# Patient Record
Sex: Female | Born: 1955 | Race: White | Hispanic: No | Marital: Married | Smoking: Never smoker
Health system: Southern US, Community
[De-identification: ages and names within clinical notes are randomized; demographics above are authoritative.]

## PROBLEM LIST (undated history)

## (undated) DIAGNOSIS — E119 Type 2 diabetes mellitus without complications: Secondary | ICD-10-CM

## (undated) DIAGNOSIS — M722 Plantar fascial fibromatosis: Secondary | ICD-10-CM

## (undated) DIAGNOSIS — I10 Essential (primary) hypertension: Secondary | ICD-10-CM

## (undated) DIAGNOSIS — R253 Fasciculation: Secondary | ICD-10-CM

## (undated) DIAGNOSIS — M1990 Unspecified osteoarthritis, unspecified site: Secondary | ICD-10-CM

## (undated) DIAGNOSIS — E785 Hyperlipidemia, unspecified: Secondary | ICD-10-CM

## (undated) DIAGNOSIS — E079 Disorder of thyroid, unspecified: Secondary | ICD-10-CM

## (undated) DIAGNOSIS — N200 Calculus of kidney: Secondary | ICD-10-CM

## (undated) DIAGNOSIS — K219 Gastro-esophageal reflux disease without esophagitis: Secondary | ICD-10-CM

## (undated) DIAGNOSIS — T7840XA Allergy, unspecified, initial encounter: Secondary | ICD-10-CM

## (undated) DIAGNOSIS — F32A Depression, unspecified: Secondary | ICD-10-CM

## (undated) HISTORY — DX: Unspecified osteoarthritis, unspecified site: M19.90

## (undated) HISTORY — DX: Calculus of kidney: N20.0

## (undated) HISTORY — DX: Hyperlipidemia, unspecified: E78.5

## (undated) HISTORY — DX: Fasciculation: R25.3

## (undated) HISTORY — PX: TUBAL LIGATION: SHX77

## (undated) HISTORY — DX: Type 2 diabetes mellitus without complications: E11.9

## (undated) HISTORY — DX: Allergy, unspecified, initial encounter: T78.40XA

## (undated) HISTORY — DX: Gastro-esophageal reflux disease without esophagitis: K21.9

## (undated) HISTORY — DX: Essential (primary) hypertension: I10

## (undated) HISTORY — PX: JOINT REPLACEMENT: SHX530

## (undated) HISTORY — DX: Plantar fascial fibromatosis: M72.2

## (undated) HISTORY — PX: PANNICULECTOMY: SUR1001

## (undated) HISTORY — DX: Depression, unspecified: F32.A

## (undated) HISTORY — DX: Disorder of thyroid, unspecified: E07.9

## (undated) HISTORY — PX: EYE SURGERY: SHX253

---
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
## 1993-01-14 HISTORY — PX: KNEE ARTHROSCOPY W/ MENISCAL REPAIR: SHX1877

## 1994-01-14 HISTORY — PX: BREAST REDUCTION SURGERY: SHX8

## 1994-01-14 HISTORY — PX: REDUCTION MAMMAPLASTY: SUR839

## 2001-01-14 HISTORY — PX: KNEE ARTHROSCOPY W/ MENISCAL REPAIR: SHX1877

## 2010-06-22 DIAGNOSIS — M79673 Pain in unspecified foot: Secondary | ICD-10-CM | POA: Insufficient documentation

## 2012-02-18 DIAGNOSIS — E119 Type 2 diabetes mellitus without complications: Secondary | ICD-10-CM | POA: Insufficient documentation

## 2013-01-11 DIAGNOSIS — M25561 Pain in right knee: Secondary | ICD-10-CM | POA: Insufficient documentation

## 2014-05-10 LAB — HM COLONOSCOPY

## 2017-01-14 HISTORY — PX: REPLACEMENT TOTAL KNEE: SUR1224

## 2017-06-16 DIAGNOSIS — M48061 Spinal stenosis, lumbar region without neurogenic claudication: Secondary | ICD-10-CM | POA: Insufficient documentation

## 2020-02-15 ENCOUNTER — Other Ambulatory Visit: Payer: Self-pay

## 2020-02-15 ENCOUNTER — Encounter: Payer: Self-pay | Admitting: Family Medicine

## 2020-02-15 ENCOUNTER — Ambulatory Visit (INDEPENDENT_AMBULATORY_CARE_PROVIDER_SITE_OTHER): Payer: Medicare HMO | Admitting: Family Medicine

## 2020-02-15 VITALS — BP 102/62 | HR 67 | Temp 96.4°F | Ht 66.0 in | Wt 213.2 lb

## 2020-02-15 DIAGNOSIS — Z1231 Encounter for screening mammogram for malignant neoplasm of breast: Secondary | ICD-10-CM

## 2020-02-15 DIAGNOSIS — E782 Mixed hyperlipidemia: Secondary | ICD-10-CM

## 2020-02-15 DIAGNOSIS — M5136 Other intervertebral disc degeneration, lumbar region: Secondary | ICD-10-CM | POA: Diagnosis not present

## 2020-02-15 DIAGNOSIS — R253 Fasciculation: Secondary | ICD-10-CM | POA: Diagnosis not present

## 2020-02-15 DIAGNOSIS — K219 Gastro-esophageal reflux disease without esophagitis: Secondary | ICD-10-CM | POA: Diagnosis not present

## 2020-02-15 DIAGNOSIS — Z13820 Encounter for screening for osteoporosis: Secondary | ICD-10-CM

## 2020-02-15 DIAGNOSIS — E1159 Type 2 diabetes mellitus with other circulatory complications: Secondary | ICD-10-CM

## 2020-02-15 DIAGNOSIS — H179 Unspecified corneal scar and opacity: Secondary | ICD-10-CM | POA: Diagnosis not present

## 2020-02-15 DIAGNOSIS — Z780 Asymptomatic menopausal state: Secondary | ICD-10-CM

## 2020-02-15 DIAGNOSIS — Z23 Encounter for immunization: Secondary | ICD-10-CM

## 2020-02-15 DIAGNOSIS — R69 Illness, unspecified: Secondary | ICD-10-CM | POA: Diagnosis not present

## 2020-02-15 DIAGNOSIS — E118 Type 2 diabetes mellitus with unspecified complications: Secondary | ICD-10-CM

## 2020-02-15 DIAGNOSIS — F411 Generalized anxiety disorder: Secondary | ICD-10-CM

## 2020-02-15 DIAGNOSIS — I152 Hypertension secondary to endocrine disorders: Secondary | ICD-10-CM

## 2020-02-15 MED ORDER — POTASSIUM CHLORIDE ER 10 MEQ PO TBCR
10.0000 meq | EXTENDED_RELEASE_TABLET | Freq: Once | ORAL | 0 refills | Status: DC
Start: 2020-02-15 — End: 2021-01-12

## 2020-02-15 MED ORDER — GLIMEPIRIDE 1 MG PO TABS: 1.0000 mg | ORAL_TABLET | Freq: Every day | ORAL | 1 refills | Status: DC

## 2020-02-15 MED ORDER — BUSPIRONE HCL 5 MG PO TABS: 5.0000 mg | ORAL_TABLET | Freq: Two times a day (BID) | ORAL | 1 refills | Status: DC

## 2020-02-15 MED ORDER — CLONAZEPAM 0.5 MG PO TABS: 0.5000 mg | ORAL_TABLET | Freq: Every day | ORAL | 0 refills | Status: DC | PRN

## 2020-02-15 MED ORDER — ESCITALOPRAM OXALATE 10 MG PO TABS: 10.0000 mg | ORAL_TABLET | Freq: Every day | ORAL | 1 refills | Status: DC

## 2020-02-15 MED ORDER — CELECOXIB 200 MG PO CAPS: 200.0000 mg | ORAL_CAPSULE | Freq: Two times a day (BID) | ORAL | 1 refills | Status: DC

## 2020-02-15 MED ORDER — GABAPENTIN 100 MG PO CAPS: 200.0000 mg | ORAL_CAPSULE | Freq: Two times a day (BID) | ORAL | 3 refills | Status: DC

## 2020-02-15 NOTE — Patient Instructions (Signed)
[facility].  Ordering Mammogram and Bone Density.

## 2020-02-15 NOTE — Progress Notes (Signed)
New Patient Office Visit  Subjective:  Patient ID: Anna Greene, female    DOB: Jul 24, 1955  Age: 65 y.o. MRN: [id number]  CC:  Chief Complaint  Patient presents with  . Hypertension  . Diabetes  . Depression    HPI Anna Greene presents to establish care.  [place] moved in state. Diabetes: 105-116. A1C 5.7-6.5. Intolerant to metformin. On cinnamon. Glimeperide 1 mg 1/2 daily. Checks feet daily. Needs referral [facility], wants to see an ophthalmologist. Hypertension: On atenolol-chlorthalidone, losartan 50 mg once daily.  DDD - celebrex 200 mg twice daily.  The patient is seeing Dr. Turner Daniels. TKR on right. 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  GAD: Lexapro 10 mg once daily, Buspar 5 mg twice daily, and clonazepam 0.5 as needed. Hyperlipidemia: Rosuvastatin 5 mg once daily The patient's last physical was in 2018.  Her last mammogram was 2019.  She last had a colonoscopy in 2013.  Her last Pap smear was 2018.  She is up-to-date on her Prevnar 40, Fluad 2021, and tetanus 2015.  Past Medical History:  Diagnosis Date  . Benign fasciculation-cramp syndrome   . Depression   . Diabetes mellitus without complication (HCC)   . GERD (gastroesophageal reflux disease)   . Hyperlipidemia   . Hypertension   . Nephrolithiasis   . Osteoarthritis    Bilateral knees, degenerative disc disease.  Patient has had epidural steroid injections x2  . Plantar fasciitis     Past Surgical History:  Procedure Laterality Date  . BREAST REDUCTION SURGERY Bilateral 1996  . CESAREAN SECTION  1976  . CESAREAN SECTION  1979  . KNEE ARTHROSCOPY W/ MENISCAL REPAIR Left 1995   Per patient muscle was repaired  . KNEE ARTHROSCOPY W/ MENISCAL REPAIR Right 2003  . PANNICULECTOMY    . REPLACEMENT TOTAL KNEE Right 2019  . TONSILLECTOMY  1960    Family History  Problem Relation Age of Onset  . Stroke Mother   . Heart disease Mother   . Cancer Father   .  Diabetes Sister   . Stroke Brother   . Anxiety disorder Sister   . Depression Sister   . Diabetes Sister   . Diabetes Sister   . Arthritis Sister     Social History   Socioeconomic History  . Marital status: Married    Spouse name: Not on file  . Number of children: Not on file  . Years of education: Not on file  . Highest education level: Not on file  Occupational History  . Not on file  Tobacco Use  . Smoking status: Never Smoker  . Smokeless tobacco: Never Used  Vaping Use  . Vaping Use: Never used  Substance and Sexual Activity  . Alcohol use: Never  . Drug use: Never  . Sexual activity: Not on file  Other Topics Concern  . Not on file  Social History Narrative   Patient is married.  Has 2 adult children.   Social Determinants of Health   Financial Resource Strain: Not on file  Food Insecurity: Not on file  Transportation Needs: Not on file  Physical Activity: Not on file  Stress: Not on file  Social Connections: Not on file  Intimate Partner Violence: Not on file    ROS Review of Systems  Constitutional: Negative for fatigue.  HENT: Negative for congestion, ear pain and sore throat.   Eyes: Positive for visual disturbance (Blurry vision from scars on her corneas.).  Respiratory: Negative for cough and shortness of breath.   Cardiovascular: Negative for chest pain.  Gastrointestinal: Negative for abdominal pain, constipation, diarrhea, nausea and vomiting.  Genitourinary: Negative for dysuria, frequency and urgency.  Musculoskeletal: Positive for arthralgias, back pain and myalgias.  Neurological: Negative for dizziness and headaches.  Psychiatric/Behavioral: Negative for agitation and sleep disturbance. The patient is nervous/anxious.     Objective:   Today's Vitals: BP 102/62 (BP Location: Right Arm, Patient Position: Sitting, Cuff Size: Normal)   Pulse 67   Temp (!) 96.4 F (35.8 C) (Temporal)   Ht 5\' 6"  (1.676 m)   Wt 213 lb 3.2 oz (96.7 kg)    SpO2 96%   BMI 34.41 kg/m   Physical Exam Vitals reviewed.  Constitutional:      Appearance: Normal appearance.  Cardiovascular:     Rate and Rhythm: Normal rate and regular rhythm.     Heart sounds: Normal heart sounds.  Pulmonary:     Effort: Pulmonary effort is normal.     Breath sounds: Normal breath sounds.  Abdominal:     General: Bowel sounds are normal.     Tenderness: There is no abdominal tenderness.  Musculoskeletal:        General: Normal range of motion.  Skin:    General: Skin is warm and dry.  Neurological:     Mental Status: She is alert and oriented to person, place, and time.  Psychiatric:        Mood and Affect: Mood normal.        Behavior: Behavior normal.     Assessment & Plan:  1. Controlled type 2 diabetes mellitus with complication, without long-term current use of insulin (HCC) Control: good Recommend check sugars fasting daily. Recommend check feet daily. Recommend annual eye exams. Medicines: no changes at this time.  Continue to work on eating a healthy diet and exercise.  Labs to be drawn next week. - Ambulatory referral to Ophthalmology - Hemoglobin A1c; Future  2. GAD (generalized anxiety disorder) Continue current medications.  Well-controlled - clonazePAM (KLONOPIN) 0.5 MG tablet; Take 1 tablet (0.5 mg total) by mouth daily as needed.  Dispense: 90 tablet; Refill: 0 - busPIRone (BUSPAR) 5 MG tablet; Take 1 tablet (5 mg total) by mouth 2 (two) times daily.  Dispense: 180 tablet; Refill: 1 - escitalopram (LEXAPRO) 10 MG tablet; Take 1 tablet (10 mg total) by mouth daily.  Dispense: 90 tablet; Refill: 1  3. Benign fasciculations Continue current medications. - gabapentin (NEURONTIN) 100 MG capsule; Take 2 capsules (200 mg total) by mouth 2 (two) times daily.  Dispense: 360 capsule; Refill: 3  4. Hypertension associated with type 2 diabetes mellitus (HCC) Blood pressure appears to be well controlled on current medications.  No changes.   Continue eating healthy and exercise as able. - atenolol-chlorthalidone (TENORETIC) 100-25 MG tablet; Take 1 tablet by mouth daily. - losartan (COZAAR) 50 MG tablet; Take by mouth. - potassium chloride (KLOR-CON) 10 MEQ tablet; Take 1 tablet (10 mEq total) by mouth once for 1 dose.  Dispense: 1 tablet; Refill: 0 - glimepiride (AMARYL) 1 MG tablet; Take 1 tablet (1 mg total) by mouth daily with breakfast.  Dispense: 90 tablet; Refill: 1 - CBC with Differential/Platelet; Future - Comprehensive metabolic panel; Future - TSH; Future  5. Mixed hyperlipidemia For now continue current medications.  Will be checking blood work this coming week and will consider changes if necessary. - rosuvastatin (CRESTOR) 5 MG tablet; Take by mouth. - Lipid panel;  Future  6. Gastroesophageal reflux disease without esophagitis Well-controlled on omeprazole.  No changes. - omeprazole (PRILOSEC) 20 MG capsule; Take 20 mg by mouth daily.  7. Corneal scarring Refer to [facility]. - Ambulatory referral to Ophthalmology  8. Degenerative disc disease, lumbar Continue Celebrex. - celecoxib (CELEBREX) 200 MG capsule; Take 1 capsule (200 mg total) by mouth 2 (two) times daily.  Dispense: 180 capsule; Refill: 1  9. Encounter for osteoporosis screening in asymptomatic postmenopausal patient Order bone density.  10. Need for vaccination against Streptococcus pneumoniae - Pneumococcal polysaccharide vaccine 23-valent greater than or equal to 2yo subcutaneous/IM  11. Visit for screening mammogram Order mammogram    Outpatient Encounter Medications as of 02/15/2020  Medication Sig  . atenolol-chlorthalidone (TENORETIC) 100-25 MG tablet Take 1 tablet by mouth daily.  Marland Kitchen loratadine (CLARITIN) 10 MG tablet Take by mouth.  . losartan (COZAAR) 50 MG tablet Take by mouth.  Marland Kitchen omeprazole (PRILOSEC) 20 MG capsule Take 20 mg by mouth daily.  . rosuvastatin (CRESTOR) 5 MG tablet Take by mouth.  . [DISCONTINUED] busPIRone  (BUSPAR) 5 MG tablet Take by mouth.  . [DISCONTINUED] celecoxib (CELEBREX) 200 MG capsule Take by mouth.  . [DISCONTINUED] clonazePAM (KLONOPIN) 0.5 MG tablet Take 1 tablet by mouth as needed.  . [DISCONTINUED] escitalopram (LEXAPRO) 10 MG tablet Take by mouth.  . [DISCONTINUED] gabapentin (NEURONTIN) 100 MG capsule Take by mouth.  . [DISCONTINUED] glimepiride (AMARYL) 1 MG tablet Take by mouth.  . [DISCONTINUED] potassium chloride (KLOR-CON) 10 MEQ tablet Take by mouth.  . busPIRone (BUSPAR) 5 MG tablet Take 1 tablet (5 mg total) by mouth 2 (two) times daily.  . celecoxib (CELEBREX) 200 MG capsule Take 1 capsule (200 mg total) by mouth 2 (two) times daily.  . clonazePAM (KLONOPIN) 0.5 MG tablet Take 1 tablet (0.5 mg total) by mouth daily as needed.  Marland Kitchen escitalopram (LEXAPRO) 10 MG tablet Take 1 tablet (10 mg total) by mouth daily.  Marland Kitchen gabapentin (NEURONTIN) 100 MG capsule Take 2 capsules (200 mg total) by mouth 2 (two) times daily.  Marland Kitchen glimepiride (AMARYL) 1 MG tablet Take 1 tablet (1 mg total) by mouth daily with breakfast.  . potassium chloride (KLOR-CON) 10 MEQ tablet Take 1 tablet (10 mEq total) by mouth once for 1 dose.   No facility-administered encounter medications on file as of 02/15/2020.    Follow-up: No follow-ups on file.   Blane Ohara, MD

## 2020-02-16 ENCOUNTER — Other Ambulatory Visit: Payer: Self-pay | Admitting: Family Medicine

## 2020-02-16 DIAGNOSIS — Z780 Asymptomatic menopausal state: Secondary | ICD-10-CM

## 2020-02-16 DIAGNOSIS — Z13820 Encounter for screening for osteoporosis: Secondary | ICD-10-CM

## 2020-02-16 DIAGNOSIS — Z1231 Encounter for screening mammogram for malignant neoplasm of breast: Secondary | ICD-10-CM

## 2020-02-18 DIAGNOSIS — M810 Age-related osteoporosis without current pathological fracture: Secondary | ICD-10-CM | POA: Insufficient documentation

## 2020-02-18 DIAGNOSIS — E785 Hyperlipidemia, unspecified: Secondary | ICD-10-CM | POA: Insufficient documentation

## 2020-02-18 DIAGNOSIS — I10 Essential (primary) hypertension: Secondary | ICD-10-CM | POA: Insufficient documentation

## 2020-02-18 DIAGNOSIS — F418 Other specified anxiety disorders: Secondary | ICD-10-CM | POA: Insufficient documentation

## 2020-02-18 DIAGNOSIS — I152 Hypertension secondary to endocrine disorders: Secondary | ICD-10-CM | POA: Insufficient documentation

## 2020-02-18 DIAGNOSIS — E1159 Type 2 diabetes mellitus with other circulatory complications: Secondary | ICD-10-CM | POA: Insufficient documentation

## 2020-02-18 DIAGNOSIS — E119 Type 2 diabetes mellitus without complications: Secondary | ICD-10-CM | POA: Insufficient documentation

## 2020-02-18 DIAGNOSIS — E1169 Type 2 diabetes mellitus with other specified complication: Secondary | ICD-10-CM | POA: Insufficient documentation

## 2020-02-20 ENCOUNTER — Encounter: Payer: Self-pay | Admitting: Family Medicine

## 2020-02-21 ENCOUNTER — Other Ambulatory Visit: Payer: Self-pay

## 2020-02-21 ENCOUNTER — Other Ambulatory Visit: Payer: Medicare HMO

## 2020-02-21 DIAGNOSIS — E1159 Type 2 diabetes mellitus with other circulatory complications: Secondary | ICD-10-CM

## 2020-02-21 DIAGNOSIS — I152 Hypertension secondary to endocrine disorders: Secondary | ICD-10-CM | POA: Diagnosis not present

## 2020-02-21 DIAGNOSIS — E782 Mixed hyperlipidemia: Secondary | ICD-10-CM

## 2020-02-21 DIAGNOSIS — E118 Type 2 diabetes mellitus with unspecified complications: Secondary | ICD-10-CM | POA: Diagnosis not present

## 2020-02-22 LAB — CBC WITH DIFF/PLATELET
Basophils Absolute: 0.1 10*3/uL (ref 0.0–0.2)
Basos: 1 %
EOS (ABSOLUTE): 0.3 10*3/uL (ref 0.0–0.4)
Eos: 4 %
Hematocrit: 41.1 % (ref 34.0–46.6)
Hemoglobin: 13.6 g/dL (ref 11.1–15.9)
Immature Grans (Abs): 0 10*3/uL (ref 0.0–0.1)
Immature Granulocytes: 0 %
Lymphocytes Absolute: 2 10*3/uL (ref 0.7–3.1)
Lymphs: 32 %
MCH: 28.4 pg (ref 26.6–33.0)
MCHC: 33.1 g/dL (ref 31.5–35.7)
MCV: 86 fL (ref 79–97)
Monocytes Absolute: 0.6 10*3/uL (ref 0.1–0.9)
Monocytes: 9 %
Neutrophils Absolute: 3.3 10*3/uL (ref 1.4–7.0)
Neutrophils: 54 %
Platelets: 272 10*3/uL (ref 150–450)
RBC: 4.79 x10E6/uL (ref 3.77–5.28)
RDW: 13.2 % (ref 11.7–15.4)
WBC: 6.2 10*3/uL (ref 3.4–10.8)

## 2020-02-22 LAB — LIPID PANEL
Chol/HDL Ratio: 2.7 ratio (ref 0.0–4.4)
Cholesterol, Total: 154 mg/dL (ref 100–199)
HDL: 58 mg/dL (ref >39)
LDL Chol Calc (NIH): 73 mg/dL (ref 0–99)
Triglycerides: 131 mg/dL (ref 0–149)
VLDL Cholesterol Cal: 23 mg/dL (ref 5–40)

## 2020-02-22 LAB — TSH: TSH: 0.948 u[IU]/mL (ref 0.450–4.500)

## 2020-02-22 LAB — HEMOGLOBIN A1C
Est. average glucose Bld gHb Est-mCnc: 126 mg/dL
Hgb A1c MFr Bld: 6 % — ABNORMAL HIGH (ref 4.8–5.6)

## 2020-02-22 LAB — COMPREHENSIVE METABOLIC PANEL
ALT: 17 IU/L (ref 0–32)
AST: 23 IU/L (ref 0–40)
Albumin/Globulin Ratio: 2 (ref 1.2–2.2)
Albumin: 4.5 g/dL (ref 3.8–4.8)
Alkaline Phosphatase: 89 IU/L (ref 44–121)
BUN/Creatinine Ratio: 23 (ref 12–28)
BUN: 19 mg/dL (ref 8–27)
Bilirubin Total: 0.4 mg/dL (ref 0.0–1.2)
CO2: 27 mmol/L (ref 20–29)
Calcium: 9.5 mg/dL (ref 8.7–10.3)
Chloride: 96 mmol/L (ref 96–106)
Creatinine, Ser: 0.83 mg/dL (ref 0.57–1.00)
GFR calc Af Amer: 85 mL/min/{1.73_m2} (ref >59)
GFR calc non Af Amer: 74 mL/min/{1.73_m2} (ref >59)
Globulin, Total: 2.3 g/dL (ref 1.5–4.5)
Glucose: 110 mg/dL — ABNORMAL HIGH (ref 65–99)
Potassium: 3.8 mmol/L (ref 3.5–5.2)
Sodium: 139 mmol/L (ref 134–144)
Total Protein: 6.8 g/dL (ref 6.0–8.5)

## 2020-02-22 LAB — CARDIOVASCULAR RISK ASSESSMENT

## 2020-04-10 ENCOUNTER — Ambulatory Visit
Admission: RE | Admit: 2020-04-10 | Discharge: 2020-04-10 | Disposition: A | Discharge status: Home / Self Care | Payer: Medicare HMO | Source: Ambulatory Visit | Attending: Family Medicine | Admitting: Family Medicine

## 2020-04-10 ENCOUNTER — Other Ambulatory Visit: Payer: Self-pay

## 2020-04-10 DIAGNOSIS — Z1231 Encounter for screening mammogram for malignant neoplasm of breast: Secondary | ICD-10-CM | POA: Diagnosis not present

## 2020-04-17 ENCOUNTER — Ambulatory Visit: Payer: Medicare HMO | Admitting: Family Medicine

## 2020-04-27 NOTE — Progress Notes (Signed)
Subjective:  Patient ID: Anna Greene, female    DOB: 12/20/1955  Age: 65 y.o. MRN: [id number]  Chief Complaint  Patient presents with  . Annual Exam    HPI Encounter for general adult medical examination without abnormal findings  Physical ("At Risk" items are starred): Patient's last physical exam was 1 year ago .  Smoking: Life-long non-smoker ;  Physical Activity: Exercises at least 3 times per week ; Walking and riding stationary bike Alcohol/Drug Use: Is a non-drinker ; No illicit drug use ;  Patient is not afflicted from Stress Incontinence and Urge Incontinence  Safety: reviewed ; Patient wears a seat belt, has smoke detectors, has carbon monoxide detectors, practices appropriate gun safety, and wears sunscreen with extended sun exposure. Dental Care: biannual cleanings, brushes and flosses daily. Needs to get established with a dentist.  Ophthalmology/Optometry: Annual visit. Has appt today with Dr. Cecile Hearing. Hearing loss: none Vision impairments: none  DEXA- scheduled for 06/2020   Mammogram- 03/2020-Normal  Fall Risk  05/01/2020 05/01/2020  Falls in the past year? - 0  Number falls in past yr: - 0  Injury with Fall? - 0  Risk for fall due to : No Fall Risks No Fall Risks  Follow up Falls evaluation completed;Falls prevention discussed Falls evaluation completed     Depression screen [facility] 2/9 05/01/2020 02/20/2020 02/15/2020  Decreased Interest 0 - 0  Down, Depressed, Hopeless 0 - 0  PHQ - 2 Score 0 - 0  Difficult doing work/chores - Not difficult at all -       Functional Status Survey: Is the patient deaf or have difficulty hearing?: No Does the patient have difficulty seeing, even when wearing glasses/contacts?: No Does the patient have difficulty concentrating, remembering, or making decisions?: No Does the patient have difficulty walking or climbing stairs?: No Does the patient have difficulty dressing or bathing?: No Does the patient have difficulty doing errands  alone such as visiting a doctor's office or shopping?: No   Social Hx   Social History   Socioeconomic History  . Marital status: Married    Spouse name: Not on file  . Number of children: Not on file  . Years of education: Not on file  . Highest education level: Not on file  Occupational History  . Not on file  Tobacco Use  . Smoking status: Never Smoker  . Smokeless tobacco: Never Used  Vaping Use  . Vaping Use: Never used  Substance and Sexual Activity  . Alcohol use: Never  . Drug use: Never  . Sexual activity: Not on file  Other Topics Concern  . Not on file  Social History Narrative   Patient is married.  Has 2 adult children.   Social Determinants of Health   Financial Resource Strain: Not on file  Food Insecurity: Not on file  Transportation Needs: Not on file  Physical Activity: Not on file  Stress: Not on file  Social Connections: Not on file   Past Medical History:  Diagnosis Date  . Benign fasciculation-cramp syndrome   . Depression   . Diabetes mellitus without complication (HCC)   . GERD (gastroesophageal reflux disease)   . Hyperlipidemia   . Hypertension   . Nephrolithiasis   . Osteoarthritis    Bilateral knees, degenerative disc disease.  Patient has had epidural steroid injections x2  . Plantar fasciitis    Family History  Problem Relation Age of Onset  . Stroke Mother   . Heart disease Mother   .  Cancer Father   . Diabetes Sister   . Stroke Brother   . Anxiety disorder Sister   . Depression Sister   . Diabetes Sister   . Diabetes Sister   . Arthritis Sister     Review of Systems  Constitutional: Negative for chills, fatigue and fever.  HENT: Negative for congestion, ear pain, rhinorrhea and sore throat.   Respiratory: Negative for cough and shortness of breath.   Cardiovascular: Negative for chest pain.  Gastrointestinal: Negative for abdominal pain, constipation, diarrhea, nausea and vomiting.  Genitourinary: Negative for  dysuria and urgency.  Musculoskeletal: Negative for back pain and myalgias.  Neurological: Negative for dizziness, weakness, light-headedness and headaches.  Psychiatric/Behavioral: Negative for dysphoric mood. The patient is not nervous/anxious.      Objective:  BP 122/72   Pulse 68   Temp (!) 97.2 F (36.2 C)   Ht 5\' 6"  (1.676 m)   Wt 218 lb (98.9 kg)   SpO2 97%   BMI 35.19 kg/m   BP/Weight 05/01/2020 02/15/2020  Systolic BP 122 102  Diastolic BP 72 62  Wt. (Lbs) 218 213.2  BMI 35.19 34.41    Physical Exam Vitals reviewed. Exam conducted with a chaperone present.  Constitutional:      Appearance: Normal appearance. She is normal weight.  Neck:     Vascular: No carotid bruit.  Cardiovascular:     Rate and Rhythm: Normal rate and regular rhythm.     Pulses: Normal pulses.     Heart sounds: Normal heart sounds.  Pulmonary:     Effort: Pulmonary effort is normal. No respiratory distress.     Breath sounds: Normal breath sounds.  Abdominal:     General: Abdomen is flat. Bowel sounds are normal.     Palpations: Abdomen is soft.     Tenderness: There is no abdominal tenderness.  Genitourinary:    General: Normal vulva.     Exam position: Lithotomy position.     Vagina: Lesions (difuse mild atrophy. ) present.     Cervix: Normal.  Skin:    Findings: No lesion or rash.  Neurological:     Mental Status: She is alert and oriented to person, place, and time.  Psychiatric:        Mood and Affect: Mood normal.        Behavior: Behavior normal.     Lab Results  Component Value Date   WBC 6.2 02/21/2020   HGB 13.6 02/21/2020   HCT 41.1 02/21/2020   PLT 272 02/21/2020   GLUCOSE 110 (H) 02/21/2020   CHOL 154 02/21/2020   TRIG 131 02/21/2020   HDL 58 02/21/2020   LDLCALC 73 02/21/2020   ALT 17 02/21/2020   AST 23 02/21/2020   NA 139 02/21/2020   K 3.8 02/21/2020   CL 96 02/21/2020   CREATININE 0.83 02/21/2020   BUN 19 02/21/2020   CO2 27 02/21/2020   TSH 0.948  02/21/2020   HGBA1C 6.0 (H) 02/21/2020      Assessment & Plan:  1. General medical exam Healthy female.  The current medical regimen is effective;  continue present plan and medications. Recommend continue to work on eating healthy diet and exercise.  2. Cervical cancer screening - IGP, Aptima HPV, rfx 16/18,45  3. Mixed hyperlipidemia - Lipid panel; Future  4. Hypertension associated with type 2 diabetes mellitus (HCC) - CBC with Differential/Platelet; Future - Comprehensive metabolic panel; Future - Hemoglobin A1c; Future    Meds ordered this encounter  Medications  . conjugated estrogens (PREMARIN) vaginal cream    Sig: Place 1 Applicatorful vaginally daily for 14 days, THEN 1 Applicatorful every 3 (three) days for 14 days.    Dispense:  42.5 g    Refill:  12    These are the goals we discussed: Goals   None      This is a list of the screening recommended for you and due dates:  Health Maintenance  Topic Date Due  .  Hepatitis C: One time screening is recommended by Center for Disease Control  (CDC) for  adults born from [birth year] through 1967.   Never done  . Eye exam for diabetics  Never done  . Colon Cancer Screening  Never done  . DEXA scan (bone density measurement)  Never done  . Flu Shot  08/14/2020  . Hemoglobin A1C  08/20/2020  . Complete foot exam   02/14/2021  . Mammogram  04/11/2022  . Tetanus Vaccine  02/16/2023  . COVID-19 Vaccine  Completed  . Pneumonia vaccines  Completed  . HPV Vaccine  Aged Out     AN INDIVIDUALIZED CARE PLAN: was established or reinforced today.   SELF MANAGEMENT: The patient and I together assessed ways to personally work towards obtaining the recommended goals  Support needs The patient and/or family needs were assessed and services were offered and not necessary at this time.    Follow-up: Return in about 6 months (around 10/31/2020).  Blane Ohara, MD [facility] [phone]

## 2020-05-01 ENCOUNTER — Other Ambulatory Visit: Payer: Self-pay

## 2020-05-01 ENCOUNTER — Encounter: Payer: Self-pay | Admitting: Family Medicine

## 2020-05-01 ENCOUNTER — Ambulatory Visit (INDEPENDENT_AMBULATORY_CARE_PROVIDER_SITE_OTHER): Payer: Medicare HMO | Admitting: Family Medicine

## 2020-05-01 VITALS — BP 122/72 | HR 68 | Temp 97.2°F | Ht 66.0 in | Wt 218.0 lb

## 2020-05-01 DIAGNOSIS — M545 Low back pain, unspecified: Secondary | ICD-10-CM | POA: Insufficient documentation

## 2020-05-01 DIAGNOSIS — E782 Mixed hyperlipidemia: Secondary | ICD-10-CM

## 2020-05-01 DIAGNOSIS — Z01818 Encounter for other preprocedural examination: Secondary | ICD-10-CM | POA: Diagnosis not present

## 2020-05-01 DIAGNOSIS — M5136 Other intervertebral disc degeneration, lumbar region: Secondary | ICD-10-CM | POA: Insufficient documentation

## 2020-05-01 DIAGNOSIS — H11001 Unspecified pterygium of right eye: Secondary | ICD-10-CM | POA: Diagnosis not present

## 2020-05-01 DIAGNOSIS — H18453 Nodular corneal degeneration, bilateral: Secondary | ICD-10-CM | POA: Diagnosis not present

## 2020-05-01 DIAGNOSIS — M5416 Radiculopathy, lumbar region: Secondary | ICD-10-CM | POA: Insufficient documentation

## 2020-05-01 DIAGNOSIS — M179 Osteoarthritis of knee, unspecified: Secondary | ICD-10-CM | POA: Insufficient documentation

## 2020-05-01 DIAGNOSIS — M1991 Primary osteoarthritis, unspecified site: Secondary | ICD-10-CM | POA: Insufficient documentation

## 2020-05-01 DIAGNOSIS — Z124 Encounter for screening for malignant neoplasm of cervix: Secondary | ICD-10-CM | POA: Diagnosis not present

## 2020-05-01 DIAGNOSIS — E119 Type 2 diabetes mellitus without complications: Secondary | ICD-10-CM | POA: Diagnosis not present

## 2020-05-01 DIAGNOSIS — E1159 Type 2 diabetes mellitus with other circulatory complications: Secondary | ICD-10-CM

## 2020-05-01 DIAGNOSIS — I152 Hypertension secondary to endocrine disorders: Secondary | ICD-10-CM | POA: Diagnosis not present

## 2020-05-01 DIAGNOSIS — M1710 Unilateral primary osteoarthritis, unspecified knee: Secondary | ICD-10-CM | POA: Insufficient documentation

## 2020-05-01 DIAGNOSIS — Z0000 Encounter for general adult medical examination without abnormal findings: Secondary | ICD-10-CM | POA: Diagnosis not present

## 2020-05-01 DIAGNOSIS — H2513 Age-related nuclear cataract, bilateral: Secondary | ICD-10-CM | POA: Diagnosis not present

## 2020-05-01 MED ORDER — PREMARIN 0.625 MG/GM VA CREA: TOPICAL_CREAM | VAGINAL | 12 refills | Status: AC

## 2020-05-01 NOTE — Patient Instructions (Addendum)
[facility] recommended. Recommend continue to work on eating healthy diet and exercise. Obtain Living well or [place] of attorney.  Premarin cream apply once daily x 2 weeks then decrease to 1-2 times per week.   Preventive Care 33 Years and Older, Female Preventive care refers to lifestyle choices and visits with your health care provider that can promote health and wellness. This includes:  A yearly physical exam. This is also called an annual wellness visit.  Regular dental and eye exams.  Immunizations.  Screening for certain conditions.  Healthy lifestyle choices, such as: ? Eating a healthy diet. ? Getting regular exercise. ? Not using drugs or products that contain nicotine and tobacco. ? Limiting alcohol use. What can I expect for my preventive care visit? Physical exam Your health care provider will check your:  Height and weight. These may be used to calculate your BMI (body mass index). BMI is a measurement that tells if you are at a healthy weight.  Heart rate and blood pressure.  Body temperature.  Skin for abnormal spots. Counseling Your health care provider may ask you questions about your:  Past medical problems.  Family's medical history.  Alcohol, tobacco, and drug use.  Emotional well-being.  Home life and relationship well-being.  Sexual activity.  Diet, exercise, and sleep habits.  History of falls.  Memory and ability to understand (cognition).  Work and work Statistician.  Pregnancy and menstrual history.  Access to firearms. What immunizations do I need? Vaccines are usually given at various ages, according to a schedule. Your health care provider will recommend vaccines for you based on your age, medical history, and lifestyle or other factors, such as travel or where you work.   What tests do I need? Blood tests  Lipid and cholesterol levels. These may be checked every 5 years, or more often depending on your  overall health.  Hepatitis C test.  Hepatitis B test. Screening  Lung cancer screening. You may have this screening every year starting at age 44 if you have a 30-pack-year history of smoking and currently smoke or have quit within the past 15 years.  Colorectal cancer screening. ? All adults should have this screening starting at age 57 and continuing until age 70. ? Your health care provider may recommend screening at age 62 if you are at increased risk. ? You will have tests every 1-10 years, depending on your results and the type of screening test.  Diabetes screening. ? This is done by checking your blood sugar (glucose) after you have not eaten for a while (fasting). ? You may have this done every 1-3 years.  Mammogram. ? This may be done every 1-2 years. ? Talk with your health care provider about how often you should have regular mammograms.  Abdominal aortic aneurysm (AAA) screening. You may need this if you are a current or former smoker.  BRCA-related cancer screening. This may be done if you have a family history of breast, ovarian, tubal, or peritoneal cancers. Other tests  STD (sexually transmitted disease) testing, if you are at risk.  Bone density scan. This is done to screen for osteoporosis. You may have this done starting at age 64. Talk with your health care provider about your test results, treatment options, and if necessary, the need for more tests. Follow these instructions at home: Eating and drinking  Eat a diet that includes fresh fruits and vegetables, whole grains, lean protein, and low-fat dairy products. Limit your intake  of foods with high amounts of sugar, saturated fats, and salt.  Take vitamin and mineral supplements as recommended by your health care provider.  Do not drink alcohol if your health care provider tells you not to drink.  If you drink alcohol: ? Limit how much you have to 0-1 drink a day. ? Be aware of how much alcohol is in  your drink. In the [place] one drink equals one 12 oz bottle of beer (355 mL), one 5 oz glass of wine (148 mL), or one 1 oz glass of hard liquor (44 mL).   Lifestyle  Take daily care of your teeth and gums. Brush your teeth every morning and night with fluoride toothpaste. Floss one time each day.  Stay active. Exercise for at least 30 minutes 5 or more days each week.  Do not use any products that contain nicotine or tobacco, such as cigarettes, e-cigarettes, and chewing tobacco. If you need help quitting, ask your health care provider.  Do not use drugs.  If you are sexually active, practice safe sex. Use a condom or other form of protection in order to prevent STIs (sexually transmitted infections).  Talk with your health care provider about taking a low-dose aspirin or statin.  Find healthy ways to cope with stress, such as: ? Meditation, yoga, or listening to music. ? Journaling. ? Talking to a trusted person. ? Spending time with friends and family. Safety  Always wear your seat belt while driving or riding in a vehicle.  Do not drive: ? If you have been drinking alcohol. Do not ride with someone who has been drinking. ? When you are tired or distracted. ? While texting.  Wear a helmet and other protective equipment during sports activities.  If you have firearms in your house, make sure you follow all gun safety procedures. What's next?  Visit your health care provider once a year for an annual wellness visit.  Ask your health care provider how often you should have your eyes and teeth checked.  Stay up to date on all vaccines. This information is not intended to replace advice given to you by your health care provider. Make sure you discuss any questions you have with your health care provider. Document Revised: 12/22/2019 Document Reviewed: 12/25/2017 Elsevier Patient Education  2021 [employer].

## 2020-05-03 LAB — IGP, APTIMA HPV, RFX 16/18,45
HPV Aptima: NEGATIVE
PAP Smear Comment: 0

## 2020-05-16 ENCOUNTER — Other Ambulatory Visit: Payer: Self-pay | Admitting: Family Medicine

## 2020-05-16 DIAGNOSIS — F411 Generalized anxiety disorder: Secondary | ICD-10-CM

## 2020-06-01 DIAGNOSIS — H11001 Unspecified pterygium of right eye: Secondary | ICD-10-CM | POA: Diagnosis not present

## 2020-06-01 DIAGNOSIS — H18451 Nodular corneal degeneration, right eye: Secondary | ICD-10-CM | POA: Diagnosis not present

## 2020-06-01 DIAGNOSIS — E119 Type 2 diabetes mellitus without complications: Secondary | ICD-10-CM | POA: Diagnosis not present

## 2020-07-10 ENCOUNTER — Other Ambulatory Visit: Payer: Self-pay

## 2020-07-10 ENCOUNTER — Ambulatory Visit
Admission: RE | Admit: 2020-07-10 | Discharge: 2020-07-10 | Disposition: A | Discharge status: Home / Self Care | Payer: Medicare HMO | Source: Ambulatory Visit | Attending: Family Medicine | Admitting: Family Medicine

## 2020-07-10 DIAGNOSIS — Z780 Asymptomatic menopausal state: Secondary | ICD-10-CM | POA: Diagnosis not present

## 2020-07-24 DIAGNOSIS — H524 Presbyopia: Secondary | ICD-10-CM | POA: Diagnosis not present

## 2020-07-24 DIAGNOSIS — Z0100 Encounter for examination of eyes and vision without abnormal findings: Secondary | ICD-10-CM | POA: Diagnosis not present

## 2020-07-24 LAB — HM DIABETES EYE EXAM

## 2020-08-01 ENCOUNTER — Ambulatory Visit (INDEPENDENT_AMBULATORY_CARE_PROVIDER_SITE_OTHER): Payer: Medicare HMO | Admitting: Family Medicine

## 2020-08-01 ENCOUNTER — Encounter: Payer: Self-pay | Admitting: Family Medicine

## 2020-08-01 ENCOUNTER — Other Ambulatory Visit: Payer: Self-pay

## 2020-08-01 VITALS — BP 110/56 | HR 72 | Temp 97.3°F | Ht 66.0 in | Wt 222.0 lb

## 2020-08-01 DIAGNOSIS — M5431 Sciatica, right side: Secondary | ICD-10-CM | POA: Diagnosis not present

## 2020-08-01 DIAGNOSIS — M25571 Pain in right ankle and joints of right foot: Secondary | ICD-10-CM | POA: Diagnosis not present

## 2020-08-01 MED ORDER — PREDNISONE 50 MG PO TABS: 50.0000 mg | ORAL_TABLET | Freq: Every day | ORAL | 0 refills | Status: DC

## 2020-08-01 MED ORDER — DICLOFENAC SODIUM 50 MG PO TBEC: 50.0000 mg | DELAYED_RELEASE_TABLET | Freq: Two times a day (BID) | ORAL | 0 refills | Status: DC

## 2020-08-01 NOTE — Progress Notes (Signed)
Acute Office Visit  Subjective:    Patient ID: Anna Greene, female    DOB: [birth year], 65 y.o.   MRN: [id number]  Chief Complaint  Patient presents with   Back Pain    HPI Patient is in today for back pain states pain is radiating down her right leg due to L4 and L5 pinching her sciatic nerve x few weeks. Preferred to be seen here vs driving to [place].  Past Medical History:  Diagnosis Date   Benign fasciculation-cramp syndrome    Depression    Diabetes mellitus without complication (HCC)    GERD (gastroesophageal reflux disease)    Hyperlipidemia    Hypertension    Nephrolithiasis    Osteoarthritis    Bilateral knees, degenerative disc disease.  Patient has had epidural steroid injections x2   Plantar fasciitis     Past Surgical History:  Procedure Laterality Date   BREAST REDUCTION SURGERY Bilateral 1996   CESAREAN SECTION  1976   CESAREAN SECTION  1979   KNEE ARTHROSCOPY W/ MENISCAL REPAIR Left 1995   Per patient muscle was repaired   KNEE ARTHROSCOPY W/ MENISCAL REPAIR Right 2003   PANNICULECTOMY     REDUCTION MAMMAPLASTY  1996   REPLACEMENT TOTAL KNEE Right 2019   TONSILLECTOMY  1960    Family History  Problem Relation Age of Onset   Stroke Mother    Heart disease Mother    Cancer Father    Diabetes Sister    Stroke Brother    Anxiety disorder Sister    Depression Sister    Diabetes Sister    Diabetes Sister    Arthritis Sister     Social History   Socioeconomic History   Marital status: Married    Spouse name: Not on file   Number of children: Not on file   Years of education: Not on file   Highest education level: Not on file  Occupational History   Not on file  Tobacco Use   Smoking status: Never   Smokeless tobacco: Never  Vaping Use   Vaping Use: Never used  Substance and Sexual Activity   Alcohol use: Never   Drug use: Never   Sexual activity: Not on file  Other Topics Concern   Not on file  Social History Narrative    Patient is married.  Has 2 adult children.   Social Determinants of Health   Financial Resource Strain: Not on file  Food Insecurity: Not on file  Transportation Needs: Not on file  Physical Activity: Not on file  Stress: Not on file  Social Connections: Not on file  Intimate Partner Violence: Not on file    Outpatient Medications Prior to Visit  Medication Sig Dispense Refill   atenolol-chlorthalidone (TENORETIC) 100-25 MG tablet Take 1 tablet by mouth daily.     celecoxib (CELEBREX) 200 MG capsule Take 1 capsule (200 mg total) by mouth 2 (two) times daily. 180 capsule 1   clonazePAM (KLONOPIN) 0.5 MG tablet TAKE 1 TABLET BY MOUTH ONCE DAILY AS NEEDED 30 tablet 5   gabapentin (NEURONTIN) 100 MG capsule Take 2 capsules (200 mg total) by mouth 2 (two) times daily. 360 capsule 3   glimepiride (AMARYL) 1 MG tablet Take 1 tablet (1 mg total) by mouth daily with breakfast. 90 tablet 1   loratadine (CLARITIN) 10 MG tablet Take by mouth.     losartan (COZAAR) 50 MG tablet Take by mouth.     omeprazole (PRILOSEC) 20 MG capsule Take  20 mg by mouth daily.     potassium chloride (KLOR-CON) 10 MEQ tablet Take 1 tablet (10 mEq total) by mouth once for 1 dose. 1 tablet 0   rosuvastatin (CRESTOR) 5 MG tablet Take by mouth.     busPIRone (BUSPAR) 5 MG tablet Take 1 tablet (5 mg total) by mouth 2 (two) times daily. 180 tablet 1   escitalopram (LEXAPRO) 10 MG tablet Take 1 tablet (10 mg total) by mouth daily. 90 tablet 1   No facility-administered medications prior to visit.    Allergies  Allergen Reactions   Metformin And Related    Ramipril    Tuberculin     Review of Systems  Constitutional:  Negative for chills, fatigue and fever.  HENT:  Negative for congestion, ear pain, rhinorrhea and sore throat.   Respiratory:  Negative for cough and shortness of breath.   Cardiovascular:  Negative for chest pain.  Gastrointestinal:  Negative for abdominal pain, constipation, diarrhea, nausea and  vomiting.  Genitourinary:  Negative for dysuria and urgency.  Musculoskeletal:  Positive for arthralgias, back pain and myalgias.  Neurological:  Negative for dizziness, weakness, light-headedness and headaches.  Psychiatric/Behavioral:  Negative for dysphoric mood. The patient is not nervous/anxious.       Objective:    Physical Exam Vitals reviewed.  Constitutional:      Appearance: Normal appearance.  Cardiovascular:     Rate and Rhythm: Normal rate and regular rhythm.     Heart sounds: Normal heart sounds.  Pulmonary:     Effort: Pulmonary effort is normal.     Breath sounds: Normal breath sounds.  Musculoskeletal:        General: Tenderness (rt buttock. positive rt SLR. rt ankle edema on lateral side. rt heel tender.) present.  Neurological:     Mental Status: She is alert and oriented to person, place, and time.  Psychiatric:        Mood and Affect: Mood normal.        Behavior: Behavior normal.    BP (!) 110/56   Pulse 72   Temp (!) 97.3 F (36.3 C)   Ht 5\' 6"  (1.676 m)   Wt 222 lb (100.7 kg)   SpO2 98%   BMI 35.83 kg/m  Wt Readings from Last 3 Encounters:  08/01/20 222 lb (100.7 kg)  05/01/20 218 lb (98.9 kg)  02/15/20 213 lb 3.2 oz (96.7 kg)    Health Maintenance Due  Topic Date Due   OPHTHALMOLOGY EXAM  Never done   Hepatitis C Screening  Never done   COLONOSCOPY (Pts 45-91yrs Insurance coverage will need to be confirmed)  Never done   Zoster Vaccines- Shingrix (1 of 2) Never done   COVID-19 Vaccine (4 - Booster for Moderna series) 03/14/2020    There are no preventive care reminders to display for this patient.   Lab Results  Component Value Date   TSH 0.948 02/21/2020   Lab Results  Component Value Date   WBC 6.2 02/21/2020   HGB 13.6 02/21/2020   HCT 41.1 02/21/2020   MCV 86 02/21/2020   PLT 272 02/21/2020   Lab Results  Component Value Date   NA 139 02/21/2020   K 3.8 02/21/2020   CO2 27 02/21/2020   GLUCOSE 110 (H) 02/21/2020    BUN 19 02/21/2020   CREATININE 0.83 02/21/2020   BILITOT 0.4 02/21/2020   ALKPHOS 89 02/21/2020   AST 23 02/21/2020   ALT 17 02/21/2020   PROT 6.8 02/21/2020  ALBUMIN 4.5 02/21/2020   CALCIUM 9.5 02/21/2020   Lab Results  Component Value Date   CHOL 154 02/21/2020   Lab Results  Component Value Date   HDL 58 02/21/2020   Lab Results  Component Value Date   LDLCALC 73 02/21/2020   Lab Results  Component Value Date   TRIG 131 02/21/2020   Lab Results  Component Value Date   CHOLHDL 2.7 02/21/2020   Lab Results  Component Value Date   HGBA1C 6.0 (H) 02/21/2020       Assessment & Plan:  1. Sciatica of right side Pt to take prednisone x 5 days, then start on diclofenac. Hold celebrex while taking diclofenac.  - predniSONE (DELTASONE) 50 MG tablet; Take 1 tablet (50 mg total) by mouth daily with breakfast.  Dispense: 5 tablet; Refill: 0 - diclofenac (VOLTAREN) 50 MG EC tablet; Take 1 tablet (50 mg total) by mouth 2 (two) times daily.  Dispense: 60 tablet; Refill: 0  2. Acute right ankle pain  Meds as above.  May try ankle brace.   3. Plantar fasciitis Has heel lifts.  Meds as above.   Meds ordered this encounter  Medications   predniSONE (DELTASONE) 50 MG tablet    Sig: Take 1 tablet (50 mg total) by mouth daily with breakfast.    Dispense:  5 tablet    Refill:  0   diclofenac (VOLTAREN) 50 MG EC tablet    Sig: Take 1 tablet (50 mg total) by mouth 2 (two) times daily.    Dispense:  60 tablet    Refill:  0    Follow-up: Return if symptoms worsen or fail to improve (also may call orthopedics).  An After Visit Summary was printed and given to the patient.  Blane Ohara, MD [facility] [phone]

## 2020-08-14 ENCOUNTER — Other Ambulatory Visit: Payer: Self-pay | Admitting: Family Medicine

## 2020-08-14 DIAGNOSIS — E1159 Type 2 diabetes mellitus with other circulatory complications: Secondary | ICD-10-CM

## 2020-08-14 DIAGNOSIS — I152 Hypertension secondary to endocrine disorders: Secondary | ICD-10-CM

## 2020-08-21 DIAGNOSIS — I10 Essential (primary) hypertension: Secondary | ICD-10-CM | POA: Diagnosis not present

## 2020-08-21 DIAGNOSIS — E119 Type 2 diabetes mellitus without complications: Secondary | ICD-10-CM | POA: Diagnosis not present

## 2020-08-21 DIAGNOSIS — E782 Mixed hyperlipidemia: Secondary | ICD-10-CM | POA: Diagnosis not present

## 2020-09-04 DIAGNOSIS — I10 Essential (primary) hypertension: Secondary | ICD-10-CM | POA: Diagnosis not present

## 2020-09-04 DIAGNOSIS — E782 Mixed hyperlipidemia: Secondary | ICD-10-CM | POA: Diagnosis not present

## 2020-09-08 ENCOUNTER — Other Ambulatory Visit: Payer: Self-pay | Admitting: Family Medicine

## 2020-09-08 DIAGNOSIS — M5136 Other intervertebral disc degeneration, lumbar region: Secondary | ICD-10-CM

## 2020-09-11 DIAGNOSIS — M25562 Pain in left knee: Secondary | ICD-10-CM | POA: Diagnosis not present

## 2020-09-11 DIAGNOSIS — M79671 Pain in right foot: Secondary | ICD-10-CM | POA: Diagnosis not present

## 2020-09-11 DIAGNOSIS — M545 Low back pain, unspecified: Secondary | ICD-10-CM | POA: Diagnosis not present

## 2020-09-11 NOTE — Telephone Encounter (Signed)
Refill sent to pharmacy.   

## 2020-09-13 ENCOUNTER — Other Ambulatory Visit: Payer: Self-pay

## 2020-09-13 DIAGNOSIS — E782 Mixed hyperlipidemia: Secondary | ICD-10-CM

## 2020-09-14 MED ORDER — ROSUVASTATIN CALCIUM 5 MG PO TABS: 5.0000 mg | ORAL_TABLET | Freq: Every day | ORAL | 0 refills | Status: DC

## 2020-09-25 DIAGNOSIS — M25562 Pain in left knee: Secondary | ICD-10-CM | POA: Diagnosis not present

## 2020-09-28 DIAGNOSIS — S83242A Other tear of medial meniscus, current injury, left knee, initial encounter: Secondary | ICD-10-CM | POA: Diagnosis not present

## 2020-10-05 ENCOUNTER — Telehealth: Payer: Self-pay

## 2020-10-05 NOTE — Telephone Encounter (Signed)
Left message for patient to call our office to schedule surgical clearance appt. Surgery is scheduled for 10/20/2020.

## 2020-10-09 ENCOUNTER — Other Ambulatory Visit: Payer: Self-pay

## 2020-10-09 ENCOUNTER — Ambulatory Visit (INDEPENDENT_AMBULATORY_CARE_PROVIDER_SITE_OTHER): Payer: Medicare HMO | Admitting: Nurse Practitioner

## 2020-10-09 ENCOUNTER — Encounter: Payer: Self-pay | Admitting: Nurse Practitioner

## 2020-10-09 VITALS — BP 122/72 | HR 74 | Temp 97.8°F | Ht 66.0 in | Wt 217.0 lb

## 2020-10-09 DIAGNOSIS — E1165 Type 2 diabetes mellitus with hyperglycemia: Secondary | ICD-10-CM | POA: Diagnosis not present

## 2020-10-09 DIAGNOSIS — K219 Gastro-esophageal reflux disease without esophagitis: Secondary | ICD-10-CM | POA: Diagnosis not present

## 2020-10-09 DIAGNOSIS — M19071 Primary osteoarthritis, right ankle and foot: Secondary | ICD-10-CM | POA: Diagnosis not present

## 2020-10-09 DIAGNOSIS — Z01818 Encounter for other preprocedural examination: Secondary | ICD-10-CM

## 2020-10-09 DIAGNOSIS — M1712 Unilateral primary osteoarthritis, left knee: Secondary | ICD-10-CM | POA: Diagnosis not present

## 2020-10-09 DIAGNOSIS — M7731 Calcaneal spur, right foot: Secondary | ICD-10-CM | POA: Diagnosis not present

## 2020-10-09 DIAGNOSIS — M79671 Pain in right foot: Secondary | ICD-10-CM

## 2020-10-09 DIAGNOSIS — E782 Mixed hyperlipidemia: Secondary | ICD-10-CM | POA: Diagnosis not present

## 2020-10-09 DIAGNOSIS — I10 Essential (primary) hypertension: Secondary | ICD-10-CM

## 2020-10-09 LAB — CBC WITH DIFFERENTIAL/PLATELET
Basophils Absolute: 0.1 10*3/uL (ref 0.0–0.2)
Basos: 1 %
EOS (ABSOLUTE): 0.4 10*3/uL (ref 0.0–0.4)
Eos: 5 %
Hematocrit: 41.8 % (ref 34.0–46.6)
Hemoglobin: 13.4 g/dL (ref 11.1–15.9)
Immature Grans (Abs): 0 10*3/uL (ref 0.0–0.1)
Immature Granulocytes: 0 %
Lymphocytes Absolute: 2.3 10*3/uL (ref 0.7–3.1)
Lymphs: 29 %
MCH: 28.5 pg (ref 26.6–33.0)
MCHC: 32.1 g/dL (ref 31.5–35.7)
MCV: 89 fL (ref 79–97)
Monocytes Absolute: 0.6 10*3/uL (ref 0.1–0.9)
Monocytes: 8 %
Neutrophils Absolute: 4.5 10*3/uL (ref 1.4–7.0)
Neutrophils: 57 %
Platelets: 314 10*3/uL (ref 150–450)
RBC: 4.7 x10E6/uL (ref 3.77–5.28)
RDW: 13.4 % (ref 11.7–15.4)
WBC: 7.9 10*3/uL (ref 3.4–10.8)

## 2020-10-09 LAB — COMPREHENSIVE METABOLIC PANEL
ALT: 34 IU/L — ABNORMAL HIGH (ref 0–32)
AST: 24 IU/L (ref 0–40)
Albumin/Globulin Ratio: 2.3 — ABNORMAL HIGH (ref 1.2–2.2)
Albumin: 4.9 g/dL — ABNORMAL HIGH (ref 3.8–4.8)
Alkaline Phosphatase: 94 IU/L (ref 44–121)
BUN/Creatinine Ratio: 21 (ref 12–28)
BUN: 19 mg/dL (ref 8–27)
Bilirubin Total: 0.3 mg/dL (ref 0.0–1.2)
CO2: 24 mmol/L (ref 20–29)
Calcium: 9.6 mg/dL (ref 8.7–10.3)
Chloride: 98 mmol/L (ref 96–106)
Creatinine, Ser: 0.91 mg/dL (ref 0.57–1.00)
Globulin, Total: 2.1 g/dL (ref 1.5–4.5)
Glucose: 108 mg/dL — ABNORMAL HIGH (ref 65–99)
Potassium: 3.6 mmol/L (ref 3.5–5.2)
Sodium: 140 mmol/L (ref 134–144)
Total Protein: 7 g/dL (ref 6.0–8.5)
eGFR: 69 mL/min/{1.73_m2} (ref >59)

## 2020-10-09 NOTE — Progress Notes (Addendum)
Subjective:  Patient ID: Anna Greene, female    DOB: 07-01-1955  Age: 65 y.o. MRN: [id number]  Chief Complaint  Patient presents with   Surgical Clearence    Left Knee Arthroscopy    HPI  Anna Greene is a 65 year old Caucasian female that presents for preoperative surgical clearance for left knee arthroscopy with Dr Gean Birchwood scheduled tentatively for 10/20/20. She denies previous complications with anesthesia, post-op nausea/vomiting, or sleep apnea. She does admit to a history of GERD that is well-controlled with OTC omeprazole. She has a history of hypertension and hyperlipidemia that are well-controlled. She is followed by cardiology, Dr Braulio Conte Last visit was 08/28/20, EKG normal. She denies chest pain, dyspnea, or headaches. She has type 2 DM, controlled with Amaryl 1 mg; HgbA1C 6.4, denies any episodes of hypoglycemia. She does take Celebrex 200 mg daily for osteoarthritis. She was informed to stop Celebrex 3-days prior to surgery. She denies any recent URI, UTI, or any wounds/sores/rashes.  Anna Greene states she is experiencing chronic right foot pain and swelling. She requested evaluation of this in-office today. States she was seen by podiatrist recently. States she was diagnosed with right foot plantar fasciitis and recommended ordering shoe inserts that have not improving pain.States pain is constant, aggravated by weight-bearing, no alleviating factors.States she is icing right foot, taking NSAIDs, and performing stretching exercises per recommendation. Denies previous injury, trauma, or surgery to right foot.   Current Outpatient Medications on File Prior to Visit  Medication Sig Dispense Refill   atenolol-chlorthalidone (TENORETIC) 100-25 MG tablet Take 1 tablet by mouth daily.     celecoxib (CELEBREX) 200 MG capsule Take 1 capsule by mouth twice daily 180 capsule 0   clonazePAM (KLONOPIN) 0.5 MG tablet TAKE 1 TABLET BY MOUTH ONCE DAILY AS NEEDED 30 tablet 5   cyclobenzaprine (FLEXERIL) 10 MG  tablet SMARTSIG:0.5-1 Tablet(s) By Mouth 2-3 Times Daily PRN     diclofenac (VOLTAREN) 50 MG EC tablet Take 1 tablet (50 mg total) by mouth 2 (two) times daily. 60 tablet 0   gabapentin (NEURONTIN) 100 MG capsule Take 2 capsules (200 mg total) by mouth 2 (two) times daily. 360 capsule 3   glimepiride (AMARYL) 1 MG tablet Take 1 tablet by mouth once daily with breakfast 90 tablet 0   loratadine (CLARITIN) 10 MG tablet Take by mouth.     losartan (COZAAR) 50 MG tablet Take by mouth.     omeprazole (PRILOSEC) 20 MG capsule Take 20 mg by mouth daily.     predniSONE (DELTASONE) 50 MG tablet Take 1 tablet (50 mg total) by mouth daily with breakfast. 5 tablet 0   rosuvastatin (CRESTOR) 5 MG tablet Take 1 tablet (5 mg total) by mouth daily. 90 tablet 0   potassium chloride (KLOR-CON) 10 MEQ tablet Take 1 tablet (10 mEq total) by mouth once for 1 dose. 1 tablet 0   No current facility-administered medications on file prior to visit.   Past Medical History:  Diagnosis Date   Benign fasciculation-cramp syndrome    Depression    Diabetes mellitus without complication (HCC)    GERD (gastroesophageal reflux disease)    Hyperlipidemia    Hypertension    Nephrolithiasis    Osteoarthritis    Bilateral knees, degenerative disc disease.  Patient has had epidural steroid injections x2   Plantar fasciitis    Past Surgical History:  Procedure Laterality Date   BREAST REDUCTION SURGERY Bilateral 1996   CESAREAN SECTION  1976   CESAREAN SECTION  1979  KNEE ARTHROSCOPY W/ MENISCAL REPAIR Left 1995   Per patient muscle was repaired   KNEE ARTHROSCOPY W/ MENISCAL REPAIR Right 2003   PANNICULECTOMY     REDUCTION MAMMAPLASTY  1996   REPLACEMENT TOTAL KNEE Right 2019   TONSILLECTOMY  1960    Family History  Problem Relation Age of Onset   Stroke Mother    Heart disease Mother    Cancer Father    Diabetes Sister    Stroke Brother    Anxiety disorder Sister    Depression Sister    Diabetes Sister     Diabetes Sister    Arthritis Sister    Social History   Socioeconomic History   Marital status: Married    Spouse name: Not on file   Number of children: Not on file   Years of education: Not on file   Highest education level: Not on file  Occupational History   Not on file  Tobacco Use   Smoking status: Never   Smokeless tobacco: Never  Vaping Use   Vaping Use: Never used  Substance and Sexual Activity   Alcohol use: Never   Drug use: Never   Sexual activity: Not on file  Other Topics Concern   Not on file  Social History Narrative   Patient is married.  Has 2 adult children.   Social Determinants of Health   Financial Resource Strain: Not on file  Food Insecurity: Not on file  Transportation Needs: Not on file  Physical Activity: Not on file  Stress: Not on file  Social Connections: Not on file    Review of Systems  Constitutional:  Negative for appetite change, fatigue and fever.  HENT:  Negative for congestion, ear pain, sinus pressure and sore throat.   Eyes:  Negative for pain.  Respiratory:  Negative for cough, chest tightness, shortness of breath and wheezing.   Cardiovascular:  Negative for chest pain and palpitations.  Gastrointestinal:  Negative for abdominal pain, constipation, diarrhea, nausea and vomiting.  Endocrine: Negative.   Genitourinary:  Negative for dysuria and hematuria.  Musculoskeletal:  Positive for arthralgias (right foot and left knee), joint swelling (Left knee and right ankle) and myalgias. Negative for back pain.  Skin:  Negative for rash.  Allergic/Immunologic: Positive for environmental allergies.  Neurological:  Negative for dizziness, weakness and headaches.  Psychiatric/Behavioral:  Negative for dysphoric mood. The patient is not nervous/anxious.     Objective:  BP 122/72 (BP Location: Left Arm, Patient Position: Sitting)   Pulse 74   Temp 97.8 F (36.6 C) (Temporal)   Ht 5\' 6"  (1.676 m)   Wt 217 lb (98.4 kg)   SpO2  99%   BMI 35.02 kg/m   BP/Weight 10/09/2020 08/01/2020 05/01/2020  Systolic BP 122 110 122  Diastolic BP 72 56 72  Wt. (Lbs) 217 222 218  BMI 35.02 35.83 35.19    Physical Exam Vitals reviewed.  Constitutional:      Appearance: Normal appearance.  HENT:     Right Ear: Tympanic membrane normal.     Left Ear: Tympanic membrane normal.     Nose: Nose normal.     Mouth/Throat:     Mouth: Mucous membranes are moist.  Eyes:     Pupils: Pupils are equal, round, and reactive to light.  Cardiovascular:     Rate and Rhythm: Normal rate and regular rhythm.     Pulses: Normal pulses.          Dorsalis pedis pulses are  2+ on the right side and 2+ on the left side.       Posterior tibial pulses are 2+ on the right side and 2+ on the left side.     Heart sounds: Normal heart sounds.  Pulmonary:     Effort: Pulmonary effort is normal.     Breath sounds: Normal breath sounds.  Abdominal:     General: Bowel sounds are normal.     Palpations: Abdomen is soft.  Musculoskeletal:        General: Swelling (right ankle, left knee) and tenderness (right foot, left knee) present.     Cervical back: Normal range of motion.       Feet:  Feet:     Right foot:     Skin integrity: Skin integrity normal.     Toenail Condition: Right toenails are normal.     Left foot:     Skin integrity: Skin integrity normal.     Comments: Tenderness, mild swelling to lateral aspect of right midfoot cuboid area Skin:    General: Skin is warm and dry.     Capillary Refill: Capillary refill takes less than 2 seconds.  Neurological:     General: No focal deficit present.     Mental Status: She is alert and oriented to person, place, and time.  Psychiatric:        Mood and Affect: Mood normal.        Behavior: Behavior normal.    Lab Results  Component Value Date   WBC 6.2 02/21/2020   HGB 13.6 02/21/2020   HCT 41.1 02/21/2020   PLT 272 02/21/2020   GLUCOSE 110 (H) 02/21/2020   CHOL 154 02/21/2020   TRIG  131 02/21/2020   HDL 58 02/21/2020   LDLCALC 73 02/21/2020   ALT 17 02/21/2020   AST 23 02/21/2020   NA 139 02/21/2020   K 3.8 02/21/2020   CL 96 02/21/2020   CREATININE 0.83 02/21/2020   BUN 19 02/21/2020   CO2 27 02/21/2020   TSH 0.948 02/21/2020   HGBA1C 6.0 (H) 02/21/2020      Assessment & Plan:    1. Pre-operative clearance - CBC with Differential/Platelet - Comprehensive metabolic panel  2. Essential hypertension-well controlled - CBC with Differential/Platelet - Comprehensive metabolic panel  3. Mixed hyperlipidemia-well controlled - Lipid Panel -continue Crestor 5 mg daily  4. Gastroesophageal reflux disease without esophagitis-well controlled - CBC with Differential/Platelet -Continue Omeprazole 20 mg daily  5. Primary osteoarthritis of left knee -stop Celebrex on 10/17/20 -follow up with Dr Turner Daniels as scheduled  6. Right foot pain - DG Foot Complete Right  7. Type 2 diabetes mellitus with hyperglycemia, without long-term current use of insulin (HCC)-well controlled - Hemoglobin A1c - Lipid Panel   -Continue Amaryl 1 mg daily    Stop Celebrex 3-days prior to surgery (10/17/20) Obtain right foot x-ray today We will obtain records from cardiology  Follow-up: Oct 31st, 2022  An After Visit Summary was printed and given to the patient.   I,Lauren M Auman,acting as a Neurosurgeon for [employer], NP.,have documented all relevant documentation on the behalf of Janie Morning, NP,as directed by  Janie Morning, NP while in the presence of Janie Morning, NP.   I, Janie Morning, NP, have reviewed all documentation for this visit. The documentation on 10/09/20 for the exam, diagnosis, procedures, and orders are all accurate and complete.    Janie Morning, NP [facility] [phone]  [phone]  

## 2020-10-09 NOTE — Patient Instructions (Addendum)
Stop Celebrex 3-days prior to surgery (10/17/20) Obtain right foot x-ray today We will obtain records from cardiology  Safety Before and After Surgery Your health care providers, including your surgical team, take many precautions to keep you safe during surgery. The following information explains the steps that your health care providers take to prevent surgical error and to reduce the risk of complications. It also lists some things that you, your friends, and your family members can do to ensure a safe surgery. What is surgical error? Surgical error is when a mistake is made during a procedure. Types of surgical error include: Operating on the wrong person. Operating on the wrong body part. Performing the wrong operation. Making a medicine mistake. Making a surgical mistake. What is a complication? A complication is a change in the normal surgical plan of care. Complications are known risks that, while rare, can occur during or after surgery. Examples of complications include: Infection. Excess bleeding. Inhaling food or liquids from your stomach into your lungs (aspiration). Allergic reaction to medicines. Review all risks and complications with your health care provider as part of the informed consent procedure before your surgery. Ask questions if you do not understand something. What steps are taken to prevent surgical error and complications? Your health care providers, including your surgical team, take many steps before and during a procedure to reduce the risk of error and complication. These steps include: Placing an identification bracelet on your wrist and checking it often. Checking the surgical consent form to make sure it is correct. Reviewing the surgical consent form with you to make sure that you understand the procedure. Reviewing your medicines and history of allergies to prevent drug reactions and interactions. Marking the surgery site before the procedure. Washing  and disinfecting the surgery site before the procedure to prevent infection. Giving you a list of safety steps to take before surgery. This usually includes what foods or medicines to avoid and any lifestyle changes you should make before the procedure, such as quitting smoking. How can I help to promote a safe surgery? One way you can help to prevent surgical error is by choosing a surgeon and facility that is right for you. To do this: Ask how many times your surgeon has performed the surgery you will be having. Ask how often the surgery is done in the hospital where you will have the procedure. Choose a hospital that is licensed, accredited, and has a system in place in case of an emergency. Here are some other steps you can take: Talk to your anesthesia provider about your medicines, allergies, any drug use, and any previous drug or anesthesia reactions you have had. Be honest. Review the surgical consent form. Make sure you understand it completely. Make sure that the correct area of your body is marked as the surgery site. Your surgeon will mark the surgery site before the surgery. Do not eat any food or drink before surgery if your surgeon has instructed you not to. Follow all instructions about eating and drinking. Keep the surgical area clean. Follow instructions if you are asked to shower with an antibacterial soap. Follow all of your home care instructions. Wash your hands often with soap and water for at least 20 seconds, and especially before bandage (dressing) changes. How can my friends or family members promote a safe surgery? Friends and family can help by: Going with you to your medical appointments before your surgery to make sure you understand the procedure. Reviewing the safety  steps that you need to take before surgery and making sure that you follow them. Knowing what surgery you are scheduled to have. Going with you to the procedure. Double-checking that the correct body  part is marked before surgery. Advocating for you on your behalf if you cannot do so. Following the home care instructions for after the surgery to help you heal well. Questions to ask before surgery Why do I need surgery? Is another treatment available? What are the risks and benefits of the surgery? Do I have a choice of anesthesia? What should I expect after surgery? Can I eat and drink after surgery? How much pain will I have? When can I go back to work? What is your experience with this type of surgery? Is the surgery center accredited? Do you take my insurance? How much will the surgery cost? Where to find more information [employer], "Preparing for Your Operation and Recovery": www.facs.org Centers for Disease Control and Prevention, "What You Should Know Before Your Surgery": [employer] Summary Your surgical team takes specific precautions to prevent surgical errors and complications. You can take steps to promote a safe surgery. Ask questions before your procedure, and make sure that you understand what will happen. Make sure that you understand your instructions for home care. This information is not intended to replace advice given to you by your health care provider. Make sure you discuss any questions you have with your health care provider. Document Revised: 03/13/2020 Document Reviewed: 03/13/2020 Elsevier Patient Education  2022 [employer]. Foot Pain Many things can cause foot pain. Some common causes are: An injury. A sprain. Arthritis. Blisters. Bunions. Follow these instructions at home: Managing pain, stiffness, and swelling If directed, put ice on the painful area: Put ice in a plastic bag. Place a towel between your skin and the bag. Leave the ice on for 20 minutes, 2-3 times a day.  Activity Do not stand or walk for long periods. Return to your normal activities as told by your health care provider. Ask your health care provider what  activities are safe for you. Do stretches to relieve foot pain and stiffness as told by your health care provider. Do not lift anything that is heavier than 10 lb (4.5 kg), or the limit that you are told, until your health care provider says that it is safe. Lifting a lot of weight can put added pressure on your feet. Lifestyle Wear comfortable, supportive shoes that fit you well. Do not wear high heels. Keep your feet clean and dry. General instructions Take over-the-counter and prescription medicines only as told by your health care provider. Rub your foot gently. Pay attention to any changes in your symptoms. Keep all follow-up visits as told by your health care provider. This is important. Contact a health care provider if: Your pain does not get better after a few days of self-care. Your pain gets worse. You cannot stand on your foot. Get help right away if: Your foot is numb or tingling. Your foot or toes are swollen. Your foot or toes turn white or blue. You have warmth and redness along your foot. Summary Common causes of foot pain are injury, sprain, arthritis, blisters, or bunions. Ice, medicines, and comfortable shoes may help foot pain. Contact your health care provider if your pain does not get better after a few days of self-care. This information is not intended to replace advice given to you by your health care provider. Make sure you discuss any  questions you have with your health care provider. Document Revised: 04/05/2020 Document Reviewed: 04/05/2020 Elsevier Patient Education  2022 [employer].

## 2020-10-11 LAB — LIPID PANEL W/O CHOL/HDL RATIO
Cholesterol, Total: 131 mg/dL (ref 100–199)
HDL: 48 mg/dL (ref >39)
LDL Chol Calc (NIH): 55 mg/dL (ref 0–99)
Triglycerides: 169 mg/dL — ABNORMAL HIGH (ref 0–149)
VLDL Cholesterol Cal: 28 mg/dL (ref 5–40)

## 2020-10-11 LAB — CARDIOVASCULAR RISK ASSESSMENT

## 2020-10-11 LAB — HGB A1C W/O EAG: Hgb A1c MFr Bld: 6.4 % — ABNORMAL HIGH (ref 4.8–5.6)

## 2020-10-11 LAB — SPECIMEN STATUS REPORT

## 2020-10-13 ENCOUNTER — Other Ambulatory Visit: Payer: Self-pay | Admitting: Nurse Practitioner

## 2020-10-14 HISTORY — PX: OTHER SURGICAL HISTORY: SHX169

## 2020-10-20 DIAGNOSIS — X58XXXA Exposure to other specified factors, initial encounter: Secondary | ICD-10-CM | POA: Diagnosis not present

## 2020-10-20 DIAGNOSIS — S83242A Other tear of medial meniscus, current injury, left knee, initial encounter: Secondary | ICD-10-CM | POA: Diagnosis not present

## 2020-10-20 DIAGNOSIS — G8918 Other acute postprocedural pain: Secondary | ICD-10-CM | POA: Diagnosis not present

## 2020-10-20 DIAGNOSIS — M23304 Other meniscus derangements, unspecified medial meniscus, left knee: Secondary | ICD-10-CM | POA: Diagnosis not present

## 2020-10-20 DIAGNOSIS — Y999 Unspecified external cause status: Secondary | ICD-10-CM | POA: Diagnosis not present

## 2020-10-20 DIAGNOSIS — S83282A Other tear of lateral meniscus, current injury, left knee, initial encounter: Secondary | ICD-10-CM | POA: Diagnosis not present

## 2020-10-20 DIAGNOSIS — M23301 Other meniscus derangements, unspecified lateral meniscus, left knee: Secondary | ICD-10-CM | POA: Diagnosis not present

## 2020-10-20 DIAGNOSIS — M94262 Chondromalacia, left knee: Secondary | ICD-10-CM | POA: Diagnosis not present

## 2020-10-31 DIAGNOSIS — M23304 Other meniscus derangements, unspecified medial meniscus, left knee: Secondary | ICD-10-CM | POA: Diagnosis not present

## 2020-10-31 DIAGNOSIS — M23301 Other meniscus derangements, unspecified lateral meniscus, left knee: Secondary | ICD-10-CM | POA: Diagnosis not present

## 2020-11-06 ENCOUNTER — Other Ambulatory Visit: Payer: Medicare HMO

## 2020-11-13 ENCOUNTER — Ambulatory Visit (INDEPENDENT_AMBULATORY_CARE_PROVIDER_SITE_OTHER): Payer: Medicare HMO | Admitting: Family Medicine

## 2020-11-13 ENCOUNTER — Other Ambulatory Visit: Payer: Self-pay

## 2020-11-13 ENCOUNTER — Encounter: Payer: Self-pay | Admitting: Family Medicine

## 2020-11-13 VITALS — BP 104/62 | HR 74 | Temp 97.2°F | Ht 66.0 in | Wt 216.0 lb

## 2020-11-13 DIAGNOSIS — E1169 Type 2 diabetes mellitus with other specified complication: Secondary | ICD-10-CM | POA: Diagnosis not present

## 2020-11-13 DIAGNOSIS — Z6834 Body mass index (BMI) 34.0-34.9, adult: Secondary | ICD-10-CM

## 2020-11-13 DIAGNOSIS — K219 Gastro-esophageal reflux disease without esophagitis: Secondary | ICD-10-CM | POA: Diagnosis not present

## 2020-11-13 DIAGNOSIS — E6609 Other obesity due to excess calories: Secondary | ICD-10-CM

## 2020-11-13 DIAGNOSIS — M722 Plantar fascial fibromatosis: Secondary | ICD-10-CM

## 2020-11-13 DIAGNOSIS — I152 Hypertension secondary to endocrine disorders: Secondary | ICD-10-CM | POA: Diagnosis not present

## 2020-11-13 DIAGNOSIS — E1165 Type 2 diabetes mellitus with hyperglycemia: Secondary | ICD-10-CM

## 2020-11-13 DIAGNOSIS — E1159 Type 2 diabetes mellitus with other circulatory complications: Secondary | ICD-10-CM

## 2020-11-13 DIAGNOSIS — I10 Essential (primary) hypertension: Secondary | ICD-10-CM

## 2020-11-13 DIAGNOSIS — E785 Hyperlipidemia, unspecified: Secondary | ICD-10-CM

## 2020-11-13 DIAGNOSIS — E782 Mixed hyperlipidemia: Secondary | ICD-10-CM | POA: Diagnosis not present

## 2020-11-13 MED ORDER — OZEMPIC (0.25 OR 0.5 MG/DOSE) 2 MG/1.5ML ~~LOC~~ SOPN: PEN_INJECTOR | SUBCUTANEOUS | 2 refills | Status: DC

## 2020-11-13 NOTE — Patient Instructions (Signed)
Diabetes/weight loss: Start on ozempic 0.25 mg once weekly x 4 weeks, then increase to ozempic 0.50 mg once weekly.  Stop glimeperide.  Recommend continue to work on eating healthy diet and exercise.

## 2020-11-13 NOTE — Progress Notes (Signed)
Subjective:  Patient ID: Anna Greene, female    DOB: September 20, 1955  Age: 65 y.o. MRN: [id number]  Chief Complaint  Patient presents with   Hypertension   Hyperlipidemia    HPI Diabetes:  Complications: HTN Most recent A1C: 6.4 Current medications: Amaryl 1 mg daily Last Eye Exam: 06/2020 Foot checks: Checks daily  Hyperlipidemia: Current medications: Crestor 5 mg daily  Hypertension: Current medications: Atenolol-Chlorthalidone 100-25 mg qd, Losartan 50 mg qd.  Plantar fasciitis: doing stretching/ice/numerous orthotics.   Diet: normal Exercise: moderate   Current Outpatient Medications on File Prior to Visit  Medication Sig Dispense Refill   cetirizine (ZYRTEC) 10 MG tablet Take 10 mg by mouth daily.     atenolol-chlorthalidone (TENORETIC) 100-25 MG tablet Take 1 tablet by mouth daily.     celecoxib (CELEBREX) 200 MG capsule Take 1 capsule by mouth twice daily 180 capsule 0   diclofenac (VOLTAREN) 50 MG EC tablet Take 1 tablet (50 mg total) by mouth 2 (two) times daily. 60 tablet 0   gabapentin (NEURONTIN) 100 MG capsule Take 2 capsules (200 mg total) by mouth 2 (two) times daily. 360 capsule 3   losartan (COZAAR) 50 MG tablet Take by mouth.     omeprazole (PRILOSEC) 20 MG capsule Take 20 mg by mouth daily.     potassium chloride (KLOR-CON) 10 MEQ tablet Take 1 tablet (10 mEq total) by mouth once for 1 dose. 1 tablet 0   rosuvastatin (CRESTOR) 5 MG tablet Take 1 tablet (5 mg total) by mouth daily. 90 tablet 0   No current facility-administered medications on file prior to visit.   Past Medical History:  Diagnosis Date   Benign fasciculation-cramp syndrome    Depression    Diabetes mellitus without complication (HCC)    GERD (gastroesophageal reflux disease)    Hyperlipidemia    Hypertension    Nephrolithiasis    Osteoarthritis    Bilateral knees, degenerative disc disease.  Patient has had epidural steroid injections x2   Plantar fasciitis    Past Surgical  History:  Procedure Laterality Date   BREAST REDUCTION SURGERY Bilateral 1996   CESAREAN SECTION  1976   CESAREAN SECTION  1979   KNEE ARTHROSCOPY W/ MENISCAL REPAIR Left 1995   Per patient muscle was repaired   KNEE ARTHROSCOPY W/ MENISCAL REPAIR Right 2003   knee surgery Left 10/2020   meniscal repair via arthroscopy   PANNICULECTOMY     REDUCTION MAMMAPLASTY  1996   REPLACEMENT TOTAL KNEE Right 2019   TONSILLECTOMY  1960    Family History  Problem Relation Age of Onset   Stroke Mother    Heart disease Mother    Cancer Father    Diabetes Sister    Stroke Brother    Anxiety disorder Sister    Depression Sister    Diabetes Sister    Diabetes Sister    Arthritis Sister    Social History   Socioeconomic History   Marital status: Married    Spouse name: Not on file   Number of children: Not on file   Years of education: Not on file   Highest education level: Not on file  Occupational History   Not on file  Tobacco Use   Smoking status: Never   Smokeless tobacco: Never  Vaping Use   Vaping Use: Never used  Substance and Sexual Activity   Alcohol use: Never   Drug use: Never   Sexual activity: Not on file  Other Topics Concern  Not on file  Social History Narrative   Patient is married.  Has 2 adult children.   Social Determinants of Health   Financial Resource Strain: Not on file  Food Insecurity: Not on file  Transportation Needs: Not on file  Physical Activity: Not on file  Stress: Not on file  Social Connections: Not on file    Review of Systems  Constitutional:  Negative for chills, fatigue and fever.  HENT:  Negative for congestion, ear pain, rhinorrhea and sore throat.   Respiratory:  Negative for cough and shortness of breath.   Cardiovascular:  Negative for chest pain.  Gastrointestinal:  Negative for abdominal pain, constipation, diarrhea, nausea and vomiting.  Genitourinary:  Negative for dysuria and urgency.  Musculoskeletal:  Negative for  back pain and myalgias.  Neurological:  Negative for dizziness, weakness, light-headedness and headaches.  Psychiatric/Behavioral:  Negative for dysphoric mood. The patient is not nervous/anxious.     Objective:  BP 104/62   Pulse 74   Temp (!) 97.2 F (36.2 C)   Ht 5\' 6"  (1.676 m)   Wt 216 lb (98 kg)   SpO2 99%   BMI 34.86 kg/m   BP/Weight 11/13/2020 10/09/2020 08/01/2020  Systolic BP 104 122 110  Diastolic BP 62 72 56  Wt. (Lbs) 216 217 222  BMI 34.86 35.02 35.83    Physical Exam Vitals reviewed.  Constitutional:      Appearance: Normal appearance. She is obese.  Neck:     Vascular: No carotid bruit.  Cardiovascular:     Rate and Rhythm: Normal rate and regular rhythm.     Pulses: Normal pulses.     Heart sounds: Normal heart sounds.  Pulmonary:     Effort: Pulmonary effort is normal. No respiratory distress.     Breath sounds: Normal breath sounds.  Abdominal:     General: Abdomen is flat. Bowel sounds are normal.     Palpations: Abdomen is soft.     Tenderness: There is no abdominal tenderness.  Neurological:     Mental Status: She is alert and oriented to person, place, and time.  Psychiatric:        Mood and Affect: Mood normal.        Behavior: Behavior normal.    Diabetic Foot Exam - Simple   Simple Foot Form Diabetic Foot exam was performed with the following findings: Yes 11/14/2020  9:36 PM  Visual Inspection No deformities, no ulcerations, no other skin breakdown bilaterally: Yes Sensation Testing Intact to touch and monofilament testing bilaterally: Yes Pulse Check Posterior Tibialis and Dorsalis pulse intact bilaterally: Yes Comments Tender over plantar fascia rt foot.       Lab Results  Component Value Date   WBC 7.9 10/09/2020   HGB 13.4 10/09/2020   HCT 41.8 10/09/2020   PLT 314 10/09/2020   GLUCOSE 108 (H) 10/09/2020   CHOL 131 10/09/2020   TRIG 169 (H) 10/09/2020   HDL 48 10/09/2020   LDLCALC 55 10/09/2020   ALT 34 (H) 10/09/2020    AST 24 10/09/2020   NA 140 10/09/2020   K 3.6 10/09/2020   CL 98 10/09/2020   CREATININE 0.91 10/09/2020   BUN 19 10/09/2020   CO2 24 10/09/2020   TSH 0.948 02/21/2020   HGBA1C 6.4 (H) 10/09/2020      Assessment & Plan:   Problem List Items Addressed This Visit       Cardiovascular and Mediastinum   Hypertension associated with diabetes (HCC) - Primary  The current medical regimen is effective;  continue present plan and medications. Continue Atenolol-Chlorthalidone 100-25 mg qd, Losartan 50 mg qd.      Relevant Medications   Semaglutide,0.25 or 0.5MG /DOS, (OZEMPIC, 0.25 OR 0.5 MG/DOSE,) 2 MG/1.5ML SOPN     Digestive   Gastroesophageal reflux disease without esophagitis    Continue omeprazole 20 mg once daily.        Endocrine   Combined hyperlipidemia associated with type 2 diabetes mellitus (HCC)    Stop amaryl.  Start ozempic 0.25 mg once weekly x 4 weeks, then increase 0.5 mg weekly. Recommend diabetic diet.  Recommend exercise if able.       Relevant Medications   Semaglutide,0.25 or 0.5MG /DOS, (OZEMPIC, 0.25 OR 0.5 MG/DOSE,) 2 MG/1.5ML SOPN     Musculoskeletal and Integument   Plantar fasciitis    May return for injection if wishes. Continue conservative treatment         Other   Hyperlipidemia    The current medical regimen is effective;  continue present plan and medications. Continue crestor 5 mg once daily.  Low fat diet.       Class 1 obesity due to excess calories with serious comorbidity and body mass index (BMI) of 34.0 to 34.9 in adult    Recommend healthy diet.  Start ozempic.      Relevant Medications   Semaglutide,0.25 or 0.5MG /DOS, (OZEMPIC, 0.25 OR 0.5 MG/DOSE,) 2 MG/1.5ML SOPN  .  Meds ordered this encounter  Medications   Semaglutide,0.25 or 0.5MG /DOS, (OZEMPIC, 0.25 OR 0.5 MG/DOSE,) 2 MG/1.5ML SOPN    Sig: Inject 0.25 mg into the skin once a week for 30 days, THEN 0.5 mg once a week.    Dispense:  1.5 mL    Refill:  2    Total time spent on today's visit was greater than 30 minutes, including both face-to-face time and nonface-to-face time personally spent on review of chart (labs and imaging), discussing labs and goals, discussing further work-up, treatment options, referrals to specialist if needed, reviewing outside records of pertinent, answering patient's questions, and coordinating care.   Follow-up: Return in about 6 weeks (around 12/25/2020) for awv.  An After Visit Summary was printed and given to the patient.  Blane Ohara, MD [facility] [phone]

## 2020-11-14 ENCOUNTER — Encounter: Payer: Self-pay | Admitting: Family Medicine

## 2020-11-14 ENCOUNTER — Other Ambulatory Visit: Payer: Self-pay | Admitting: Family Medicine

## 2020-11-14 DIAGNOSIS — E66811 Obesity, class 1: Secondary | ICD-10-CM | POA: Insufficient documentation

## 2020-11-14 DIAGNOSIS — M722 Plantar fascial fibromatosis: Secondary | ICD-10-CM | POA: Insufficient documentation

## 2020-11-14 DIAGNOSIS — K219 Gastro-esophageal reflux disease without esophagitis: Secondary | ICD-10-CM | POA: Insufficient documentation

## 2020-11-14 DIAGNOSIS — E6609 Other obesity due to excess calories: Secondary | ICD-10-CM | POA: Insufficient documentation

## 2020-11-14 DIAGNOSIS — F411 Generalized anxiety disorder: Secondary | ICD-10-CM

## 2020-11-14 NOTE — Assessment & Plan Note (Signed)
Recommend healthy diet.  Start ozempic.

## 2020-11-14 NOTE — Assessment & Plan Note (Signed)
The current medical regimen is effective;  continue present plan and medications. Continue Atenolol-Chlorthalidone 100-25 mg qd, Losartan 50 mg qd.

## 2020-11-14 NOTE — Progress Notes (Signed)
Subjective:  Patient ID: Anna Greene, female    DOB: Apr 21, 1955  Age: 65 y.o. MRN: [id number]  Chief Complaint  Patient presents with   Hypertension   Hyperlipidemia    HPI Diabetes:  Complications: HTN Most recent A1C: 6.4 Current medications: Amaryl 1 mg daily Last Eye Exam: 06/2020 Foot checks: Checks daily  Hyperlipidemia: Current medications: Crestor 5 mg daily  Hypertension: Current medications: Atenolol-Chlorthalidone 100-25 mg qd, Losartan 50 mg qd.  Plantar fasciitis: doing stretching/ice/numerous orthotics. Frustrated as they continue to hurt. She has had injections in the past.   Diet: normal Exercise: moderate   Current Outpatient Medications on File Prior to Visit  Medication Sig Dispense Refill   cetirizine (ZYRTEC) 10 MG tablet Take 10 mg by mouth daily.     atenolol-chlorthalidone (TENORETIC) 100-25 MG tablet Take 1 tablet by mouth daily.     celecoxib (CELEBREX) 200 MG capsule Take 1 capsule by mouth twice daily 180 capsule 0   diclofenac (VOLTAREN) 50 MG EC tablet Take 1 tablet (50 mg total) by mouth 2 (two) times daily. 60 tablet 0   gabapentin (NEURONTIN) 100 MG capsule Take 2 capsules (200 mg total) by mouth 2 (two) times daily. 360 capsule 3   losartan (COZAAR) 50 MG tablet Take by mouth.     omeprazole (PRILOSEC) 20 MG capsule Take 20 mg by mouth daily.     potassium chloride (KLOR-CON) 10 MEQ tablet Take 1 tablet (10 mEq total) by mouth once for 1 dose. 1 tablet 0   rosuvastatin (CRESTOR) 5 MG tablet Take 1 tablet (5 mg total) by mouth daily. 90 tablet 0   No current facility-administered medications on file prior to visit.   Past Medical History:  Diagnosis Date   Benign fasciculation-cramp syndrome    Depression    Diabetes mellitus without complication (HCC)    GERD (gastroesophageal reflux disease)    Hyperlipidemia    Hypertension    Nephrolithiasis    Osteoarthritis    Bilateral knees, degenerative disc disease.  Patient has had  epidural steroid injections x2   Plantar fasciitis    Past Surgical History:  Procedure Laterality Date   BREAST REDUCTION SURGERY Bilateral 1996   CESAREAN SECTION  1976   CESAREAN SECTION  1979   KNEE ARTHROSCOPY W/ MENISCAL REPAIR Left 1995   Per patient muscle was repaired   KNEE ARTHROSCOPY W/ MENISCAL REPAIR Right 2003   knee surgery Left 10/2020   meniscal repair via arthroscopy   PANNICULECTOMY     REDUCTION MAMMAPLASTY  1996   REPLACEMENT TOTAL KNEE Right 2019   TONSILLECTOMY  1960    Family History  Problem Relation Age of Onset   Stroke Mother    Heart disease Mother    Cancer Father    Diabetes Sister    Stroke Brother    Anxiety disorder Sister    Depression Sister    Diabetes Sister    Diabetes Sister    Arthritis Sister    Social History   Socioeconomic History   Marital status: Married    Spouse name: Not on file   Number of children: Not on file   Years of education: Not on file   Highest education level: Not on file  Occupational History   Not on file  Tobacco Use   Smoking status: Never   Smokeless tobacco: Never  Vaping Use   Vaping Use: Never used  Substance and Sexual Activity   Alcohol use: Never   Drug  use: Never   Sexual activity: Not on file  Other Topics Concern   Not on file  Social History Narrative   Patient is married.  Has 2 adult children.   Social Determinants of Health   Financial Resource Strain: Not on file  Food Insecurity: Not on file  Transportation Needs: Not on file  Physical Activity: Not on file  Stress: Not on file  Social Connections: Not on file    Review of Systems  Constitutional:  Negative for chills, fatigue and fever.  HENT:  Negative for congestion, ear pain, rhinorrhea and sore throat.   Respiratory:  Negative for cough and shortness of breath.   Cardiovascular:  Negative for chest pain.  Gastrointestinal:  Negative for abdominal pain, constipation, diarrhea, nausea and vomiting.   Genitourinary:  Negative for dysuria and urgency.  Musculoskeletal:  Positive for arthralgias, back pain and myalgias.  Neurological:  Negative for dizziness, weakness, light-headedness and headaches.  Psychiatric/Behavioral:  Negative for dysphoric mood. The patient is not nervous/anxious.     Objective:  BP 104/62   Pulse 74   Temp (!) 97.2 F (36.2 C)   Ht 5\' 6"  (1.676 m)   Wt 216 lb (98 kg)   SpO2 99%   BMI 34.86 kg/m   BP/Weight 11/13/2020 10/09/2020 08/01/2020  Systolic BP 104 122 110  Diastolic BP 62 72 56  Wt. (Lbs) 216 217 222  BMI 34.86 35.02 35.83    Physical Exam  Diabetic Foot Exam - Simple   Simple Foot Form Diabetic Foot exam was performed with the following findings: Yes 11/14/2020  9:36 PM  Visual Inspection No deformities, no ulcerations, no other skin breakdown bilaterally: Yes Sensation Testing Intact to touch and monofilament testing bilaterally: Yes Pulse Check Posterior Tibialis and Dorsalis pulse intact bilaterally: Yes Comments Tender over plantar fascia rt foot.       Lab Results  Component Value Date   WBC 7.9 10/09/2020   HGB 13.4 10/09/2020   HCT 41.8 10/09/2020   PLT 314 10/09/2020   GLUCOSE 108 (H) 10/09/2020   CHOL 131 10/09/2020   TRIG 169 (H) 10/09/2020   HDL 48 10/09/2020   LDLCALC 55 10/09/2020   ALT 34 (H) 10/09/2020   AST 24 10/09/2020   NA 140 10/09/2020   K 3.6 10/09/2020   CL 98 10/09/2020   CREATININE 0.91 10/09/2020   BUN 19 10/09/2020   CO2 24 10/09/2020   TSH 0.948 02/21/2020   HGBA1C 6.4 (H) 10/09/2020      Assessment & Plan:   Problem List Items Addressed This Visit       Cardiovascular and Mediastinum   Hypertension associated with diabetes (HCC) - Primary    The current medical regimen is effective;  continue present plan and medications. Continue Atenolol-Chlorthalidone 100-25 mg qd, Losartan 50 mg qd.      Relevant Medications   Semaglutide,0.25 or 0.5MG /DOS, (OZEMPIC, 0.25 OR 0.5 MG/DOSE,)  2 MG/1.5ML SOPN     Digestive   Gastroesophageal reflux disease without esophagitis    Continue omeprazole 20 mg once daily.        Endocrine   Combined hyperlipidemia associated with type 2 diabetes mellitus (HCC)    Stop amaryl.  Start ozempic 0.25 mg once weekly x 4 weeks, then increase 0.5 mg weekly. Recommend diabetic diet.  Recommend exercise if able.       Relevant Medications   Semaglutide,0.25 or 0.5MG /DOS, (OZEMPIC, 0.25 OR 0.5 MG/DOSE,) 2 MG/1.5ML SOPN     Musculoskeletal  and Integument   Plantar fasciitis    May return for injection if wishes. Continue conservative treatment         Other   Hyperlipidemia    The current medical regimen is effective;  continue present plan and medications. Continue crestor 5 mg once daily.  Low fat diet.       Class 1 obesity due to excess calories with serious comorbidity and body mass index (BMI) of 34.0 to 34.9 in adult    Recommend healthy diet.  Start ozempic.      Relevant Medications   Semaglutide,0.25 or 0.5MG /DOS, (OZEMPIC, 0.25 OR 0.5 MG/DOSE,) 2 MG/1.5ML SOPN  .  Meds ordered this encounter  Medications   Semaglutide,0.25 or 0.5MG /DOS, (OZEMPIC, 0.25 OR 0.5 MG/DOSE,) 2 MG/1.5ML SOPN    Sig: Inject 0.25 mg into the skin once a week for 30 days, THEN 0.5 mg once a week.    Dispense:  1.5 mL    Refill:  2   Total time spent on today's visit was greater than 30 minutes, including both face-to-face time and nonface-to-face time personally spent on review of chart (labs and imaging), discussing labs and goals, discussing further work-up, treatment options, referrals to specialist if needed, reviewing outside records of pertinent, answering patient's questions, and coordinating care.   No orders of the defined types were placed in this encounter.    Follow-up: Return in about 6 weeks (around 12/25/2020) for awv.  An After Visit Summary was printed and given to the patient.  Blane Ohara, MD [facility] [phone]

## 2020-11-14 NOTE — Assessment & Plan Note (Signed)
The current medical regimen is effective;  continue present plan and medications. Continue crestor 5 mg once daily.  Low fat diet.

## 2020-11-14 NOTE — Assessment & Plan Note (Signed)
Continue omeprazole 20 mg once daily. 

## 2020-11-14 NOTE — Assessment & Plan Note (Signed)
Stop amaryl.  Start ozempic 0.25 mg once weekly x 4 weeks, then increase 0.5 mg weekly. Recommend diabetic diet.  Recommend exercise if able.

## 2020-11-14 NOTE — Assessment & Plan Note (Addendum)
May return for injection if wishes. Continue conservative treatment

## 2020-12-08 DIAGNOSIS — R300 Dysuria: Secondary | ICD-10-CM | POA: Diagnosis not present

## 2020-12-17 ENCOUNTER — Other Ambulatory Visit: Payer: Self-pay | Admitting: Family Medicine

## 2020-12-17 DIAGNOSIS — M5136 Other intervertebral disc degeneration, lumbar region: Secondary | ICD-10-CM

## 2020-12-25 ENCOUNTER — Ambulatory Visit: Payer: Medicare HMO | Admitting: Family Medicine

## 2020-12-25 ENCOUNTER — Other Ambulatory Visit: Payer: Self-pay

## 2020-12-25 DIAGNOSIS — M5136 Other intervertebral disc degeneration, lumbar region: Secondary | ICD-10-CM

## 2020-12-25 MED ORDER — CELECOXIB 200 MG PO CAPS
200.0000 mg | ORAL_CAPSULE | Freq: Two times a day (BID) | ORAL | 0 refills | Status: DC
Start: 2020-12-25 — End: 2021-01-12

## 2020-12-26 ENCOUNTER — Other Ambulatory Visit: Payer: Self-pay

## 2020-12-26 ENCOUNTER — Ambulatory Visit (INDEPENDENT_AMBULATORY_CARE_PROVIDER_SITE_OTHER): Payer: Medicare HMO | Admitting: Family Medicine

## 2020-12-26 DIAGNOSIS — Z6834 Body mass index (BMI) 34.0-34.9, adult: Secondary | ICD-10-CM | POA: Diagnosis not present

## 2020-12-26 DIAGNOSIS — E782 Mixed hyperlipidemia: Secondary | ICD-10-CM

## 2020-12-26 DIAGNOSIS — E1169 Type 2 diabetes mellitus with other specified complication: Secondary | ICD-10-CM | POA: Diagnosis not present

## 2020-12-26 DIAGNOSIS — M722 Plantar fascial fibromatosis: Secondary | ICD-10-CM | POA: Diagnosis not present

## 2020-12-26 DIAGNOSIS — E6609 Other obesity due to excess calories: Secondary | ICD-10-CM | POA: Diagnosis not present

## 2020-12-26 MED ORDER — OZEMPIC (1 MG/DOSE) 4 MG/3ML ~~LOC~~ SOPN: 1.0000 mg | PEN_INJECTOR | SUBCUTANEOUS | 1 refills | Status: DC

## 2020-12-26 NOTE — Progress Notes (Signed)
Subjective:  Patient ID: Anna Greene, female    DOB: 02/09/1955  Age: 65 y.o. MRN: [id number]  Chief Complaint  Patient presents with   Diabetes   Foot Pain    Right     HPI Diabetes: On ozempic 0.5 mg weekly. Pt stopped amaryl. Appetite is suppressed. No weight loss yet. Eating healthy. Moderate exercise.   Rt foot pain: plantar fasciitis. Requestng injection.  Current Outpatient Medications on File Prior to Visit  Medication Sig Dispense Refill   atenolol-chlorthalidone (TENORETIC) 100-25 MG tablet Take 1 tablet by mouth daily.     celecoxib (CELEBREX) 200 MG capsule Take 1 capsule (200 mg total) by mouth 2 (two) times daily. 180 capsule 0   cetirizine (ZYRTEC) 10 MG tablet Take 10 mg by mouth daily.     clonazePAM (KLONOPIN) 0.5 MG tablet TAKE 1 TABLET BY MOUTH ONCE DAILY AS NEEDED 30 tablet 2   diclofenac (VOLTAREN) 50 MG EC tablet Take 1 tablet (50 mg total) by mouth 2 (two) times daily. 60 tablet 0   gabapentin (NEURONTIN) 100 MG capsule Take 2 capsules (200 mg total) by mouth 2 (two) times daily. 360 capsule 3   losartan (COZAAR) 50 MG tablet Take by mouth.     omeprazole (PRILOSEC) 20 MG capsule Take 20 mg by mouth daily.     potassium chloride (KLOR-CON) 10 MEQ tablet Take 1 tablet (10 mEq total) by mouth once for 1 dose. 1 tablet 0   rosuvastatin (CRESTOR) 5 MG tablet Take 1 tablet (5 mg total) by mouth daily. 90 tablet 0   No current facility-administered medications on file prior to visit.   Past Medical History:  Diagnosis Date   Benign fasciculation-cramp syndrome    Depression    Diabetes mellitus without complication (HCC)    GERD (gastroesophageal reflux disease)    Hyperlipidemia    Hypertension    Nephrolithiasis    Osteoarthritis    Bilateral knees, degenerative disc disease.  Patient has had epidural steroid injections x2   Plantar fasciitis    Past Surgical History:  Procedure Laterality Date   BREAST REDUCTION SURGERY Bilateral 1996   CESAREAN  SECTION  1976   CESAREAN SECTION  1979   KNEE ARTHROSCOPY W/ MENISCAL REPAIR Left 1995   Per patient muscle was repaired   KNEE ARTHROSCOPY W/ MENISCAL REPAIR Right 2003   knee surgery Left 10/2020   meniscal repair via arthroscopy   PANNICULECTOMY     REDUCTION MAMMAPLASTY  1996   REPLACEMENT TOTAL KNEE Right 2019   TONSILLECTOMY  1960    Family History  Problem Relation Age of Onset   Stroke Mother    Heart disease Mother    Cancer Father    Diabetes Sister    Stroke Brother    Anxiety disorder Sister    Depression Sister    Diabetes Sister    Diabetes Sister    Arthritis Sister    Social History   Socioeconomic History   Marital status: Married    Spouse name: Not on file   Number of children: Not on file   Years of education: Not on file   Highest education level: Not on file  Occupational History   Not on file  Tobacco Use   Smoking status: Never   Smokeless tobacco: Never  Vaping Use   Vaping Use: Never used  Substance and Sexual Activity   Alcohol use: Never   Drug use: Never   Sexual activity: Not on file  Other Topics Concern   Not on file  Social History Narrative   Patient is married.  Has 2 adult children.   Social Determinants of Health   Financial Resource Strain: Low Risk    Difficulty of Paying Living Expenses: Not hard at all  Food Insecurity: Not on file  Transportation Needs: No Transportation Needs   Lack of Transportation (Medical): No   Lack of Transportation (Non-Medical): No  Physical Activity: Not on file  Stress: Not on file  Social Connections: Not on file    Review of Systems  Constitutional:  Negative for chills, fatigue and fever.  HENT:  Negative for congestion, rhinorrhea and sore throat.   Respiratory:  Negative for cough and shortness of breath.   Cardiovascular:  Negative for chest pain.  Gastrointestinal:  Negative for abdominal pain, constipation, diarrhea, nausea and vomiting.  Genitourinary:  Negative for  dysuria and urgency.  Musculoskeletal:  Positive for arthralgias, back pain and myalgias. Negative for joint swelling.  Neurological:  Negative for dizziness, weakness, light-headedness and headaches.  Psychiatric/Behavioral:  Negative for dysphoric mood. The patient is not nervous/anxious.     Objective:  BP 110/70    Pulse 88    Temp 98.7 F (37.1 C)    Resp 16    Ht 5\' 6"  (1.676 m)    Wt 217 lb (98.4 kg)    BMI 35.02 kg/m   BP/Weight 12/26/2020 11/13/2020 10/09/2020  Systolic BP 110 104 122  Diastolic BP 70 62 72  Wt. (Lbs) 217 216 217  BMI 35.02 34.86 35.02    Physical Exam Vitals reviewed.  Constitutional:      Appearance: Normal appearance. She is normal weight.  Neck:     Vascular: No carotid bruit.  Cardiovascular:     Rate and Rhythm: Normal rate and regular rhythm.     Heart sounds: Normal heart sounds.  Pulmonary:     Effort: Pulmonary effort is normal. No respiratory distress.     Breath sounds: Normal breath sounds.  Abdominal:     General: Abdomen is flat. Bowel sounds are normal.     Palpations: Abdomen is soft.     Tenderness: There is no abdominal tenderness.  Musculoskeletal:        General: Tenderness (rt plantar fascia.) present.  Neurological:     Mental Status: She is alert and oriented to person, place, and time.  Psychiatric:        Mood and Affect: Mood normal.        Behavior: Behavior normal.    Diabetic Foot Exam - Simple   No data filed      Lab Results  Component Value Date   WBC 7.9 10/09/2020   HGB 13.4 10/09/2020   HCT 41.8 10/09/2020   PLT 314 10/09/2020   GLUCOSE 108 (H) 10/09/2020   CHOL 131 10/09/2020   TRIG 169 (H) 10/09/2020   HDL 48 10/09/2020   LDLCALC 55 10/09/2020   ALT 34 (H) 10/09/2020   AST 24 10/09/2020   NA 140 10/09/2020   K 3.6 10/09/2020   CL 98 10/09/2020   CREATININE 0.91 10/09/2020   BUN 19 10/09/2020   CO2 24 10/09/2020   TSH 0.948 02/21/2020   HGBA1C 6.4 (H) 10/09/2020      Assessment &  Plan:   Problem List Items Addressed This Visit       Endocrine   Combined hyperlipidemia associated with type 2 diabetes mellitus (HCC)    Increase ozempic 1 mg once  daily.       Relevant Medications   Semaglutide, 1 MG/DOSE, (OZEMPIC, 1 MG/DOSE,) 4 MG/3ML SOPN     Musculoskeletal and Integument   Plantar fasciitis    Risks were discussed including bleeding, infection, increase in sugars if diabetic, atrophy at site of injection, and increased pain.  After consent was obtained, using sterile technique the knee was prepped with alcohol. The plantar fascia of the right foot was injected form medial side with Kenalog 20 mg and 3 ml plain Lidocaine was then injected and the needle withdrawn.  The procedure was well tolerated.   The patient is asked to continue to rest the joint for a few more days before resuming regular activities.  It may be more painful for the first 1-2 days.  Watch for fever, or increased swelling or persistent pain in the joint. Call or return to clinic prn if such symptoms occur or there is failure to improve as anticipated.        Other   Class 1 obesity due to excess calories with serious comorbidity and body mass index (BMI) of 34.0 to 34.9 in adult    Recommend continue to work on eating healthy diet and exercise. Increase ozempic to 1 mg once weekly.       Relevant Medications   Semaglutide, 1 MG/DOSE, (OZEMPIC, 1 MG/DOSE,) 4 MG/3ML SOPN  .  Meds ordered this encounter  Medications   Semaglutide, 1 MG/DOSE, (OZEMPIC, 1 MG/DOSE,) 4 MG/3ML SOPN    Sig: Inject 1 mg into the skin once a week.    Dispense:  9 mL    Refill:  1    Follow-up: Return in about 6 weeks (around 02/06/2021) for chronic fasting.  An After Visit Summary was printed and given to the patient.  Blane Ohara, MD [facility] [phone]

## 2020-12-27 ENCOUNTER — Telehealth: Payer: Self-pay | Admitting: Family Medicine

## 2020-12-27 ENCOUNTER — Telehealth: Payer: Self-pay

## 2020-12-27 NOTE — Progress Notes (Signed)
°  Chronic Care Management   Note  12/27/2020 Name: Anna Greene MRN: [id number] DOB: Dec 15, 1955  [name] is a 65 y.o. year old female who is a primary care patient of Cox, Kirsten, MD. I reached out to [name] by phone today in response to a referral sent by [name] PCP, Cox, Kirsten, MD.   [name] was given information about Chronic Care Management services today including:  CCM service includes personalized support from designated clinical staff supervised by her physician, including individualized plan of care and coordination with other care providers 24/7 contact phone numbers for assistance for urgent and routine care needs. Service will only be billed when office clinical staff spend 20 minutes or more in a month to coordinate care. Only one practitioner may furnish and bill the service in a calendar month. The patient may stop CCM services at any time (effective at the end of the month) by phone call to the office staff.   Patient agreed to services and verbal consent obtained.   Follow up plan:   [name], [employer] food

## 2020-12-27 NOTE — Chronic Care Management (AMB) (Signed)
°  Chronic Care Management   Outreach Note  12/27/2020 Name: Anna Greene MRN: [id number] DOB: [birth year]  Referred by: Blane Ohara, MD Reason for referral : Chronic Care Management   An unsuccessful telephone outreach was attempted today. The patient was referred to the pharmacist for assistance with care management and care coordination.   Follow Up Plan:   Tatjana Dellinger [employer] Scheduler

## 2020-12-28 ENCOUNTER — Telehealth: Payer: Self-pay

## 2020-12-28 NOTE — Chronic Care Management (AMB) (Signed)
Chronic Care Management Pharmacy Assistant   Name: Anna Greene  MRN: [id number] DOB: 1955-08-01   Reason for Encounter: Chart review for CPP visit on 01-01-2021   Conditions to be addressed/monitored: HTN, HLD, DMII, Depression, Osteoporosis, and Osteoarthritis  Recent office visits:  12-26-2020 Blane Ohara, MD. INCREASE ozempic 0.5 mg weekly TO 1 mg weekly. Follow up in 6 weeks.  11-13-2020 Blane Ohara, MD. START ozempic Inject 0.25 mg into the skin once a week for 30 days, THEN 0.5 mg once a week. STOP Flexeril, claritin, prednisone and glimepiride.  10-09-2020 Janie Morning, NP. Glucose= 108, Albumin= 4.9, Albumin/Globulin= 2.3, ALT= 34. A1C= 6.4. Trig= 169. DG of right foot complete. STOP celebrex 3 days prior to surgery.  08-01-2020 Blane Ohara, MD. START Voltaren 50 mg twice daily, prednisone 50 mg daily for 5 days. STOP buspar and lexapro.  Recent consult visits:  12-08-2020 Alfonzo Beers, PA ([facility]). Visit for dysuria. STOP buspar, glimepiride, lexapro and claritin. Diclucan 150 mg one dose was given. START cefdinir 300 mg in the morning and 1 capsule before bedtime for 5 days.  10-31-2020 Gean Birchwood, MD (Orthopedic surgery). Unable to view encounter.  10-20-2020 Gean Birchwood, MD (Orthopedic surgery). Unable to view encounter.  09-28-2020 Gean Birchwood, MD (Orthopedic surgery). Unable to view encounter.  09-25-2020 Herbie Baltimore (Radiology). MRI lower extremity .  09-08-2020 Otho Darner, MD (Sports medicine). Unable to view encounter.  09-04-2020 Charisse Klinefelter (Cardiology). Unable to view encounter.  07-24-2020 Evelena Leyden, MD (Optometry). Unable to view encounter.  Hospital visits:  None in previous 6 months  Medications: Outpatient Encounter Medications as of 12/28/2020  Medication Sig   atenolol-chlorthalidone (TENORETIC) 100-25 MG tablet Take 1 tablet by mouth daily.   celecoxib (CELEBREX) 200 MG capsule Take 1 capsule (200  mg total) by mouth 2 (two) times daily.   cetirizine (ZYRTEC) 10 MG tablet Take 10 mg by mouth daily.   clonazePAM (KLONOPIN) 0.5 MG tablet TAKE 1 TABLET BY MOUTH ONCE DAILY AS NEEDED   diclofenac (VOLTAREN) 50 MG EC tablet Take 1 tablet (50 mg total) by mouth 2 (two) times daily.   gabapentin (NEURONTIN) 100 MG capsule Take 2 capsules (200 mg total) by mouth 2 (two) times daily.   losartan (COZAAR) 50 MG tablet Take by mouth.   omeprazole (PRILOSEC) 20 MG capsule Take 20 mg by mouth daily.   potassium chloride (KLOR-CON) 10 MEQ tablet Take 1 tablet (10 mEq total) by mouth once for 1 dose.   rosuvastatin (CRESTOR) 5 MG tablet Take 1 tablet (5 mg total) by mouth daily.   Semaglutide, 1 MG/DOSE, (OZEMPIC, 1 MG/DOSE,) 4 MG/3ML SOPN Inject 1 mg into the skin once a week.   No facility-administered encounter medications on file as of 12/28/2020.    Lab Results  Component Value Date/Time   HGBA1C 6.4 (H) 10/09/2020 09:23 AM   HGBA1C 6.0 (H) 02/21/2020 11:42 AM     BP Readings from Last 3 Encounters:  12/26/20 110/70  11/13/20 104/62  10/09/20 122/72      Have you seen any other providers since your last visit with PCP? No  Any changes in your medications or health? No  Any side effects from any medications? No  Do you have an symptoms or problems not managed by your medications? No  Any concerns about your health right now? No  Has your provider asked that you check blood pressure, blood sugar, or follow special diet at home? No  Do you get any  type of exercise on a regular basis? Yes patient stated she exercises on treadmill stationary bike daily.  Can you think of a goal you would like to reach for your health? Patient stated just to stay as healthy as possible  Do you have any problems getting your medications? No  Is there anything that you would like to discuss during the appointment? Patient stated nothing she can think of.   Anna Greene was reminded to have all  medications, supplements and any blood glucose and blood pressure readings available for review with Huntley Dec B. Clayborne Dana. D, at her telephone visit on 01-01-2021 at 11:00.    Care Gaps: Last annual wellness visit? 05-01-2020 Last eye exam / retinopathy screening? 07-24-2020 Last diabetic foot exam? Patient stated none because Dr. Sedalia Muta normally examines feet.   Star Rating Drugs: Ozempic 1 mg- Last filled 12-26-2020 42 DS Rosuvastatin 5 mg- Last filled 12-09-2020 90 DS Losartan 50 mg- Last filled 12-09-2020 90 DS  [facility] CMA Clinical Pharmacist Assistant [phone]

## 2020-12-29 NOTE — Chronic Care Management (AMB) (Signed)
° ° °  Chronic Care Management Pharmacy Assistant   Name: Anna Greene  MRN: [id number] DOB: [birth year]  Reason for Encounter: Patient Assistance Coordination  12/27/2020-  Called patient to follow up on Tier Exception forms received at Dr Sedalia Muta office. Dr Sedalia Muta signed forms but there were questions that needed to be answered that provider unable to answer due to patient being new to the practice. Spoke with patient, asked if she has tried and failed any other medications other than Clonazepam, Potassium, Atenolol/Chlorthalidone, Celebrex and Gabapentin. Per patient she could not remember if she has tried any failed any other medications than Clonazepam, she has only used Potassium and Atenolol/Chlorthalidone and Gabapentin. Patient has tried and failed Mobic and Meloxicam before being placed on Celebrex. Patient aware I will document what was discussed and fax over forms to [place]. After reviewing forms a little more, questions not needed to be answered. Dr. Sedalia Muta aware.   12/28/2020- Faxed Tier Exception forms to [employer].   12/29/2020- Checked status of faxed Tier exception forms, fax failed on 12/28/2020, tried again to fax. Fax failed again, called [employer] to verify fax number. Spoke with representative, she provided me with 2 numbers to fax forms. [phone] and [phone]. Faxed forms to both numbers while on the phone with representative, forms are being send but will follow up next week to make sure faxes were successfully received.  Patient has an initial visit with Artelia Laroche, Pharm D on 01/01/2021.   01/01/2021- Verified faxes, both went through and sent to [facility].   Medications: Outpatient Encounter Medications as of 12/27/2020  Medication Sig   atenolol-chlorthalidone (TENORETIC) 100-25 MG tablet Take 1 tablet by mouth daily.   celecoxib (CELEBREX) 200 MG capsule Take 1 capsule (200 mg total) by mouth 2 (two) times daily.   cetirizine (ZYRTEC) 10 MG tablet Take 10 mg by mouth daily.    clonazePAM (KLONOPIN) 0.5 MG tablet TAKE 1 TABLET BY MOUTH ONCE DAILY AS NEEDED   diclofenac (VOLTAREN) 50 MG EC tablet Take 1 tablet (50 mg total) by mouth 2 (two) times daily.   gabapentin (NEURONTIN) 100 MG capsule Take 2 capsules (200 mg total) by mouth 2 (two) times daily.   losartan (COZAAR) 50 MG tablet Take by mouth.   omeprazole (PRILOSEC) 20 MG capsule Take 20 mg by mouth daily.   rosuvastatin (CRESTOR) 5 MG tablet Take 1 tablet (5 mg total) by mouth daily.   Semaglutide, 1 MG/DOSE, (OZEMPIC, 1 MG/DOSE,) 4 MG/3ML SOPN Inject 1 mg into the skin once a week.   No facility-administered encounter medications on file as of 12/27/2020.   Billee Cashing, CMA Clinical Pharmacist Assistant [phone]

## 2021-01-01 ENCOUNTER — Ambulatory Visit (INDEPENDENT_AMBULATORY_CARE_PROVIDER_SITE_OTHER): Payer: Medicare HMO

## 2021-01-01 NOTE — Progress Notes (Signed)
Chronic Care Management Pharmacy Note  01/01/2021 Name:  Anna Greene MRN:  [id number] DOB:  1955-12-31  Summary: -Pleasant 65 year old female presents for initial CCM visit. 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 She has been working ever since she was 65 years old. 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 In her free time she loves to camp, has an RV, and loves to travel in it. Her favourite place she's been was [place] in [place] where there was a campground by a river. 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 year old female who is a primary patient of Cox, Kirsten, MD.  The CCM team was consulted for assistance with disease management and care coordination needs.    Engaged with patient by telephone for initial visit in response to provider referral for pharmacy case management and/or care coordination services.   Consent to Services:  The patient was given the following information about Chronic Care Management services today, agreed to services, and gave verbal consent: 1. CCM service includes personalized support from designated clinical staff supervised by the primary care provider, including individualized plan of care and coordination with other care providers 2. 24/7 contact phone numbers for assistance for urgent and routine care needs. 3. Service will only be billed when office clinical staff spend 20 minutes or more in a month to  coordinate care. 4. Only one practitioner may furnish and bill the service in a calendar month. 5.The patient may stop CCM services at any time (effective at the end of the month) by phone call to the office staff. 6. The patient will be responsible for cost sharing (co-pay) of up to 20% of the service fee (after annual deductible is met). Patient agreed to services and consent obtained.  Patient Care Team: CoRochel BromeMD as PCP - General (Family Medicine) [facility] Pharmacist (Pharmacist)  Recent office visits:  12-26-2020 CoRochel BromeMD. INCREASE ozempic 0.5 mg weekly TO 1 mg weekly. Follow up in 6 weeks.   11-13-2020 CoRochel BromeMD. START ozempic Inject 0.25 mg into the skin once a week for 30 days, THEN 0.5 mg once a week. STOP Flexeril, claritin, prednisone and glimepiride.   10-09-2020 HeRip HarbourNP. Glucose= 108, Albumin= 4.9, Albumin/Globulin= 2.3, ALT= 34. A1C= 6.4. Trig= 169. DG of right foot complete. STOP celebrex 3 days prior to surgery.   08-01-2020 CoRochel BromeMD. START Voltaren 50 mg twice daily, prednisone 50 mg daily for 5 days. STOP buspar and lexapro.   Recent consult visits:  12-08-2020 Anna AntiguaPA ([facility] Visit for dysuria. STOP buspar, glimepiride, lexapro and claritin. Diclucan 150 mg one dose was given. START cefdinir 300 mg in the morning and 1 capsule before bedtime for 5 days.   10-31-2020 Anna PearMD (Orthopedic surgery). Unable to view encounter.   10-20-2020 Anna PearMD (Orthopedic surgery). Unable to view encounter.  09-28-2020 Anna Pear, MD (Orthopedic surgery). Unable to view encounter.   09-25-2020 Anna Greene (Radiology). MRI lower extremity .   09-08-2020 Anna Fitz, MD (Sports medicine). Unable to view encounter.   09-04-2020 Anna Greene (Cardiology). Unable to view encounter.   07-24-2020 Anna Blew, MD (Optometry). Unable to view encounter.   Hospital visits:   None in previous 6 months   Objective:  Lab Results  Component Value Date   CREATININE 0.91 10/09/2020   BUN 19 10/09/2020   GFRNONAA 74 02/21/2020   GFRAA 85 02/21/2020   NA 140 10/09/2020   K 3.6 10/09/2020   CALCIUM 9.6 10/09/2020   CO2 24 10/09/2020   GLUCOSE 108 (H) 10/09/2020    Lab Results  Component Value Date/Time   HGBA1C 6.4 (H) 10/09/2020 09:23 AM   HGBA1C 6.0 (H) 02/21/2020 11:42 AM    Last diabetic Eye exam: No results found for: HMDIABEYEEXA  Last diabetic Foot exam: No results found for: HMDIABFOOTEX   Lab Results  Component Value Date   CHOL 131 10/09/2020   HDL 48 10/09/2020   LDLCALC 55 10/09/2020   TRIG 169 (H) 10/09/2020   CHOLHDL 2.7 02/21/2020    Hepatic Function Latest Ref Rng & Units 10/09/2020 02/21/2020  Total Protein 6.0 - 8.5 g/dL 7.0 6.8  Albumin 3.8 - 4.8 g/dL 4.9(H) 4.5  AST 0 - 40 IU/L 24 23  ALT 0 - 32 IU/L 34(H) 17  Alk Phosphatase 44 - 121 IU/L 94 89  Total Bilirubin 0.0 - 1.2 mg/dL 0.3 0.4    Lab Results  Component Value Date/Time   TSH 0.948 02/21/2020 11:42 AM    CBC Latest Ref Rng & Units 10/09/2020 02/21/2020  WBC 3.4 - 10.8 x10E3/uL 7.9 6.2  Hemoglobin 11.1 - 15.9 g/dL 13.4 13.6  Hematocrit 34.0 - 46.6 % 41.8 41.1  Platelets 150 - 450 x10E3/uL 314 272    No results found for: VD25OH  Clinical ASCVD: Yes  The 10-year ASCVD risk score (Arnett DK, et al., 2019) is: 11.2%   Values used to calculate the score:     Age: 7 years     Sex: Female     Is Non-Hispanic African American: No     Diabetic: Yes     Tobacco smoker: No     Systolic Blood Pressure: 585 mmHg     Is BP treated: Yes     HDL Cholesterol: 48 mg/dL     Total Cholesterol: 131 mg/dL    Depression screen [facility] 2/9 11/13/2020 08/01/2020 05/01/2020  Decreased Interest 0 0 0  Down, Depressed, Hopeless 0 0 0  PHQ - 2 Score 0 0 0  Altered sleeping 0 0 -  Tired, decreased energy 0 0 -  Change in appetite 0 0 -  Feeling bad or failure about yourself  0 0 -   Trouble concentrating 0 0 -  Moving slowly or fidgety/restless 0 0 -  Suicidal thoughts 0 0 -  PHQ-9 Score 0 0 -  Difficult doing work/chores Not difficult at all Not difficult at all -     Other: (CHADS2VASc if Afib, MMRC or CAT for COPD, ACT, DEXA)  Social History   Tobacco Use  Smoking Status Never  Smokeless Tobacco Never   BP Readings from Last 3 Encounters:  12/26/20 110/70  11/13/20 104/62  10/09/20 122/72   Pulse Readings from Last 3 Encounters:  12/26/20 88  11/13/20 74  10/09/20 74   Wt Readings from Last 3 Encounters:  12/26/20 217 lb (98.4 kg)  11/13/20 216 lb (98 kg)  10/09/20 217 lb (98.4 kg)   BMI Readings from Last 3 Encounters:  12/26/20 35.02 kg/m  11/13/20 34.86 kg/m  10/09/20 35.02 kg/m    Assessment/Interventions: Review of patient past medical history, allergies, medications, health status, including review of consultants reports, laboratory and other test data, was performed as part of comprehensive evaluation and provision of chronic care management services.   SDOH:  (Social Determinants of Health) assessments and interventions performed: Yes  SDOH Screenings   Alcohol Screen: Low Risk    Last Alcohol Screening Score (AUDIT): 0  Depression (PHQ2-9): Low Risk    PHQ-2 Score: 0  Financial Resource Strain: Not on file  Food Insecurity: Not on file  Housing: Not on file  Physical Activity: Not on file  Social Connections: Not on file  Stress: Not on file  Tobacco Use: Low Risk    Smoking Tobacco Use: Never   Smokeless Tobacco Use: Never   Passive Exposure: Not on file  Transportation Needs: Not on file    [place]  Allergies  Allergen Reactions   Metformin And Related     Diarrhea   Ramipril     Cough   Tuberculin     False positive    Medications Reviewed Today     Reviewed by Lane Greene, [facility] (Pharmacist) on 01/01/21 at [place] List Status: <None>   Medication Order Taking? Sig Documenting Provider  Last Dose Status Informant  atenolol-chlorthalidone (TENORETIC) 100-25 MG tablet [id number] Yes Take 1 tablet by mouth daily. [provider] Taking Active   celecoxib (CELEBREX) 200 MG capsule [id number] Yes Take 1 capsule (200 mg total) by mouth 2 (two) times daily. Cox, Kirsten, MD Taking Active   cetirizine (ZYRTEC) 10 MG tablet [id number] Yes Take 10 mg by mouth daily. [provider] Taking Active   clonazePAM (KLONOPIN) 0.5 MG tablet [id number] Yes TAKE 1 TABLET BY MOUTH ONCE DAILY AS NEEDED Cox, Kirsten, MD Taking Active   diclofenac (VOLTAREN) 50 MG EC tablet [id number]  Take 1 tablet (50 mg total) by mouth 2 (two) times daily. Cox, Kirsten, MD  Active   gabapentin (NEURONTIN) 100 MG capsule [id number] Yes Take 2 capsules (200 mg total) by mouth 2 (two) times daily. Cox, Kirsten, MD Taking Active   losartan (COZAAR) 50 MG tablet [id number] Yes Take by mouth. [provider] Taking Active   omeprazole (PRILOSEC) 20 MG capsule [id number] Yes Take 20 mg by mouth daily. [provider] Taking Active   potassium chloride (KLOR-CON) 10 MEQ tablet [id number]  Take 1 tablet (10 mEq total) by mouth once for 1 dose. Rochel Brome, MD  Expired 02/15/20 2359   rosuvastatin (CRESTOR) 5 MG tablet [id number] Yes Take 1 tablet (5 mg total) by mouth daily. Cox, Kirsten, MD Taking Active   Semaglutide, 1 MG/DOSE, (OZEMPIC, 1 MG/DOSE,) 4 MG/3ML SOPN [id number] Yes Inject 1 mg into the skin once a week. Rochel Brome, MD Taking Active             Patient Active Problem List   Diagnosis Date Noted   Plantar fasciitis 11/14/2020   Class 1 obesity due to excess calories with serious comorbidity and body mass index (BMI) of 34.0 to 34.9 in adult 11/14/2020   Gastroesophageal reflux disease without esophagitis 11/14/2020   Degeneration of lumbar intervertebral disc 05/01/2020   Localized, primary osteoarthritis 05/01/2020   Low back pain 05/01/2020   Lumbar radiculopathy 05/01/2020  Osteoarthritis of knee 05/01/2020   Mixed anxiety and depressive disorder 02/18/2020   Combined hyperlipidemia associated with type 2 diabetes mellitus ([place]) 02/18/2020   Hypertension associated with diabetes ([place]) 02/18/2020   Osteoporosis 02/18/2020   Hyperlipidemia 02/18/2020   Foot pain 06/22/2010    Immunization History  Administered Date(s) Administered   Influenza, Quadrivalent, Recombinant, Inj, Pf 10/23/2016   Influenza-Unspecified 11/15/2019, 10/31/2020   Moderna Sars-Covid-2 Vaccination 03/11/2019, 04/14/2019, 11/15/2019, 10/31/2020   Pneumococcal Conjugate-13 10/08/2018   Pneumococcal Polysaccharide-23 02/15/2020   Tdap 08/03/2008, 02/15/2013    Conditions to be addressed/monitored:  Hypertension, Hyperlipidemia, Diabetes, and GERD  There are no care plans that you recently modified to display for this patient.    Medication Assistance: None required.  Patient affirms current coverage meets needs.  Compliance/Adherence/Medication fill history: Care Gaps: Last annual wellness visit? 05-01-2020 Last eye exam / retinopathy screening? 07-24-2020 Last diabetic foot exam? Patient stated none because Dr. Tobie Poet normally examines feet.     Star Rating Drugs: Ozempic 1 mg- Last filled 12-26-2020 42 DS Rosuvastatin 5 mg- Last filled 12-09-2020 90 DS Losartan 50 mg- Last filled 12-09-2020 90 DS  Patient's preferred pharmacy is:  [facility] [address] [place] [address] [place] [address] Phone: [phone] Fax: [phone]  Uses pill box? No -   Pt endorses 100% compliance  We discussed: Benefits of medication synchronization, packaging and delivery as well as enhanced pharmacist oversight with [facility]. Patient decided to: Utilize [facility] pharmacy for medication synchronization, packaging and delivery Verbal consent obtained for [facility] enhanced pharmacy services (medication synchronization, adherence  packaging, delivery coordination). A medication sync plan was created to allow patient to get all medications delivered once every 30 to 90 days per patient preference. Patient understands they have freedom to choose pharmacy and clinical pharmacist will coordinate care between all prescribers and [facility].   Care Plan and Follow Up Patient Decision:  Patient agrees to Care Plan and Follow-up.  Plan: The patient has been provided with contact information for the care management team and has been advised to call with any health related questions or concerns.   CPP F/U 6 months  [place] Constable, Pharm.D. - [phone]

## 2021-01-01 NOTE — Patient Instructions (Signed)
Visit Information   Goals Addressed             This Visit's Progress    Set My Target A1C-Diabetes Type 2       Timeframe:  Long-Range Goal Priority:  High Start Date:                             Expected End Date:                       Follow Up Date 01/01/22   - set target A1C    Why is this important?   Your target A1C is decided together by you and your doctor.  It is based on several things like your age and other health issues.    Notes:      Track and Manage My Blood Pressure-Hypertension       Timeframe:  Long-Range Goal Priority:  High Start Date:                             Expected End Date:                       Follow Up Date 01/01/22   - choose a place to take my blood pressure (home, clinic or office, retail store) - write blood pressure results in a log or diary    Why is this important?   You won't feel high blood pressure, but it can still hurt your blood vessels.  High blood pressure can cause heart or kidney problems. It can also cause a stroke.  Making lifestyle changes like losing a little weight or eating less salt will help.  Checking your blood pressure at home and at different times of the day can help to control blood pressure.  If the doctor prescribes medicine remember to take it the way the doctor ordered.  Call the office if you cannot afford the medicine or if there are questions about it.     Notes:        Patient Care Plan: CCM Pharmacy Care Plan     Problem Identified: DM, Lipids, Cardio, GERD   Priority: High  Onset Date: 01/01/2021     Long-Range Goal: Disease State Management   Start Date: 01/01/2021  Expected End Date: 01/01/2022  This Visit's Progress: On track  Priority: High  Note:   Current Barriers:  Does not maintain contact with provider office Does not contact provider office for questions/concerns  Pharmacist Clinical Goal(s):  Patient will contact provider office for questions/concerns as evidenced  notation of same in electronic health record through collaboration with PharmD and provider.   Interventions: 1:1 collaboration with Rochel Brome, MD regarding development and update of comprehensive plan of care as evidenced by provider attestation and co-signature Inter-disciplinary care team collaboration (see longitudinal plan of care) Comprehensive medication review performed; medication list updated in electronic medical record  Hypertension (BP goal <140/90) BP Readings from Last 3 Encounters:  12/26/20 110/70  11/13/20 104/62  10/09/20 122/72  -Controlled -Current treatment: Atenolol/Chlorthalidone 100/25 QD Losartan 90m QD Kcl 178m -Medications previously tried: N/A  -Current home readings: Didn't have readings -Current dietary habits: "Tries to eat healthy" -Current exercise habits: Works as grEngineer, sitex/week for 5 hour shifts Denies hypotensive/hypertensive symptoms -Educated on BP goals and benefits of medications for prevention of heart attack, stroke and  kidney damage; -Counseled to monitor BP at home PRN, document, and provide log at future appointments -Recommended to continue current medication  Hyperlipidemia: (LDL goal < 70) The 10-year ASCVD risk score (Arnett DK, et al., 2019) is: 11.2%   Values used to calculate the score:     Age: 65 years     Sex: Female     Is Non-Hispanic African American: No     Diabetic: Yes     Tobacco smoker: No     Systolic Blood Pressure: 401 mmHg     Is BP treated: Yes     HDL Cholesterol: 48 mg/dL     Total Cholesterol: 131 mg/dL Lab Results  Component Value Date   CHOL 131 10/09/2020   CHOL 154 02/21/2020   Lab Results  Component Value Date   HDL 48 10/09/2020   HDL 58 02/21/2020   Lab Results  Component Value Date   LDLCALC 55 10/09/2020   LDLCALC 73 02/21/2020   Lab Results  Component Value Date   TRIG 169 (H) 10/09/2020   TRIG 131 02/21/2020   Lab Results  Component Value Date   CHOLHDL 2.7 02/21/2020   No results found for: LDLDIRECT -Controlled -Current treatment: Rosuvastatin 50m -Medications previously tried: N/A  -Current dietary patterns: "Tries to eat healthy" -Current exercise habits: Works as gEngineer, site4x/week for 5 hour shifts -Educated on Cholesterol goals;  -Recommended to continue current medication  Diabetes (A1c goal <7%) Lab Results  Component Value Date   HGBA1C 6.4 (H) 10/09/2020   HGBA1C 6.0 (H) 02/21/2020   Lab Results  Component Value Date   LDLCALC 55 10/09/2020   CREATININE 0.91 10/09/2020   Lab Results  Component Value Date   NA 140 10/09/2020   K 3.6 10/09/2020   CREATININE 0.91 10/09/2020   EGFR 69 10/09/2020   GFRNONAA 74 02/21/2020   GLUCOSE 108 (H) 10/09/2020   Lab Results  Component Value Date   WBC 7.9 10/09/2020   HGB 13.4 10/09/2020   HCT 41.8 10/09/2020   MCV 89 10/09/2020   PLT 314 10/09/2020  -Controlled -Current medications: Ozempic 124mweek -Medications previously tried: Metformin  -Current home glucose readings fasting glucose: Doesn't test post prandial glucose: Doesn't test -Denies hypoglycemic/hyperglycemic symptoms -Current exercise habits: Works as grEngineer, sitex/week for 5 hour shifts -Educated on A1c and blood sugar goals; -Counseled to check feet daily and get yearly eye exams -Recommended to continue current medication  GERD (Goal: minimize symptoms of reflux ) -Controlled -Current treatment  Omeprazole 2057malcium 600m55mVitD3 (20mc68mMedications previously tried: none reported  -Triggering factors: Daily occurrence, "Burning in my esophagus if I don't take it" -Hx of Bleeds/ulcers: No -Counseled on small meals, elevating head, and sleeping on left side -Recommended to continue current medication and to separate this and her Women's MVI   Chronic Pain (Plantar Fasciitis, Right knee replaced 2019, and 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) -Controlled -Current treatment: Gabepentin 200mg 12mCelecoxib 200mg B68mMedications  previously tried: N/A  -Pain Scale Today's Vitals   01/01/21 1200  PainSc: 5     Aggravating Factors: Daily use  Pain Type: Neuropathy (From Sx) and musc/Skel)  December 2022: Patient told me, "Gabapentin used to work but doesn't as much anymore." Will ask PCP to increase dose   Patient Goals/Self-Care Activities Patient will:  - take medications as prescribed as evidenced by patient report and record review  Follow Up Plan: The patient has been provided with contact information for the care management team and has  been advised to call with any health related questions or concerns.   CPP F/U 6 Months  [place] Constable, [place].D. - [phone]        The patient verbalized understanding of instructions, educational materials, and care plan provided today and declined offer to receive copy of patient instructions, educational materials, and care plan.  The pharmacy team will reach out to the patient again over the next 90 days.   Lane Hacker, [facility]

## 2021-01-09 ENCOUNTER — Telehealth: Payer: Self-pay

## 2021-01-09 NOTE — Chronic Care Management (AMB) (Signed)
° ° °  Chronic Care Management Pharmacy Assistant   Name: Anna Greene  MRN: [id number] DOB: 25-Jul-1955  Reason for Encounter: Enhanced Pharmacy Coordination.  01/09/2021- Returned patient call regarding Ozempic coupon card that patient uses to get medication for $25 dollars a month. Patient left coupon information on my voicemail. Patient will be using [facility] services. Relayed coupon information to [employer], Ozempic card number: ID# [id number] BIN: [id number]  PCN: [id number]  Group# [id number]. Called patient to inform, left message to return call if need.     Medications: Outpatient Encounter Medications as of 01/09/2021  Medication Sig   atenolol-chlorthalidone (TENORETIC) 100-25 MG tablet Take 1 tablet by mouth daily.   celecoxib (CELEBREX) 200 MG capsule Take 1 capsule (200 mg total) by mouth 2 (two) times daily.   cetirizine (ZYRTEC) 10 MG tablet Take 10 mg by mouth daily.   clonazePAM (KLONOPIN) 0.5 MG tablet TAKE 1 TABLET BY MOUTH ONCE DAILY AS NEEDED   diclofenac (VOLTAREN) 50 MG EC tablet Take 1 tablet (50 mg total) by mouth 2 (two) times daily.   gabapentin (NEURONTIN) 100 MG capsule Take 2 capsules (200 mg total) by mouth 2 (two) times daily.   losartan (COZAAR) 50 MG tablet Take by mouth.   omeprazole (PRILOSEC) 20 MG capsule Take 20 mg by mouth daily.   potassium chloride (KLOR-CON) 10 MEQ tablet Take 1 tablet (10 mEq total) by mouth once for 1 dose.   rosuvastatin (CRESTOR) 5 MG tablet Take 1 tablet (5 mg total) by mouth daily.   Semaglutide, 1 MG/DOSE, (OZEMPIC, 1 MG/DOSE,) 4 MG/3ML SOPN Inject 1 mg into the skin once a week.   No facility-administered encounter medications on file as of 01/09/2021.   Billee Cashing, CMA Clinical Pharmacist Assistant [phone]

## 2021-01-12 ENCOUNTER — Encounter: Payer: Self-pay | Admitting: Family Medicine

## 2021-01-12 ENCOUNTER — Other Ambulatory Visit: Payer: Self-pay | Admitting: Family Medicine

## 2021-01-12 DIAGNOSIS — K219 Gastro-esophageal reflux disease without esophagitis: Secondary | ICD-10-CM

## 2021-01-12 DIAGNOSIS — M5136 Other intervertebral disc degeneration, lumbar region: Secondary | ICD-10-CM

## 2021-01-12 DIAGNOSIS — E782 Mixed hyperlipidemia: Secondary | ICD-10-CM

## 2021-01-12 DIAGNOSIS — E1159 Type 2 diabetes mellitus with other circulatory complications: Secondary | ICD-10-CM

## 2021-01-12 MED ORDER — ATENOLOL-CHLORTHALIDONE 100-25 MG PO TABS: 1.0000 | ORAL_TABLET | Freq: Every day | ORAL | 1 refills | Status: DC

## 2021-01-12 MED ORDER — OMEPRAZOLE 20 MG PO CPDR
20.0000 mg | DELAYED_RELEASE_CAPSULE | Freq: Every day | ORAL | 1 refills | Status: DC
Start: 2021-01-12 — End: 2021-09-06

## 2021-01-12 MED ORDER — LOSARTAN POTASSIUM 50 MG PO TABS: 50.0000 mg | ORAL_TABLET | Freq: Every day | ORAL | 1 refills | Status: DC

## 2021-01-12 MED ORDER — CELECOXIB 200 MG PO CAPS: 200.0000 mg | ORAL_CAPSULE | Freq: Two times a day (BID) | ORAL | 0 refills | Status: DC

## 2021-01-12 MED ORDER — GABAPENTIN 300 MG PO CAPS: 300.0000 mg | ORAL_CAPSULE | Freq: Two times a day (BID) | ORAL | 0 refills | Status: DC

## 2021-01-12 MED ORDER — POTASSIUM CHLORIDE ER 10 MEQ PO TBCR: 10.0000 meq | EXTENDED_RELEASE_TABLET | Freq: Once | ORAL | 1 refills | Status: DC

## 2021-01-12 MED ORDER — OZEMPIC (1 MG/DOSE) 4 MG/3ML ~~LOC~~ SOPN: 1.0000 mg | PEN_INJECTOR | SUBCUTANEOUS | 1 refills | Status: DC

## 2021-01-12 MED ORDER — ROSUVASTATIN CALCIUM 5 MG PO TABS
5.0000 mg | ORAL_TABLET | Freq: Every day | ORAL | 0 refills | Status: DC
Start: 2021-01-12 — End: 2021-05-10

## 2021-01-12 NOTE — Assessment & Plan Note (Signed)
Recommend continue to work on eating healthy diet and exercise. Increase ozempic to 1 mg once weekly.

## 2021-01-12 NOTE — Assessment & Plan Note (Signed)
Increase ozempic 1 mg once daily.

## 2021-01-12 NOTE — Assessment & Plan Note (Signed)
Risks were discussed including bleeding, infection, increase in sugars if diabetic, atrophy at site of injection, and increased pain.  After consent was obtained, using sterile technique the knee was prepped with alcohol. The plantar fascia of the right foot was injected form medial side with Kenalog 20 mg and 3 ml plain Lidocaine was then injected and the needle withdrawn.  The procedure was well tolerated.   The patient is asked to continue to rest the joint for a few more days before resuming regular activities.  It may be more painful for the first 1-2 days.  Watch for fever, or increased swelling or persistent pain in the joint. Call or return to clinic prn if such symptoms occur or there is failure to improve as anticipated.

## 2021-01-13 DIAGNOSIS — E1159 Type 2 diabetes mellitus with other circulatory complications: Secondary | ICD-10-CM

## 2021-01-13 DIAGNOSIS — I152 Hypertension secondary to endocrine disorders: Secondary | ICD-10-CM

## 2021-01-13 DIAGNOSIS — E785 Hyperlipidemia, unspecified: Secondary | ICD-10-CM

## 2021-01-13 DIAGNOSIS — E782 Mixed hyperlipidemia: Secondary | ICD-10-CM

## 2021-01-13 DIAGNOSIS — E1169 Type 2 diabetes mellitus with other specified complication: Secondary | ICD-10-CM

## 2021-01-16 ENCOUNTER — Telehealth: Payer: Self-pay

## 2021-01-16 NOTE — Chronic Care Management (AMB) (Signed)
Chronic Care Management Pharmacy Assistant   Name: Anna Greene  MRN: [id number] DOB: [birth year]   Reason for Encounter: Medication Review   01/16/2021- Patient called and left a message regarding medications, called patient back to help with questions, no answer, left message to return call.  Patient called back, missed call. Called patient back, she wanted to let us know that [employer] approved tier exceptions for all of her medications except the Potassium, Patient will bring letter into Dr Sedalia Muta office for me to review Thursday. Patient aware I will ask [facility] to run a test claim on the potassium to see how much it will cost, if too expensive for patient, will ask Harrold Donath what he suggest on OTC dosage. Patient also inquired if she would be able to get her OTC meds- Calcium, magnesium and collagen from [facility]. Informed if we have on hand we can also deliver with her medications. Patient states she gets a yearly allowance on these OTC meds with Monia Pouch, she has already picked them up from [facility] and will let us know when she would like them to come from [facility]. Patient also asked about Clonazepam prescription that should be delivered soon, checked profile, no prescription seen, advised patient I will send task to Dr Sedalia Muta CMA for refill. Request sent to Cheron Every, CMA for refills to be sent to [facility]. Patient also wondered if Dr Sedalia Muta agreed to Gabapentin change recommened by Artelia Laroche, Pharm D. Informed that Dr Sedalia Muta sent in Gabapentin 300 mg 1 tablet twice daily, recommended dose from [place] [facility]. Patient aware I will call her regarding Potassium and first delivery date.  Spoke with [facility], unfortunately they were unable to run a test claim on patient due to medication just being filled, she will not be able to do so until 03/15/21.   01/18/2021- Received [employer] Tier Exception denial and approval form from [employer].  01/19/2021- Called patient to inform about  Potassium, forms received and Clonazepam. Clonazepam not sent to [facility] as of today, checked patients prescription, looks like she has 1 more refill left that she is able to pick up from previous pharmacy which would be recommended due to it being a controlled substance. No answer from patient, waiting on return call.  Patient returned call, she is aware we will have to wait on price for Potassium, she would like to move forward on appeal with [employer] for this medication. Patient aware I will work on this. Patient also informed me that she takes her last Ozempic shot on Sunday, checked onboard form and it looks like she was not due for delivery until 02/06/21 but as I checked dispensing history, last dose that was sent to the [facility] was for a 0.25/0.5 mg pen. Patient injects 1 mg once a week so she would be out before DS noted. Patient also wants to know if she can get the new prescription sent in by Dr Cox for the Gabapentin 300 mg bid next week to. Patient aware we can get those delivered next week. Patient also aware that she should pick up her last refill of Clonazepam prescription from [facility] and the new prescription will be placed on hold at [facility]. I will contact patient next week with any updates on appeal for Potassium. Sent request to [facility] to send Gabapentin 300 mg bid and Ozempic 4mg / 3 ml qwk to patient 01/22/2021.   01/23/2021- Receive message from [facility] requesting copay assistance card information for patient Ozempic, information  sent over. [facility] notified that patient called them and this card will not apply due to new insurance this year. They are ordering her Ozempic for delivery on 01/24/2021.   01/26/2021- Potassium appeal sent to [facility] Department, awaiting determination.  Medications: Outpatient Encounter Medications as of 01/16/2021  Medication Sig   atenolol-chlorthalidone (TENORETIC) 100-25 MG tablet Take 1 tablet by mouth daily.    celecoxib (CELEBREX) 200 MG capsule Take 1 capsule (200 mg total) by mouth 2 (two) times daily.   cetirizine (ZYRTEC) 10 MG tablet Take 10 mg by mouth daily.   clonazePAM (KLONOPIN) 0.5 MG tablet TAKE 1 TABLET BY MOUTH ONCE DAILY AS NEEDED   gabapentin (NEURONTIN) 300 MG capsule Take 1 capsule (300 mg total) by mouth 2 (two) times daily.   losartan (COZAAR) 50 MG tablet Take 1 tablet (50 mg total) by mouth daily.   omeprazole (PRILOSEC) 20 MG capsule Take 1 capsule (20 mg total) by mouth daily.   potassium chloride (KLOR-CON) 10 MEQ tablet Take 1 tablet (10 mEq total) by mouth once for 1 dose.   rosuvastatin (CRESTOR) 5 MG tablet Take 1 tablet (5 mg total) by mouth daily.   Semaglutide, 1 MG/DOSE, (OZEMPIC, 1 MG/DOSE,) 4 MG/3ML SOPN Inject 1 mg into the skin once a week.   No facility-administered encounter medications on file as of 01/16/2021.    Billee Cashing, CMA Clinical Pharmacist Assistant [phone]

## 2021-01-24 ENCOUNTER — Other Ambulatory Visit: Payer: Self-pay | Admitting: Family Medicine

## 2021-01-24 DIAGNOSIS — F411 Generalized anxiety disorder: Secondary | ICD-10-CM

## 2021-01-24 MED ORDER — CLONAZEPAM 0.5 MG PO TABS: 0.5000 mg | ORAL_TABLET | Freq: Every day | ORAL | 2 refills | Status: DC | PRN

## 2021-02-04 NOTE — Progress Notes (Signed)
Subjective:  Patient ID: Anna Greene, female    DOB: 1955-08-26  Age: 66 y.o. MRN: [id number]  Chief Complaint  Patient presents with   Diabetes   Hyperlipidemia   Hypertension    HPI Diabetes Mellitus Type II, Follow-up Last seen for diabetes 3 months ago.  Management since then includes recommend decrease any sugar sweetened beverages, concentrated sweets, or any alcohol in diet. She is on Ozempic 1 mg weekly.  She reports excellent compliance with treatment. She is not having side effects.  She checks feet daily. She is complaining of a rash on her left foot after getting a pedicure.  Home blood sugar records: not checking Episodes of hypoglycemia? No   Lab Results  Component Value Date   HGBA1C 6.3 (H) 02/05/2021   HGBA1C 6.4 (H) 10/09/2020   HGBA1C 6.0 (H) 02/21/2020   Wt Readings from Last 3 Encounters:  02/05/21 214 lb (97.1 kg)  12/26/20 217 lb (98.4 kg)  11/13/20 216 lb (98 kg)   No components found for: MICROALB/CREAT RATIO }  Pertinent Labs: Lab Results  Component Value Date   CHOL 129 02/05/2021   HDL 56 02/05/2021   LDLCALC 54 02/05/2021   TRIG 105 02/05/2021   CHOLHDL 2.3 02/05/2021   Lab Results  Component Value Date   NA 139 02/05/2021   K 4.9 02/05/2021   CREATININE 1.01 (H) 02/05/2021   EGFR 61 02/05/2021   GFRNONAA 74 02/21/2020   GLUCOSE 103 (H) 02/05/2021      Hypertension: Currently taking Losartan 50 mg daily, Tenoretic 100-25 mg daily. Hyperlipidemia:  Patient is taking Rosuvastatin 5 mg daily. Anxiety: Klonopin 0.5 mg daily PRN. URI: sinus congestion and sore throat on one side since Friday. No fever, chills, sweats, coughing. Some sinus headache.   Current Outpatient Medications on File Prior to Visit  Medication Sig Dispense Refill   atenolol-chlorthalidone (TENORETIC) 100-25 MG tablet Take 1 tablet by mouth daily. 90 tablet 1   celecoxib (CELEBREX) 200 MG capsule Take 1 capsule (200 mg total) by mouth 2 (two) times daily. 180  capsule 0   cetirizine (ZYRTEC) 10 MG tablet Take 10 mg by mouth daily.     clonazePAM (KLONOPIN) 0.5 MG tablet Take 1 tablet (0.5 mg total) by mouth daily as needed. 30 tablet 2   gabapentin (NEURONTIN) 300 MG capsule Take 1 capsule (300 mg total) by mouth 2 (two) times daily. 180 capsule 0   losartan (COZAAR) 50 MG tablet Take 1 tablet (50 mg total) by mouth daily. 90 tablet 1   omeprazole (PRILOSEC) 20 MG capsule Take 1 capsule (20 mg total) by mouth daily. 90 capsule 1   rosuvastatin (CRESTOR) 5 MG tablet Take 1 tablet (5 mg total) by mouth daily. 90 tablet 0   Semaglutide, 1 MG/DOSE, (OZEMPIC, 1 MG/DOSE,) 4 MG/3ML SOPN Inject 1 mg into the skin once a week. 9 mL 1   potassium chloride (KLOR-CON) 10 MEQ tablet Take 1 tablet (10 mEq total) by mouth once for 1 dose. 90 tablet 1   No current facility-administered medications on file prior to visit.   Past Medical History:  Diagnosis Date   Benign fasciculation-cramp syndrome    Depression    Diabetes mellitus without complication (HCC)    GERD (gastroesophageal reflux disease)    Hyperlipidemia    Hypertension    Nephrolithiasis    Osteoarthritis    Bilateral knees, degenerative disc disease.  Patient has had epidural steroid injections x2   Plantar fasciitis  Past Surgical History:  Procedure Laterality Date   BREAST REDUCTION SURGERY Bilateral 1996   CESAREAN SECTION  1976   CESAREAN SECTION  1979   KNEE ARTHROSCOPY W/ MENISCAL REPAIR Left 1995   Per patient muscle was repaired   KNEE ARTHROSCOPY W/ MENISCAL REPAIR Right 2003   knee surgery Left 10/2020   meniscal repair via arthroscopy   PANNICULECTOMY     REDUCTION MAMMAPLASTY  1996   REPLACEMENT TOTAL KNEE Right 2019   TONSILLECTOMY  1960    Family History  Problem Relation Age of Onset   Stroke Mother    Heart disease Mother    Cancer Father    Diabetes Sister    Stroke Brother    Anxiety disorder Sister    Depression Sister    Diabetes Sister    Diabetes  Sister    Arthritis Sister    Social History   Socioeconomic History   Marital status: Married    Spouse name: Not on file   Number of children: Not on file   Years of education: Not on file   Highest education level: Not on file  Occupational History   Not on file  Tobacco Use   Smoking status: Never   Smokeless tobacco: Never  Vaping Use   Vaping Use: Never used  Substance and Sexual Activity   Alcohol use: Never   Drug use: Never   Sexual activity: Not on file  Other Topics Concern   Not on file  Social History Narrative   Patient is married.  Has 2 adult children.   Social Determinants of Health   Financial Resource Strain: Low Risk    Difficulty of Paying Living Expenses: Not hard at all  Food Insecurity: Not on file  Transportation Needs: No Transportation Needs   Lack of Transportation (Medical): No   Lack of Transportation (Non-Medical): No  Physical Activity: Not on file  Stress: Not on file  Social Connections: Not on file    Review of Systems  Constitutional:  Negative for chills, fatigue and fever.  HENT:  Positive for congestion and rhinorrhea. Negative for ear pain and sore throat.   Respiratory:  Negative for cough and shortness of breath.   Cardiovascular:  Negative for chest pain.  Gastrointestinal:  Negative for abdominal pain, constipation, diarrhea, nausea and vomiting.  Endocrine: Negative for polydipsia, polyphagia and polyuria.  Genitourinary:  Negative for dysuria and urgency.  Musculoskeletal:  Positive for arthralgias, back pain and myalgias.  Skin:  Positive for rash.       (Symptoms started 3 weeks ago after pedicure)   Neurological:  Negative for dizziness and headaches.  Psychiatric/Behavioral:  Negative for dysphoric mood. The patient is not nervous/anxious.     Objective:  BP 110/76    Pulse 72    Temp 98.6 F (37 C)    Resp 16    Ht 5\' 6"  (1.676 m)    Wt 214 lb (97.1 kg)    BMI 34.54 kg/m   BP/Weight 02/05/2021 12/26/2020  11/13/2020  Systolic BP 110 110 104  Diastolic BP 76 70 62  Wt. (Lbs) 214 217 216  BMI 34.54 35.02 34.86    Physical Exam Vitals reviewed.  Constitutional:      Appearance: Normal appearance. She is normal weight.  HENT:     Right Ear: Tympanic membrane normal.     Left Ear: Tympanic membrane normal.     Nose: Congestion present.     Comments: Sinus tenderness  Mouth/Throat:     Pharynx: No oropharyngeal exudate or posterior oropharyngeal erythema.  Neck:     Vascular: No carotid bruit.  Cardiovascular:     Rate and Rhythm: Normal rate and regular rhythm.     Heart sounds: Normal heart sounds.  Pulmonary:     Effort: Pulmonary effort is normal. No respiratory distress.     Breath sounds: Normal breath sounds.  Abdominal:     General: Abdomen is flat. Bowel sounds are normal.     Palpations: Abdomen is soft.     Tenderness: There is no abdominal tenderness.  Skin:    Findings: Rash present.  Neurological:     Mental Status: She is alert and oriented to person, place, and time.  Psychiatric:        Mood and Affect: Mood normal.        Behavior: Behavior normal.    Diabetic Foot Exam - Simple   Simple Foot Form  03/08/2021  5:18 PM  Visual Inspection See comments: Yes Sensation Testing Intact to touch and monofilament testing bilaterally: Yes Pulse Check Posterior Tibialis and Dorsalis pulse intact bilaterally: Yes Comments Erythematous eczematous like rash concerning for atopic dermatitis.  Onychomycosis.      Lab Results  Component Value Date   WBC 8.0 02/05/2021   HGB 13.7 02/05/2021   HCT 41.5 02/05/2021   PLT 295 02/05/2021   GLUCOSE 103 (H) 02/05/2021   CHOL 129 02/05/2021   TRIG 105 02/05/2021   HDL 56 02/05/2021   LDLCALC 54 02/05/2021   ALT 24 02/05/2021   AST 20 02/05/2021   NA 139 02/05/2021   K 4.9 02/05/2021   CL 97 02/05/2021   CREATININE 1.01 (H) 02/05/2021   BUN 22 02/05/2021   CO2 30 (H) 02/05/2021   TSH 0.948 02/21/2020    HGBA1C 6.3 (H) 02/05/2021      Assessment & Plan:   Problem List Items Addressed This Visit       Cardiovascular and Mediastinum   Hypertension associated with type 2 diabetes mellitus ([place]) - Primary    Well controlled.  No changes to medicines.  Continue to work on eating a healthy diet and exercise.  Labs drawn today.        Relevant Orders   Comprehensive metabolic panel (Completed)   CBC with Differential/Platelet (Completed)     Respiratory   Acute infection of nasal sinus    Rx: augmentin      Relevant Medications   amoxicillin-clavulanate (AUGMENTIN) 875-125 MG tablet   terbinafine (LAMISIL) 250 MG tablet     Digestive   Gastroesophageal reflux disease without esophagitis    The current medical regimen is effective;  continue present plan and medications.         Endocrine   Combined hyperlipidemia associated with type 2 diabetes mellitus (HCC)    Control: good Recommend check feet daily. Recommend annual eye exams. Medicines: Continue ozempic 1 gm daily. Continue crestor 5 mg before bed.  Continue to work on eating a healthy diet and exercise.  Labs drawn today.         Type 2 diabetes mellitus with hyperglycemia, without long-term current use of insulin (HCC)           Relevant Orders   Hemoglobin A1c (Completed)   Microalbumin / creatinine urine ratio (Completed)     Musculoskeletal and Integument   Onychomycosis    Start lamisil for toe nail fungus.        Relevant Medications  terbinafine (LAMISIL) 250 MG tablet   Rash    Start triamcinalone ointment twice a day, mix with eucerin or cetaphil or aveeno.         Other   Hyperlipidemia    Well controlled.  No changes to medicines.  Continue to work on eating a healthy diet and exercise.  Labs drawn today.        Relevant Orders   Lipid panel (Completed)   Class 1 obesity due to excess calories with serious comorbidity and body mass index (BMI) of 34.0 to 34.9 in adult     Recommend continue to work on eating healthy diet and exercise. Continue ozempic.       GAD (generalized anxiety disorder)    The current medical regimen is effective;  continue present plan and medications.     .  Meds ordered this encounter  Medications   triamcinolone ointment (KENALOG) 0.1 %    Sig: Apply 1 application topically 2 (two) times daily.    Dispense:  80 g    Refill:  0   amoxicillin-clavulanate (AUGMENTIN) 875-125 MG tablet    Sig: Take 1 tablet by mouth 2 (two) times daily.    Dispense:  20 tablet    Refill:  0   terbinafine (LAMISIL) 250 MG tablet    Sig: Take 1 tablet (250 mg total) by mouth daily.    Dispense:  90 tablet    Refill:  0    Orders Placed This Encounter  Procedures   Comprehensive metabolic panel   Hemoglobin A1c   Lipid panel   CBC with Differential/Platelet   Microalbumin / creatinine urine ratio     Follow-up: Return in about 3 months (around 05/06/2021) for chronic fasting.  An After Visit Summary was printed and given to the patient.  Rochel Brome, MD [facility] [phone]

## 2021-02-05 ENCOUNTER — Other Ambulatory Visit: Payer: Self-pay

## 2021-02-05 ENCOUNTER — Encounter: Payer: Self-pay | Admitting: Family Medicine

## 2021-02-05 ENCOUNTER — Ambulatory Visit (INDEPENDENT_AMBULATORY_CARE_PROVIDER_SITE_OTHER): Payer: Medicare HMO | Admitting: Family Medicine

## 2021-02-05 VITALS — BP 110/76 | HR 72 | Temp 98.6°F | Resp 16 | Ht 66.0 in | Wt 214.0 lb

## 2021-02-05 DIAGNOSIS — R69 Illness, unspecified: Secondary | ICD-10-CM | POA: Diagnosis not present

## 2021-02-05 DIAGNOSIS — J018 Other acute sinusitis: Secondary | ICD-10-CM

## 2021-02-05 DIAGNOSIS — E1169 Type 2 diabetes mellitus with other specified complication: Secondary | ICD-10-CM | POA: Diagnosis not present

## 2021-02-05 DIAGNOSIS — E782 Mixed hyperlipidemia: Secondary | ICD-10-CM | POA: Diagnosis not present

## 2021-02-05 DIAGNOSIS — F411 Generalized anxiety disorder: Secondary | ICD-10-CM

## 2021-02-05 DIAGNOSIS — I152 Hypertension secondary to endocrine disorders: Secondary | ICD-10-CM

## 2021-02-05 DIAGNOSIS — B351 Tinea unguium: Secondary | ICD-10-CM

## 2021-02-05 DIAGNOSIS — K219 Gastro-esophageal reflux disease without esophagitis: Secondary | ICD-10-CM

## 2021-02-05 DIAGNOSIS — E1165 Type 2 diabetes mellitus with hyperglycemia: Secondary | ICD-10-CM | POA: Diagnosis not present

## 2021-02-05 DIAGNOSIS — Z6834 Body mass index (BMI) 34.0-34.9, adult: Secondary | ICD-10-CM

## 2021-02-05 DIAGNOSIS — E6609 Other obesity due to excess calories: Secondary | ICD-10-CM | POA: Diagnosis not present

## 2021-02-05 DIAGNOSIS — E1159 Type 2 diabetes mellitus with other circulatory complications: Secondary | ICD-10-CM

## 2021-02-05 DIAGNOSIS — R21 Rash and other nonspecific skin eruption: Secondary | ICD-10-CM | POA: Diagnosis not present

## 2021-02-05 MED ORDER — AMOXICILLIN-POT CLAVULANATE 875-125 MG PO TABS: 1.0000 | ORAL_TABLET | Freq: Two times a day (BID) | ORAL | 0 refills | Status: DC

## 2021-02-05 MED ORDER — TERBINAFINE HCL 250 MG PO TABS: 250.0000 mg | ORAL_TABLET | Freq: Every day | ORAL | 0 refills | Status: DC

## 2021-02-05 MED ORDER — TRIAMCINOLONE ACETONIDE 0.1 % EX OINT: 1.0000 "application " | TOPICAL_OINTMENT | Freq: Two times a day (BID) | CUTANEOUS | 0 refills | Status: DC

## 2021-02-05 NOTE — Patient Instructions (Signed)
Start augmentin for sinusitis. Start lamisil for toe nail fungus.  Start triamcinalone ointment twice a day, mix with eucerin or cetaphil or aveeno.

## 2021-02-06 ENCOUNTER — Telehealth: Payer: Self-pay

## 2021-02-06 LAB — CBC WITH DIFFERENTIAL/PLATELET
Basophils Absolute: 0.1 10*3/uL (ref 0.0–0.2)
Basos: 1 %
EOS (ABSOLUTE): 0.3 10*3/uL (ref 0.0–0.4)
Eos: 3 %
Hematocrit: 41.5 % (ref 34.0–46.6)
Hemoglobin: 13.7 g/dL (ref 11.1–15.9)
Immature Grans (Abs): 0 10*3/uL (ref 0.0–0.1)
Immature Granulocytes: 0 %
Lymphocytes Absolute: 2 10*3/uL (ref 0.7–3.1)
Lymphs: 25 %
MCH: 28.5 pg (ref 26.6–33.0)
MCHC: 33 g/dL (ref 31.5–35.7)
MCV: 87 fL (ref 79–97)
Monocytes Absolute: 0.7 10*3/uL (ref 0.1–0.9)
Monocytes: 9 %
Neutrophils Absolute: 5 10*3/uL (ref 1.4–7.0)
Neutrophils: 62 %
Platelets: 295 10*3/uL (ref 150–450)
RBC: 4.8 x10E6/uL (ref 3.77–5.28)
RDW: 13.1 % (ref 11.7–15.4)
WBC: 8 10*3/uL (ref 3.4–10.8)

## 2021-02-06 LAB — COMPREHENSIVE METABOLIC PANEL
ALT: 24 IU/L (ref 0–32)
AST: 20 IU/L (ref 0–40)
Albumin/Globulin Ratio: 1.7 (ref 1.2–2.2)
Albumin: 4.3 g/dL (ref 3.8–4.8)
Alkaline Phosphatase: 95 IU/L (ref 44–121)
BUN/Creatinine Ratio: 22 (ref 12–28)
BUN: 22 mg/dL (ref 8–27)
Bilirubin Total: 0.4 mg/dL (ref 0.0–1.2)
CO2: 30 mmol/L — ABNORMAL HIGH (ref 20–29)
Calcium: 10.5 mg/dL — ABNORMAL HIGH (ref 8.7–10.3)
Chloride: 97 mmol/L (ref 96–106)
Creatinine, Ser: 1.01 mg/dL — ABNORMAL HIGH (ref 0.57–1.00)
Globulin, Total: 2.5 g/dL (ref 1.5–4.5)
Glucose: 103 mg/dL — ABNORMAL HIGH (ref 70–99)
Potassium: 4.9 mmol/L (ref 3.5–5.2)
Sodium: 139 mmol/L (ref 134–144)
Total Protein: 6.8 g/dL (ref 6.0–8.5)
eGFR: 61 mL/min/{1.73_m2} (ref >59)

## 2021-02-06 LAB — HEMOGLOBIN A1C
Est. average glucose Bld gHb Est-mCnc: 134 mg/dL
Hgb A1c MFr Bld: 6.3 % — ABNORMAL HIGH (ref 4.8–5.6)

## 2021-02-06 LAB — LIPID PANEL
Chol/HDL Ratio: 2.3 ratio (ref 0.0–4.4)
Cholesterol, Total: 129 mg/dL (ref 100–199)
HDL: 56 mg/dL (ref >39)
LDL Chol Calc (NIH): 54 mg/dL (ref 0–99)
Triglycerides: 105 mg/dL (ref 0–149)
VLDL Cholesterol Cal: 19 mg/dL (ref 5–40)

## 2021-02-06 LAB — MICROALBUMIN / CREATININE URINE RATIO
Creatinine, Urine: 27.1 mg/dL
Microalb/Creat Ratio: <11 mg/g creat (ref 0–29)
Microalbumin, Urine: <3 ug/mL

## 2021-02-06 NOTE — Chronic Care Management (AMB) (Signed)
02/06/2021- Checked Covermymeds regarding Potassium prior authorization. Medication denied. The patient currently has access to the requested medication and a Prior Authorization is not needed for the patient/medication.  Billee Cashing, CMA Clinical Pharmacist Assistant [phone]

## 2021-02-12 ENCOUNTER — Ambulatory Visit: Payer: Medicare HMO

## 2021-02-14 DIAGNOSIS — E119 Type 2 diabetes mellitus without complications: Secondary | ICD-10-CM | POA: Insufficient documentation

## 2021-02-14 DIAGNOSIS — B351 Tinea unguium: Secondary | ICD-10-CM | POA: Insufficient documentation

## 2021-02-14 DIAGNOSIS — E1165 Type 2 diabetes mellitus with hyperglycemia: Secondary | ICD-10-CM | POA: Insufficient documentation

## 2021-02-14 DIAGNOSIS — F411 Generalized anxiety disorder: Secondary | ICD-10-CM | POA: Insufficient documentation

## 2021-02-14 DIAGNOSIS — J019 Acute sinusitis, unspecified: Secondary | ICD-10-CM | POA: Insufficient documentation

## 2021-02-14 DIAGNOSIS — E1169 Type 2 diabetes mellitus with other specified complication: Secondary | ICD-10-CM | POA: Insufficient documentation

## 2021-02-14 DIAGNOSIS — R21 Rash and other nonspecific skin eruption: Secondary | ICD-10-CM | POA: Insufficient documentation

## 2021-02-14 NOTE — Assessment & Plan Note (Signed)
The current medical regimen is effective;  continue present plan and medications.  

## 2021-02-14 NOTE — Assessment & Plan Note (Signed)
Well controlled.  ?No changes to medicines.  ?Continue to work on eating a healthy diet and exercise.  ?Labs drawn today.  ?

## 2021-02-14 NOTE — Assessment & Plan Note (Signed)
Start triamcinalone ointment twice a day, mix with eucerin or cetaphil or aveeno.

## 2021-02-14 NOTE — Assessment & Plan Note (Signed)
Start lamisil for toenail fungus 

## 2021-02-14 NOTE — Assessment & Plan Note (Addendum)
Control: good Recommend check feet daily. Recommend annual eye exams. Medicines: Continue ozempic 1 gm daily. Continue crestor 5 mg before bed.  Continue to work on eating a healthy diet and exercise.  Labs drawn today.

## 2021-02-14 NOTE — Assessment & Plan Note (Signed)
Rx augmentin.  

## 2021-02-14 NOTE — Assessment & Plan Note (Signed)
Recommend continue to work on eating healthy diet and exercise. Continue ozempic. 

## 2021-02-19 ENCOUNTER — Other Ambulatory Visit: Payer: Self-pay

## 2021-02-19 DIAGNOSIS — F411 Generalized anxiety disorder: Secondary | ICD-10-CM

## 2021-02-19 MED ORDER — OZEMPIC (1 MG/DOSE) 4 MG/3ML ~~LOC~~ SOPN: 1.0000 mg | PEN_INJECTOR | SUBCUTANEOUS | 1 refills | Status: DC

## 2021-02-19 MED ORDER — CLONAZEPAM 0.5 MG PO TABS: 0.5000 mg | ORAL_TABLET | Freq: Every day | ORAL | 2 refills | Status: DC | PRN

## 2021-03-07 ENCOUNTER — Encounter: Payer: Self-pay | Admitting: Physician Assistant

## 2021-03-07 ENCOUNTER — Telehealth (INDEPENDENT_AMBULATORY_CARE_PROVIDER_SITE_OTHER): Payer: Medicare HMO | Admitting: Physician Assistant

## 2021-03-07 DIAGNOSIS — U071 COVID-19: Secondary | ICD-10-CM | POA: Diagnosis not present

## 2021-03-07 LAB — POC COVID19 BINAXNOW: SARS Coronavirus 2 Ag: POSITIVE — AB

## 2021-03-07 MED ORDER — MOLNUPIRAVIR EUA 200MG CAPSULE: 4.0000 | ORAL_CAPSULE | Freq: Two times a day (BID) | ORAL | 0 refills | Status: AC

## 2021-03-07 NOTE — Progress Notes (Signed)
Virtual Visit via Telephone Note   This visit type was conducted due to national recommendations for restrictions regarding the COVID-19 Pandemic (e.g. social distancing) in an effort to limit this patient's exposure and mitigate transmission in our community.  Due to her co-morbid illnesses, this patient is at least at moderate risk for complications without adequate follow up.  This format is felt to be most appropriate for this patient at this time.  The patient did not have access to video technology/had technical difficulties with video requiring transitioning to audio format only (telephone).  All issues noted in this document were discussed and addressed.  No physical exam could be performed with this format.  Patient verbally consented to a telehealth visit.   Date:  03/07/2021   ID:  Anna Greene, DOB 1955-09-11, MRN [id number]  Patient Location: Home Provider Location: Office  PCP:  Blane Ohara, MD    Chief Complaint:  congestion and sore throat  History of Present Illness:    Anna Greene is a 66 y.o. female with complaints of sinus pressure , headache, and scratchy throat since Saturday.  She denies cough.  Not having any nasal drainage.  Denies body aches or fatigue  The patient does have symptoms concerning for COVID-19 infection (fever, chills, cough, or new shortness of breath).    Past Medical History:  Diagnosis Date   Benign fasciculation-cramp syndrome    Depression    Diabetes mellitus without complication (HCC)    GERD (gastroesophageal reflux disease)    Hyperlipidemia    Hypertension    Nephrolithiasis    Osteoarthritis    Bilateral knees, degenerative disc disease.  Patient has had epidural steroid injections x2   Plantar fasciitis    Past Surgical History:  Procedure Laterality Date   BREAST REDUCTION SURGERY Bilateral 1996   CESAREAN SECTION  1976   CESAREAN SECTION  1979   KNEE ARTHROSCOPY W/ MENISCAL REPAIR Left 1995   Per patient muscle was  repaired   KNEE ARTHROSCOPY W/ MENISCAL REPAIR Right 2003   knee surgery Left 10/2020   meniscal repair via arthroscopy   PANNICULECTOMY     REDUCTION MAMMAPLASTY  1996   REPLACEMENT TOTAL KNEE Right 2019   TONSILLECTOMY  1960     Current Meds  Medication Sig   atenolol-chlorthalidone (TENORETIC) 100-25 MG tablet Take 1 tablet by mouth daily.   celecoxib (CELEBREX) 200 MG capsule Take 1 capsule (200 mg total) by mouth 2 (two) times daily.   cetirizine (ZYRTEC) 10 MG tablet Take 10 mg by mouth daily.   clonazePAM (KLONOPIN) 0.5 MG tablet Take 1 tablet (0.5 mg total) by mouth daily as needed.   gabapentin (NEURONTIN) 300 MG capsule Take 1 capsule (300 mg total) by mouth 2 (two) times daily.   losartan (COZAAR) 50 MG tablet Take 1 tablet (50 mg total) by mouth daily.   molnupiravir EUA (LAGEVRIO) 200 mg CAPS capsule Take 4 capsules (800 mg total) by mouth 2 (two) times daily for 5 days.   omeprazole (PRILOSEC) 20 MG capsule Take 1 capsule (20 mg total) by mouth daily.   rosuvastatin (CRESTOR) 5 MG tablet Take 1 tablet (5 mg total) by mouth daily.   Semaglutide, 1 MG/DOSE, (OZEMPIC, 1 MG/DOSE,) 4 MG/3ML SOPN Inject 1 mg into the skin once a week.   terbinafine (LAMISIL) 250 MG tablet Take 1 tablet (250 mg total) by mouth daily.   triamcinolone ointment (KENALOG) 0.1 % Apply 1 application topically 2 (two) times daily.  Allergies:   Metformin and related; Ramipril; and Tuberculin, ppd   Social History   Tobacco Use   Smoking status: Never   Smokeless tobacco: Never  Vaping Use   Vaping Use: Never used  Substance Use Topics   Alcohol use: Never   Drug use: Never     Family Hx: The patient's family history includes Anxiety disorder in her sister; Arthritis in her sister; Cancer in her father; Depression in her sister; Diabetes in her sister, sister, and sister; Heart disease in her mother; Stroke in her brother and mother.  ROS:   Please see the history of present illness.     All other systems reviewed and are negative.  Labs/Other Tests and Data Reviewed:    Recent Labs: 02/05/2021: ALT 24; BUN 22; Creatinine, Ser 1.01; Hemoglobin 13.7; Platelets 295; Potassium 4.9; Sodium 139   Recent Lipid Panel Lab Results  Component Value Date/Time   CHOL 129 02/05/2021 09:31 AM   TRIG 105 02/05/2021 09:31 AM   HDL 56 02/05/2021 09:31 AM   CHOLHDL 2.3 02/05/2021 09:31 AM   LDLCALC 54 02/05/2021 09:31 AM    Wt Readings from Last 3 Encounters:  02/05/21 214 lb (97.1 kg)  12/26/20 217 lb (98.4 kg)  11/13/20 216 lb (98 kg)     Objective:    Vital Signs:  There were no vitals taken for this visit.   VITAL SIGNS:  reviewed GEN:  no acute distress  Video Visit on 03/07/2021  Component Date Value Ref Range Status   SARS Coronavirus 2 Ag 03/07/2021 Positive (A)  Negative Final     ASSESSMENT & PLAN:    COVID 19 - rx for molnupiravir (hold crestor) and may use tylenol as needed --- recommend to quarantine according to CDC guidelines and follow up if symptoms persist/worsen  COVID-19 Education: The signs and symptoms of COVID-19 were discussed with the patient and how to seek care for testing (follow up with PCP or arrange E-visit). The importance of social distancing was discussed today.  Time:   Today, I have spent 10 minutes with the patient with telehealth technology discussing the above problems.     Medication Adjustments/Labs and Tests Ordered: Current medicines are reviewed at length with the patient today.  Concerns regarding medicines are outlined above.   Tests Ordered: Orders Placed This Encounter  Procedures   POC COVID-19 BinaxNow    Medication Changes: Meds ordered this encounter  Medications   molnupiravir EUA (LAGEVRIO) 200 mg CAPS capsule    Sig: Take 4 capsules (800 mg total) by mouth 2 (two) times daily for 5 days.    Dispense:  40 capsule    Refill:  0    Order Specific Question:   Supervising Provider    AnswerCorey Harold    Follow Up:  In Person prn  Signed, Jettie Pagan  03/07/2021 4:15 PM    [facility]

## 2021-03-12 ENCOUNTER — Telehealth: Payer: Self-pay

## 2021-03-12 ENCOUNTER — Other Ambulatory Visit: Payer: Self-pay

## 2021-03-12 MED ORDER — CETIRIZINE HCL 10 MG PO TABS: 10.0000 mg | ORAL_TABLET | Freq: Every day | ORAL | 3 refills | Status: DC

## 2021-03-12 MED ORDER — TERBINAFINE HCL 250 MG PO TABS: 250.0000 mg | ORAL_TABLET | Freq: Every day | ORAL | 0 refills | Status: DC

## 2021-03-12 NOTE — Chronic Care Management (AMB) (Signed)
° ° °  Chronic Care Management Pharmacy Assistant   Name: Anna Greene  MRN: [id number] DOB: June 29, 1955   Reason for Encounter: Medication Coordination for Upstream    Recent office visits:  03/07/21 Marge Duncans PA-C. Video Visit. Seen for Covid. Started on Molnupiravir 800mg  2 times daily.   02/05/21 Rochel Brome MD. Seen for DM, HLD and HTN. Started on Augmentin 875-125mg , Terbinafine HCI 250mg  and Triamcinolone Acetonide 0.1%.   01/12/21 Rochel Brome MD. Orders Only. Increased Gabapentin from 100mg  to 300mg .  Recent consult visits:  None  Hospital visits:  None  Medications: Outpatient Encounter Medications as of 03/12/2021  Medication Sig   atenolol-chlorthalidone (TENORETIC) 100-25 MG tablet Take 1 tablet by mouth daily.   celecoxib (CELEBREX) 200 MG capsule Take 1 capsule (200 mg total) by mouth 2 (two) times daily.   cetirizine (ZYRTEC) 10 MG tablet Take 10 mg by mouth daily.   clonazePAM (KLONOPIN) 0.5 MG tablet Take 1 tablet (0.5 mg total) by mouth daily as needed.   gabapentin (NEURONTIN) 300 MG capsule Take 1 capsule (300 mg total) by mouth 2 (two) times daily.   losartan (COZAAR) 50 MG tablet Take 1 tablet (50 mg total) by mouth daily.   molnupiravir EUA (LAGEVRIO) 200 mg CAPS capsule Take 4 capsules (800 mg total) by mouth 2 (two) times daily for 5 days.   omeprazole (PRILOSEC) 20 MG capsule Take 1 capsule (20 mg total) by mouth daily.   potassium chloride (KLOR-CON) 10 MEQ tablet Take 1 tablet (10 mEq total) by mouth once for 1 dose.   rosuvastatin (CRESTOR) 5 MG tablet Take 1 tablet (5 mg total) by mouth daily.   Semaglutide, 1 MG/DOSE, (OZEMPIC, 1 MG/DOSE,) 4 MG/3ML SOPN Inject 1 mg into the skin once a week.   terbinafine (LAMISIL) 250 MG tablet Take 1 tablet (250 mg total) by mouth daily.   triamcinolone ointment (KENALOG) 0.1 % Apply 1 application topically 2 (two) times daily.   No facility-administered encounter medications on file as of 03/12/2021.    Reviewed  chart for medication changes ahead of medication coordination call.  No Consults, or hospital visits since last care coordination call/Pharmacist visit.   BP Readings from Last 3 Encounters:  02/05/21 110/76  12/26/20 110/70  11/13/20 104/62    Lab Results  Component Value Date   HGBA1C 6.3 (H) 02/05/2021     Patient obtains medications through Adherence Packaging  30 Days   Patient is due for first adherence delivery on: 03/22/21. Called patient and reviewed medications and coordinated delivery.  This delivery to include: Potassium Chloride Er 69meq 1 at Breakfast Losartan 50mg  1 at breakfast Atenolol-Chlorthalidone 100-25mg  1 at breakfast Celecoxib 200mg  1 at breakfast and 1 at evening meal Gabapentin 300mg  1 at breakfast and 1 at evening meal Omeprazole 20mg  1 at breakfast Clonazepam 0.5mg  1 tab once daily prn (Bottles)  Rosuvastatin 5mg  1 at evening meal Cetirizine 10mg  1 at evening meal Calcium/Vitamin D3 35mcg 1 at breakfast and 1 at evening meal Terbinafine HCI 250mg  1 at breakfast  Ozempic 1mg  Inject 1mg  into skin once a week   Patient declined the following medications None  Patient needs refills - Request Sent  Cetirizine 10mg  Calcium/Vitamin D3 76mcg Terbinafine HCI 250mg   Confirmed delivery date of 03/22/21, advised patient that pharmacy will contact them the morning of delivery.   Elray Mcgregor, [place] Pharmacist Assistant  [phone]

## 2021-03-14 NOTE — Telephone Encounter (Signed)
Compliant on meds 

## 2021-03-17 ENCOUNTER — Other Ambulatory Visit: Payer: Self-pay | Admitting: Family Medicine

## 2021-03-19 ENCOUNTER — Telehealth: Payer: Self-pay

## 2021-03-19 NOTE — Telephone Encounter (Signed)
Anna Greene came in this afternoon stating that she is due for her mammogram after 04/10/2021. The patient is requesting an order for a mammogram. Patient stated she will schedule the appointment herself, just needs the order. Patient was notified that we will call her once the order is ready for pickup. ?

## 2021-03-28 ENCOUNTER — Other Ambulatory Visit: Payer: Self-pay | Admitting: Family Medicine

## 2021-03-28 DIAGNOSIS — Z1231 Encounter for screening mammogram for malignant neoplasm of breast: Secondary | ICD-10-CM

## 2021-04-02 DIAGNOSIS — H11001 Unspecified pterygium of right eye: Secondary | ICD-10-CM | POA: Diagnosis not present

## 2021-04-02 DIAGNOSIS — H18452 Nodular corneal degeneration, left eye: Secondary | ICD-10-CM | POA: Diagnosis not present

## 2021-04-04 ENCOUNTER — Other Ambulatory Visit: Payer: Self-pay | Admitting: Family Medicine

## 2021-04-04 DIAGNOSIS — Z1231 Encounter for screening mammogram for malignant neoplasm of breast: Secondary | ICD-10-CM

## 2021-04-11 ENCOUNTER — Ambulatory Visit
Admission: RE | Admit: 2021-04-11 | Discharge: 2021-04-11 | Disposition: A | Discharge status: Home / Self Care | Payer: Medicare HMO | Source: Ambulatory Visit | Attending: Family Medicine | Admitting: Family Medicine

## 2021-04-11 ENCOUNTER — Telehealth: Payer: Self-pay

## 2021-04-11 DIAGNOSIS — Z1231 Encounter for screening mammogram for malignant neoplasm of breast: Secondary | ICD-10-CM

## 2021-04-11 NOTE — Chronic Care Management (AMB) (Signed)
? ? ?Chronic Care Management ?Pharmacy Assistant  ? ?Name: Anna Greene  MRN: [id number] DOB: 05-07-1955 ? ?Reason for Encounter: Medication Review/ Medication coordination ? ?Recent office visits:  ?None ? ?Recent consult visits:  ?None ? ?Hospital visits:  ?None in previous 6 months ? ?Medications: ?Outpatient Encounter Medications as of 04/11/2021  ?Medication Sig  ? atenolol-chlorthalidone (TENORETIC) 100-25 MG tablet Take 1 tablet by mouth daily.  ? celecoxib (CELEBREX) 200 MG capsule Take 1 capsule (200 mg total) by mouth 2 (two) times daily.  ? cetirizine (ZYRTEC) 10 MG tablet Take 1 tablet (10 mg total) by mouth daily.  ? clonazePAM (KLONOPIN) 0.5 MG tablet Take 1 tablet (0.5 mg total) by mouth daily as needed.  ? gabapentin (NEURONTIN) 300 MG capsule TAKE ONE CAPSULE BY MOUTH TWICE DAILY  ? losartan (COZAAR) 50 MG tablet Take 1 tablet (50 mg total) by mouth daily.  ? omeprazole (PRILOSEC) 20 MG capsule Take 1 capsule (20 mg total) by mouth daily.  ? potassium chloride (KLOR-CON) 10 MEQ tablet Take 1 tablet (10 mEq total) by mouth once for 1 dose.  ? rosuvastatin (CRESTOR) 5 MG tablet Take 1 tablet (5 mg total) by mouth daily.  ? Semaglutide, 1 MG/DOSE, (OZEMPIC, 1 MG/DOSE,) 4 MG/3ML SOPN Inject 1 mg into the skin once a week.  ? terbinafine (LAMISIL) 250 MG tablet Take 1 tablet (250 mg total) by mouth daily.  ? triamcinolone ointment (KENALOG) 0.1 % Apply 1 application topically 2 (two) times daily.  ? ?No facility-administered encounter medications on file as of 04/11/2021.  ? ?Reviewed chart for medication changes ahead of medication coordination call. ? ?No OVs, Consults, or hospital visits since last care coordination call/Pharmacist visit.  ? ?No medication changes indicated OR if recent visit, treatment plan here. ? ?BP Readings from Last 3 Encounters:  ?02/05/21 110/76  ?12/26/20 110/70  ?11/13/20 104/62  ?  ?Lab Results  ?Component Value Date  ? HGBA1C 6.3 (H) 02/05/2021  ?  ? ?Patient obtains medications  through Adherence Packaging  30 Days  ? ?Last adherence delivery included:  ?Potassium Chloride Er 64meq 1 at Breakfast ?Losartan 50mg  1 at breakfast ?Atenolol-Chlorthalidone 100-25mg  1 at breakfast ?Celecoxib 200mg  1 at breakfast and 1 at evening meal ?Gabapentin 300mg  1 at breakfast and 1 at evening meal ?Omeprazole 20mg  1 at breakfast ?Clonazepam 0.5mg  1 tab once daily prn (Bottles)  ?Rosuvastatin 5mg  1 at evening meal ?Cetirizine 10mg  1 at evening meal ?Calcium/Vitamin D3 51mcg 1 at breakfast and 1 at evening meal ?Terbinafine HCI 250mg  1 at breakfast  ?Ozempic 1mg  Inject 1mg  into skin once a week  ? ?Patient declined (meds) last month: ?None ? ?Patient is due for next adherence delivery on: 04-20-2021 ? ?Called patient and reviewed medications and coordinated delivery. ? ?This delivery to include: ?Potassium Chloride Er 33meq 1 at Breakfast ?Losartan 50mg  1 at breakfast ?Atenolol-Chlorthalidone 100-25mg  1 at breakfast ?Celecoxib 200mg  1 at breakfast and 1 at evening meal ?Gabapentin 300mg  1 at breakfast and 1 at evening meal ?Omeprazole 20mg  1 at breakfast ?Clonazepam 0.5mg  1 tab once daily prn (Bottles)  ?Rosuvastatin 5mg  1 at evening meal ?Cetirizine 10mg  1 at evening meal ?Calcium/Vitamin D3 23mcg 1 at breakfast and 1 at evening meal ?Terbinafine HCI 250mg  1 at breakfast  ?Ozempic 1mg  Inject 1mg  into skin once a week  ? ?No acute or short fills needed. ? ?Patient declined the following medications: ?None ? ?Patient needs refills for: ?None ? ?Confirmed delivery date of 04-20-2021 advised patient that pharmacy  will contact them the morning of delivery. ? ?NOTES: ?Patient requested medication delivery date of 04-20-2021 instead of 04-23-2021 and on the 2nd route. ? ?Care Gaps: ?Last annual wellness visit? 05-01-2020 ?Shingrix overdue ?Hep C screening overdue ? ?Star Rating Drugs: ?Ozempic 1 mg- Last filled 03-19-2021 28 DS ?Rosuvastatin 5 mg- Last filled 03-19-2021 30 DS ?Losartan 50 mg- Last filled 03-19-2021  30 DS ? ?Malecca Hicks CMA ?Clinical Pharmacist Assistant ?[phone] ? ?

## 2021-04-17 ENCOUNTER — Telehealth: Payer: Self-pay

## 2021-04-17 NOTE — Chronic Care Management (AMB) (Signed)
? ? ?  Chronic Care Management ?Pharmacy Assistant  ? ?Name: Anna Greene  MRN: [id number] DOB: 09/19/1955 ? ?Reason for Encounter: Prior Authorization Coordination  ? ?04/17/2021- Prior authorization created for Terbinafine 250 mg through Covermymeds. Awaiting determination. Medication was approved. PA Case ID: [id number] - Rx #: [id number]. This approval authorizes your coverage from 01/14/2021 - 01/13/2022. ? ?Medications: ?Outpatient Encounter Medications as of 04/17/2021  ?Medication Sig  ? atenolol-chlorthalidone (TENORETIC) 100-25 MG tablet Take 1 tablet by mouth daily.  ? celecoxib (CELEBREX) 200 MG capsule Take 1 capsule (200 mg total) by mouth 2 (two) times daily.  ? cetirizine (ZYRTEC) 10 MG tablet Take 1 tablet (10 mg total) by mouth daily.  ? clonazePAM (KLONOPIN) 0.5 MG tablet Take 1 tablet (0.5 mg total) by mouth daily as needed.  ? gabapentin (NEURONTIN) 300 MG capsule TAKE ONE CAPSULE BY MOUTH TWICE DAILY  ? losartan (COZAAR) 50 MG tablet Take 1 tablet (50 mg total) by mouth daily.  ? omeprazole (PRILOSEC) 20 MG capsule Take 1 capsule (20 mg total) by mouth daily.  ? potassium chloride (KLOR-CON) 10 MEQ tablet Take 1 tablet (10 mEq total) by mouth once for 1 dose.  ? rosuvastatin (CRESTOR) 5 MG tablet Take 1 tablet (5 mg total) by mouth daily.  ? Semaglutide, 1 MG/DOSE, (OZEMPIC, 1 MG/DOSE,) 4 MG/3ML SOPN Inject 1 mg into the skin once a week.  ? terbinafine (LAMISIL) 250 MG tablet Take 1 tablet (250 mg total) by mouth daily.  ? triamcinolone ointment (KENALOG) 0.1 % Apply 1 application topically 2 (two) times daily.  ? ?No facility-administered encounter medications on file as of 04/17/2021.  ? ? ?Billee Cashing, CMA ?Clinical Pharmacist Assistant ?[phone] ? ?

## 2021-04-23 ENCOUNTER — Telehealth: Payer: Self-pay

## 2021-04-23 NOTE — Telephone Encounter (Signed)
PRIOR AUTH WAS DONE THROUGH PATIENT'S INSURANCE FOR TERBINAFINE HCL AND WAS APPROVED THROUGH PATIENT'S INSURANCE 01/14/21 TO 01/13/2022. ?

## 2021-04-24 ENCOUNTER — Other Ambulatory Visit: Payer: Self-pay

## 2021-04-24 MED ORDER — TERBINAFINE HCL 250 MG PO TABS: 250.0000 mg | ORAL_TABLET | Freq: Every day | ORAL | 2 refills | Status: DC

## 2021-05-07 ENCOUNTER — Other Ambulatory Visit: Payer: Self-pay

## 2021-05-07 ENCOUNTER — Other Ambulatory Visit: Payer: Medicare HMO

## 2021-05-07 DIAGNOSIS — E1165 Type 2 diabetes mellitus with hyperglycemia: Secondary | ICD-10-CM | POA: Diagnosis not present

## 2021-05-07 DIAGNOSIS — E782 Mixed hyperlipidemia: Secondary | ICD-10-CM | POA: Diagnosis not present

## 2021-05-07 DIAGNOSIS — E1169 Type 2 diabetes mellitus with other specified complication: Secondary | ICD-10-CM | POA: Diagnosis not present

## 2021-05-07 DIAGNOSIS — I152 Hypertension secondary to endocrine disorders: Secondary | ICD-10-CM

## 2021-05-07 DIAGNOSIS — E1159 Type 2 diabetes mellitus with other circulatory complications: Secondary | ICD-10-CM | POA: Diagnosis not present

## 2021-05-08 LAB — COMPREHENSIVE METABOLIC PANEL
ALT: 21 IU/L (ref 0–32)
AST: 23 IU/L (ref 0–40)
Albumin/Globulin Ratio: 1.5 (ref 1.2–2.2)
Albumin: 4.2 g/dL (ref 3.8–4.8)
Alkaline Phosphatase: 88 IU/L (ref 44–121)
BUN/Creatinine Ratio: 13 (ref 12–28)
BUN: 13 mg/dL (ref 8–27)
Bilirubin Total: 0.4 mg/dL (ref 0.0–1.2)
CO2: 29 mmol/L (ref 20–29)
Calcium: 10.1 mg/dL (ref 8.7–10.3)
Chloride: 98 mmol/L (ref 96–106)
Creatinine, Ser: 0.97 mg/dL (ref 0.57–1.00)
Globulin, Total: 2.8 g/dL (ref 1.5–4.5)
Glucose: 99 mg/dL (ref 70–99)
Potassium: 4.5 mmol/L (ref 3.5–5.2)
Sodium: 139 mmol/L (ref 134–144)
Total Protein: 7 g/dL (ref 6.0–8.5)
eGFR: 64 mL/min/{1.73_m2} (ref >59)

## 2021-05-08 LAB — CBC WITH DIFFERENTIAL/PLATELET
Basophils Absolute: 0.1 10*3/uL (ref 0.0–0.2)
Basos: 1 %
EOS (ABSOLUTE): 0.4 10*3/uL (ref 0.0–0.4)
Eos: 5 %
Hematocrit: 42.5 % (ref 34.0–46.6)
Hemoglobin: 13.9 g/dL (ref 11.1–15.9)
Immature Grans (Abs): 0 10*3/uL (ref 0.0–0.1)
Immature Granulocytes: 1 %
Lymphocytes Absolute: 2.5 10*3/uL (ref 0.7–3.1)
Lymphs: 32 %
MCH: 28.9 pg (ref 26.6–33.0)
MCHC: 32.7 g/dL (ref 31.5–35.7)
MCV: 88 fL (ref 79–97)
Monocytes Absolute: 0.7 10*3/uL (ref 0.1–0.9)
Monocytes: 9 %
Neutrophils Absolute: 4.1 10*3/uL (ref 1.4–7.0)
Neutrophils: 52 %
Platelets: 292 10*3/uL (ref 150–450)
RBC: 4.81 x10E6/uL (ref 3.77–5.28)
RDW: 13.1 % (ref 11.7–15.4)
WBC: 7.7 10*3/uL (ref 3.4–10.8)

## 2021-05-08 LAB — HEMOGLOBIN A1C
Est. average glucose Bld gHb Est-mCnc: 123 mg/dL
Hgb A1c MFr Bld: 5.9 % — ABNORMAL HIGH (ref 4.8–5.6)

## 2021-05-08 LAB — LIPID PANEL
Chol/HDL Ratio: 2.8 ratio (ref 0.0–4.4)
Cholesterol, Total: 154 mg/dL (ref 100–199)
HDL: 56 mg/dL (ref >39)
LDL Chol Calc (NIH): 72 mg/dL (ref 0–99)
Triglycerides: 154 mg/dL — ABNORMAL HIGH (ref 0–149)
VLDL Cholesterol Cal: 26 mg/dL (ref 5–40)

## 2021-05-08 LAB — CARDIOVASCULAR RISK ASSESSMENT

## 2021-05-08 NOTE — Progress Notes (Signed)
Blood count normal.  ?Liver function normal.  ?Kidney function normal.  ?Cholesterol: Good.  ?HBA1C:5.9 improved from 6.3. ?Keep appt coming up.

## 2021-05-10 ENCOUNTER — Telehealth: Payer: Self-pay

## 2021-05-10 ENCOUNTER — Other Ambulatory Visit: Payer: Self-pay

## 2021-05-10 DIAGNOSIS — E782 Mixed hyperlipidemia: Secondary | ICD-10-CM

## 2021-05-10 DIAGNOSIS — F411 Generalized anxiety disorder: Secondary | ICD-10-CM

## 2021-05-10 MED ORDER — ROSUVASTATIN CALCIUM 5 MG PO TABS: 5.0000 mg | ORAL_TABLET | Freq: Every day | ORAL | 1 refills | Status: DC

## 2021-05-10 MED ORDER — CLONAZEPAM 0.5 MG PO TABS: 0.5000 mg | ORAL_TABLET | Freq: Every day | ORAL | 2 refills | Status: DC | PRN

## 2021-05-10 NOTE — Progress Notes (Signed)
? ? ?Chronic Care Management ?Pharmacy Assistant  ? ?Name: Anna Greene  MRN: [id number] DOB: 1956-01-04 ? ? ?Reason for Encounter: Medication Coordination for Upstream  ?  ?Recent office visits:  ?None ? ?Recent consult visits:  ?None ? ?Hospital visits:  ?None ? ?Medications: ?Outpatient Encounter Medications as of 05/10/2021  ?Medication Sig  ? atenolol-chlorthalidone (TENORETIC) 100-25 MG tablet Take 1 tablet by mouth daily.  ? celecoxib (CELEBREX) 200 MG capsule Take 1 capsule (200 mg total) by mouth 2 (two) times daily.  ? cetirizine (ZYRTEC) 10 MG tablet Take 1 tablet (10 mg total) by mouth daily.  ? clonazePAM (KLONOPIN) 0.5 MG tablet Take 1 tablet (0.5 mg total) by mouth daily as needed.  ? gabapentin (NEURONTIN) 300 MG capsule TAKE ONE CAPSULE BY MOUTH TWICE DAILY  ? losartan (COZAAR) 50 MG tablet Take 1 tablet (50 mg total) by mouth daily.  ? omeprazole (PRILOSEC) 20 MG capsule Take 1 capsule (20 mg total) by mouth daily.  ? potassium chloride (KLOR-CON) 10 MEQ tablet Take 1 tablet (10 mEq total) by mouth once for 1 dose.  ? rosuvastatin (CRESTOR) 5 MG tablet Take 1 tablet (5 mg total) by mouth daily.  ? Semaglutide, 1 MG/DOSE, (OZEMPIC, 1 MG/DOSE,) 4 MG/3ML SOPN Inject 1 mg into the skin once a week.  ? terbinafine (LAMISIL) 250 MG tablet Take 1 tablet (250 mg total) by mouth daily.  ? triamcinolone ointment (KENALOG) 0.1 % Apply 1 application topically 2 (two) times daily.  ? ?No facility-administered encounter medications on file as of 05/10/2021.  ? ? ?Reviewed chart for medication changes ahead of medication coordination call. ? ?No OVs, Consults, or hospital visits since last care coordination call/Pharmacist visit.  ? ?No medication changes indicated OR if recent visit, treatment plan here. ? ?BP Readings from Last 3 Encounters:  ?02/05/21 110/76  ?12/26/20 110/70  ?11/13/20 104/62  ?  ?Lab Results  ?Component Value Date  ? HGBA1C 5.9 (H) 05/07/2021  ?  ? ?Patient obtains medications through Adherence  Packaging  30 Days  ? ?Last adherence delivery included: ?Potassium Chloride Er 20meq 1 at Breakfast ?Losartan 50mg  1 at breakfast ?Atenolol-Chlorthalidone 100-25mg  1 at breakfast ?Celecoxib 200mg  1 at breakfast and 1 at evening meal ?Gabapentin 300mg  1 at breakfast and 1 at evening meal ?Omeprazole 20mg  1 at breakfast ?Clonazepam 0.5mg  1 tab once daily prn (Bottles)  ?Rosuvastatin 5mg  1 at evening meal ?Cetirizine 10mg  1 at evening meal ?Calcium/Vitamin D3 53mcg 1 at breakfast and 1 at evening meal ?Terbinafine HCI 250mg  1 at breakfast  ?Ozempic 1mg  Inject 1mg  into skin once a week  ? ?Patient declined (meds) last month  ?None ? ?Patient is due for next adherence delivery on: 05/22/21. ?Called patient and reviewed medications and coordinated delivery. ? ?This delivery to include: ?Potassium Chloride Er 28meq 1 at Breakfast ?Losartan 50mg  1 at breakfast ?Atenolol-Chlorthalidone 100-25mg  1 at breakfast ?Celecoxib 200mg  1 at breakfast and 1 at evening meal ?Gabapentin 300mg  1 at breakfast and 1 at evening meal ?Omeprazole 20mg  1 at breakfast ?Clonazepam 0.5mg  1 tab once daily prn (Bottles)  ?Rosuvastatin 5mg  1 at evening meal ?Cetirizine 10mg  1 at evening meal ?Calcium/Vitamin D3 18mcg 1 at breakfast and 1 at evening meal ?Terbinafine HCI 250mg  1 at breakfast  ?Ozempic 1mg  Inject 1mg  into skin once a week  ? ?Patient declined the following medications  ?None ? ?Patient needs refills -Request Sent  ?Rosuvastatin 5mg  ?Clonazepam 0.5mg  ? ?Confirmed delivery date of 05/22/21, advised patient that pharmacy will contact  them the morning of delivery. ? ? ?Elray Mcgregor, CMA ?Clinical Pharmacist Assistant  ?[phone]  ?

## 2021-05-11 NOTE — Progress Notes (Signed)
? ?Subjective:  ?Patient ID: Anna Greene, female    DOB: 11-22-1955  Age: 66 y.o. MRN: [id number] ? ?Chief Complaint  ?Patient presents with  ? Hypertension  ? Hyperlipidemia  ? Diabetes  ? ?HPI: ?Diabetes:  ?Complications: Hyperlipidemia, hypertension ?Glucose checking: No ?Glucose logs: No ?Hypoglycemia: She is not sure ?Most recent A1C: 5.9% ?Current medications: Ozempic 1 mg weekly. ?Last Eye Exam: July 2022 ?Foot checks: Daily ? ?Hyperlipidemia: ?Current medications: Rosuvastatin 5 mg daily. ? ?Hypertension: ?Complications: Diabetes ?Current medications: Atenolol-chlorthalidone 100-25 mg daily, Losartan 50 mg daily. ? ?GAD:  Taking klonopin 0.5 mg daily PRN. Usually  ? ?Onychomycosis: She is taking terbinafine 250 mg daily. Improved.  ? ?Complaining of right shoulder pain. No injury. Using biofreeze, tylenol and ibuprofen, in addition to celebrex. Patient has been taking ibuprofen 200 mg 2 daily usually.  ? ?Insomnia: Patient is on clonazepam 0.5 mg nightly.  She is waking up twice at night to urinate.  This is unusual for her.  She does has difficulty going back to sleep because her mind races.  She denies depression or anxiety.  Requesting either an increase in her clonazepam or trialing another medicine ?  ?Current Outpatient Medications on File Prior to Visit  ?Medication Sig Dispense Refill  ? atenolol-chlorthalidone (TENORETIC) 100-25 MG tablet Take 1 tablet by mouth daily. 90 tablet 1  ? cetirizine (ZYRTEC) 10 MG tablet Take 1 tablet (10 mg total) by mouth daily. 90 tablet 3  ? clonazePAM (KLONOPIN) 0.5 MG tablet Take 1 tablet (0.5 mg total) by mouth daily as needed. 30 tablet 2  ? gabapentin (NEURONTIN) 300 MG capsule TAKE ONE CAPSULE BY MOUTH TWICE DAILY 180 capsule 0  ? losartan (COZAAR) 50 MG tablet Take 1 tablet (50 mg total) by mouth daily. 90 tablet 1  ? omeprazole (PRILOSEC) 20 MG capsule Take 1 capsule (20 mg total) by mouth daily. 90 capsule 1  ? rosuvastatin (CRESTOR) 5 MG tablet Take 1 tablet (5  mg total) by mouth daily. 90 tablet 1  ? potassium chloride (KLOR-CON) 10 MEQ tablet Take 1 tablet (10 mEq total) by mouth once for 1 dose. 90 tablet 1  ? ?No current facility-administered medications on file prior to visit.  ? ?Past Medical History:  ?Diagnosis Date  ? Benign fasciculation-cramp syndrome   ? Depression   ? Diabetes mellitus without complication (HCC)   ? GERD (gastroesophageal reflux disease)   ? Hyperlipidemia   ? Hypertension   ? Nephrolithiasis   ? Osteoarthritis   ? Bilateral knees, degenerative disc disease.  Patient has had epidural steroid injections x2  ? Plantar fasciitis   ? ?Past Surgical History:  ?Procedure Laterality Date  ? BREAST REDUCTION SURGERY Bilateral 1996  ? CESAREAN SECTION  1976  ? CESAREAN SECTION  1979  ? KNEE ARTHROSCOPY W/ MENISCAL REPAIR Left 1995  ? Per patient muscle was repaired  ? KNEE ARTHROSCOPY W/ MENISCAL REPAIR Right 2003  ? knee surgery Left 10/2020  ? meniscal repair via arthroscopy  ? PANNICULECTOMY    ? REDUCTION MAMMAPLASTY  1996  ? REPLACEMENT TOTAL KNEE Right 2019  ? TONSILLECTOMY  1960  ?  ?Family History  ?Problem Relation Age of Onset  ? Stroke Mother   ? Heart disease Mother   ? Cancer Father   ? Diabetes Sister   ? Anxiety disorder Sister   ? Depression Sister   ? Diabetes Sister   ? Diabetes Sister   ? Arthritis Sister   ? Stroke Brother   ?  Breast cancer Neg Hx   ? ?Social History  ? ?Socioeconomic History  ? Marital status: Married  ?  Spouse name: Not on file  ? Number of children: Not on file  ? Years of education: Not on file  ? Highest education level: Not on file  ?Occupational History  ? Not on file  ?Tobacco Use  ? Smoking status: Never  ? Smokeless tobacco: Never  ?Vaping Use  ? Vaping Use: Never used  ?Substance and Sexual Activity  ? Alcohol use: Never  ? Drug use: Never  ? Sexual activity: Yes  ?  Partners: Male  ?Other Topics Concern  ? Not on file  ?Social History Narrative  ? Patient is married.  Has 2 adult children.  ? ?Social  Determinants of Health  ? ?Financial Resource Strain: High Risk  ? Difficulty of Paying Living Expenses: Very hard  ?Food Insecurity: Not on file  ?Transportation Needs: No Transportation Needs  ? Lack of Transportation (Medical): No  ? Lack of Transportation (Non-Medical): No  ?Physical Activity: Not on file  ?Stress: Not on file  ?Social Connections: Not on file  ? ? ?Review of Systems  ?Constitutional:  Negative for appetite change, fatigue and fever.  ?HENT:  Negative for congestion, ear pain, sinus pressure and sore throat.   ?Eyes:  Negative for pain.  ?Respiratory:  Negative for cough, shortness of breath and wheezing.   ?Cardiovascular:  Negative for chest pain and palpitations.  ?Gastrointestinal:  Negative for abdominal pain, constipation, diarrhea, nausea and vomiting.  ?Genitourinary:  Negative for dysuria and frequency.  ?Musculoskeletal:  Positive for arthralgias (rt shoulder pain). Negative for back pain, joint swelling and myalgias.  ?Skin:  Negative for rash.  ?Neurological:  Negative for dizziness, weakness and headaches.  ?Psychiatric/Behavioral:  Positive for sleep disturbance. Negative for dysphoric mood. The patient is not nervous/anxious.   ? ? ?Objective:  ?BP 100/80   Pulse 96   Temp 98.8 ?F (37.1 ?C)   Ht 5\' 6"  (1.676 m)   Wt 213 lb (96.6 kg)   LMP  (LMP Unknown)   SpO2 96%   BMI 34.38 kg/m?  ? ? ?  05/14/2021  ?  8:16 AM 02/05/2021  ?  8:47 AM 12/26/2020  ?  3:22 PM  ?BP/Weight  ?Systolic BP 100 110 110  ?Diastolic BP 80 76 70  ?Wt. (Lbs) 213 214 217  ?BMI 34.38 kg/m2 34.54 kg/m2 35.02 kg/m2  ? ? ?Physical Exam ?Vitals reviewed.  ?Constitutional:   ?   Appearance: Normal appearance. She is obese.  ?Neck:  ?   Vascular: No carotid bruit.  ?Cardiovascular:  ?   Rate and Rhythm: Normal rate and regular rhythm.  ?   Pulses: Normal pulses.  ?   Heart sounds: Normal heart sounds.  ?Pulmonary:  ?   Effort: Pulmonary effort is normal. No respiratory distress.  ?   Breath sounds: Normal breath  sounds.  ?Abdominal:  ?   General: Abdomen is flat. Bowel sounds are normal.  ?   Palpations: Abdomen is soft.  ?   Tenderness: There is no abdominal tenderness.  ?Neurological:  ?   Mental Status: She is alert and oriented to person, place, and time.  ?Psychiatric:     ?   Mood and Affect: Mood normal.     ?   Behavior: Behavior normal.  ? ? ?Diabetic Foot Exam - Simple   ?Simple Foot Form ? 05/14/2021  9:00 PM  ?Visual Inspection ?No deformities, no  ulcerations, no other skin breakdown bilaterally: Yes ?Sensation Testing ?Intact to touch and monofilament testing bilaterally: Yes ?Pulse Check ?Posterior Tibialis and Dorsalis pulse intact bilaterally: Yes ?Comments ?  ?  ? ?Lab Results  ?Component Value Date  ? WBC 7.7 05/07/2021  ? HGB 13.9 05/07/2021  ? HCT 42.5 05/07/2021  ? PLT 292 05/07/2021  ? GLUCOSE 99 05/07/2021  ? CHOL 154 05/07/2021  ? TRIG 154 (H) 05/07/2021  ? HDL 56 05/07/2021  ? LDLCALC 72 05/07/2021  ? ALT 21 05/07/2021  ? AST 23 05/07/2021  ? NA 139 05/07/2021  ? K 4.5 05/07/2021  ? CL 98 05/07/2021  ? CREATININE 0.97 05/07/2021  ? BUN 13 05/07/2021  ? CO2 29 05/07/2021  ? TSH 0.948 02/21/2020  ? HGBA1C 5.9 (H) 05/07/2021  ? ? ? ? ?Assessment & Plan:  ? ?Problem List Items Addressed This Visit   ? ?  ? Cardiovascular and Mediastinum  ? Hypertension associated with type 2 diabetes mellitus (HCC) - Primary  ?  Well controlled.  ?No changes to medicines. Continue Atenolol-chlorthalidone 100-25 mg daily, Losartan 50 mg daily. ?Continue to work on eating a healthy diet and exercise.  ?Labs reviewed.  ?  ?  ? Relevant Medications  ? Semaglutide, 2 MG/DOSE, 8 MG/3ML SOPN  ? Other Relevant Orders  ? CBC with Differential/Platelet  ? Comprehensive metabolic panel  ?  ? Endocrine  ? Type 2 diabetes mellitus with hyperglycemia, without long-term current use of insulin (HCC)  ?  Control: great ?Recommend check feet daily. ?Recommend annual eye exams. ?Medicines: Increase ozempic to 2 mg weekly.  ?Continue to work  on eating a healthy diet and exercise.  ?Labs reviewed.  ?  ?  ?  ? Relevant Medications  ? Semaglutide, 2 MG/DOSE, 8 MG/3ML SOPN  ? Other Relevant Orders  ? Hemoglobin A1c  ? Microalbumin / creatinine urine

## 2021-05-14 ENCOUNTER — Ambulatory Visit (INDEPENDENT_AMBULATORY_CARE_PROVIDER_SITE_OTHER): Payer: Medicare HMO | Admitting: Family Medicine

## 2021-05-14 ENCOUNTER — Encounter: Payer: Self-pay | Admitting: Family Medicine

## 2021-05-14 VITALS — BP 100/80 | HR 96 | Temp 98.8°F | Ht 66.0 in | Wt 213.0 lb

## 2021-05-14 DIAGNOSIS — I152 Hypertension secondary to endocrine disorders: Secondary | ICD-10-CM

## 2021-05-14 DIAGNOSIS — M5136 Other intervertebral disc degeneration, lumbar region: Secondary | ICD-10-CM

## 2021-05-14 DIAGNOSIS — F5102 Adjustment insomnia: Secondary | ICD-10-CM

## 2021-05-14 DIAGNOSIS — E1169 Type 2 diabetes mellitus with other specified complication: Secondary | ICD-10-CM

## 2021-05-14 DIAGNOSIS — M25511 Pain in right shoulder: Secondary | ICD-10-CM | POA: Diagnosis not present

## 2021-05-14 DIAGNOSIS — F411 Generalized anxiety disorder: Secondary | ICD-10-CM

## 2021-05-14 DIAGNOSIS — G8929 Other chronic pain: Secondary | ICD-10-CM

## 2021-05-14 DIAGNOSIS — B351 Tinea unguium: Secondary | ICD-10-CM | POA: Diagnosis not present

## 2021-05-14 DIAGNOSIS — E1165 Type 2 diabetes mellitus with hyperglycemia: Secondary | ICD-10-CM | POA: Diagnosis not present

## 2021-05-14 DIAGNOSIS — E782 Mixed hyperlipidemia: Secondary | ICD-10-CM | POA: Diagnosis not present

## 2021-05-14 DIAGNOSIS — M51369 Other intervertebral disc degeneration, lumbar region without mention of lumbar back pain or lower extremity pain: Secondary | ICD-10-CM

## 2021-05-14 DIAGNOSIS — R351 Nocturia: Secondary | ICD-10-CM | POA: Diagnosis not present

## 2021-05-14 DIAGNOSIS — E1159 Type 2 diabetes mellitus with other circulatory complications: Secondary | ICD-10-CM | POA: Diagnosis not present

## 2021-05-14 DIAGNOSIS — R69 Illness, unspecified: Secondary | ICD-10-CM | POA: Diagnosis not present

## 2021-05-14 LAB — POCT URINALYSIS DIP (CLINITEK)
Bilirubin, UA: NEGATIVE
Blood, UA: NEGATIVE
Glucose, UA: NEGATIVE mg/dL
Ketones, POC UA: NEGATIVE mg/dL
Leukocytes, UA: NEGATIVE
Nitrite, UA: NEGATIVE
Spec Grav, UA: 1.01 (ref 1.010–1.025)
Urobilinogen, UA: 0.2 E.U./dL
pH, UA: 8.5 — AB (ref 5.0–8.0)

## 2021-05-14 MED ORDER — CELECOXIB 200 MG PO CAPS: 200.0000 mg | ORAL_CAPSULE | Freq: Two times a day (BID) | ORAL | 0 refills | Status: DC

## 2021-05-14 MED ORDER — TRIAMCINOLONE ACETONIDE 40 MG/ML IJ SUSP
40.0000 mg | Freq: Once | INTRAMUSCULAR | Status: AC
  Administered 2021-05-14: 40 mg via INTRA_ARTICULAR

## 2021-05-14 MED ORDER — TERBINAFINE HCL 250 MG PO TABS: 250.0000 mg | ORAL_TABLET | Freq: Every day | ORAL | 1 refills | Status: DC

## 2021-05-14 MED ORDER — ESZOPICLONE 2 MG PO TABS
2.0000 mg | ORAL_TABLET | Freq: Every evening | ORAL | 2 refills | Status: DC | PRN
Start: 2021-05-14 — End: 2021-05-16

## 2021-05-14 MED ORDER — SEMAGLUTIDE (2 MG/DOSE) 8 MG/3ML ~~LOC~~ SOPN: 2.0000 mg | PEN_INJECTOR | SUBCUTANEOUS | 1 refills | Status: DC

## 2021-05-14 NOTE — Patient Instructions (Addendum)
Insomnia: Started on Lunesta 2 mg nightly. ?Anxiety: Continue to take clonazepam 0.5 mg daily as needed. ?Shoulder pain: Get shoulder x-ray.  May use Celebrex 200 mg daily but should not take ibuprofen with this.  She may continue to use Tylenol and Biofreeze.  Patient to call if she would like to do physical therapy.  Also may use ice. ?Diabetes: Increase ozempic to 2 mg weekly.  ?

## 2021-05-15 ENCOUNTER — Telehealth: Payer: Self-pay

## 2021-05-15 LAB — MICROALBUMIN / CREATININE URINE RATIO
Creatinine, Urine: 132.8 mg/dL
Microalb/Creat Ratio: 4 mg/g creat (ref 0–29)
Microalbumin, Urine: 5.6 ug/mL

## 2021-05-15 NOTE — Progress Notes (Signed)
? ? ?  Chronic Care Management ?Pharmacy Assistant  ? ?Name: Sunya Schardt  MRN: [id number] DOB: [birth year] ? ? ?Reason for Encounter: PA submitted on Covermymeds today for  ? ?(Key: [id number]) [id number] ?Eszopiclone 2MG  tablets ? ?Awaiting on approval or denial  ?    ? ?Medications: ?Outpatient Encounter Medications as of 05/15/2021  ?Medication Sig  ? atenolol-chlorthalidone (TENORETIC) 100-25 MG tablet Take 1 tablet by mouth daily.  ? celecoxib (CELEBREX) 200 MG capsule Take 1 capsule (200 mg total) by mouth 2 (two) times daily.  ? cetirizine (ZYRTEC) 10 MG tablet Take 1 tablet (10 mg total) by mouth daily.  ? clonazePAM (KLONOPIN) 0.5 MG tablet Take 1 tablet (0.5 mg total) by mouth daily as needed.  ? eszopiclone (LUNESTA) 2 MG TABS tablet Take 1 tablet (2 mg total) by mouth at bedtime as needed for sleep. Take immediately before bedtime  ? gabapentin (NEURONTIN) 300 MG capsule TAKE ONE CAPSULE BY MOUTH TWICE DAILY  ? losartan (COZAAR) 50 MG tablet Take 1 tablet (50 mg total) by mouth daily.  ? omeprazole (PRILOSEC) 20 MG capsule Take 1 capsule (20 mg total) by mouth daily.  ? potassium chloride (KLOR-CON) 10 MEQ tablet Take 1 tablet (10 mEq total) by mouth once for 1 dose.  ? rosuvastatin (CRESTOR) 5 MG tablet Take 1 tablet (5 mg total) by mouth daily.  ? Semaglutide, 2 MG/DOSE, 8 MG/3ML SOPN Inject 2 mg as directed once a week.  ? terbinafine (LAMISIL) 250 MG tablet Take 1 tablet (250 mg total) by mouth daily.  ? ?No facility-administered encounter medications on file as of 05/15/2021.  ? ? ?Elray Mcgregor, CMA ?Clinical Pharmacist Assistant  ?[phone]  ?

## 2021-05-16 ENCOUNTER — Other Ambulatory Visit: Payer: Self-pay | Admitting: Family Medicine

## 2021-05-16 MED ORDER — BELSOMRA 10 MG PO TABS: 10.0000 mg | ORAL_TABLET | Freq: Every evening | ORAL | 0 refills | Status: DC | PRN

## 2021-05-16 NOTE — Progress Notes (Signed)
PA was deniedfor Eszopiclone 2MG  tablets because pt has to try and fail what is on the formulary as noted: ? ?zaleplon capsule (requires prior authorization for members of age 68 or greater) (Different quantity limits apply  ?depending on the strength of the medication prescribed. Please consult the Medicare Part D plan's formulary  ?for the specific quantity limit.) ?triazolam 0.125 mg tablet (requires prior authorization for members of age 56 or greater) (quantity limit of 60  ?tablets every 30 days) ?temazepam capsule (requires prior authorization for members of age 57 or greater) (quantity limit of 30  ?capsules every 30 days) ?doxepin HCl tablet (quantity limit of 30 tablets every 30 days) ?Dayvigo tablet (quantity limit of 30 tablets every 30 days) ?Belsomra tablet (quantity limit of 30 tablets every 30 days) ? ?Cash price out of pocket is $15.42 through [facility]. I messaged the provider to see what they advise and pharmacy will call pt to let them know what the price is to see if she is willing to pay that or if something else needs to be called in.  ? ?Elray Mcgregor, CMA ?Clinical Pharmacist Assistant  ?[phone]  ?

## 2021-05-17 ENCOUNTER — Ambulatory Visit (INDEPENDENT_AMBULATORY_CARE_PROVIDER_SITE_OTHER): Payer: Medicare HMO

## 2021-05-17 ENCOUNTER — Other Ambulatory Visit: Payer: Self-pay

## 2021-05-17 DIAGNOSIS — E782 Mixed hyperlipidemia: Secondary | ICD-10-CM

## 2021-05-17 DIAGNOSIS — E1159 Type 2 diabetes mellitus with other circulatory complications: Secondary | ICD-10-CM

## 2021-05-17 DIAGNOSIS — E1165 Type 2 diabetes mellitus with hyperglycemia: Secondary | ICD-10-CM

## 2021-05-17 MED ORDER — INSULIN PEN NEEDLE 32G X 6 MM MISC: 1.0000 | Freq: Every day | 2 refills | Status: DC

## 2021-05-17 NOTE — Progress Notes (Signed)
Chronic Care Management Pharmacy Note  05/17/2021 Name:  Anna Greene MRN:  [id number] DOB:  19-Apr-1955  Summary: -Pleasant 67 year old female presents for initial CCM visit. 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 She has been working ever since she was 66 years old. 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 In her free time she loves to camp, has an RV, and loves to travel in it. Her favourite place she's been was [place] in [place] where there was a campground by a river. 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 PCP approved Samples of Victoza daily until we get Trulicity PAP approved (Ozempic PAP's have been denied due to shortage)   Subjective: Anna Greene is an 66 y.o. year old female who is a primary patient of Cox, Kirsten, MD.  The CCM team was consulted for assistance with disease management and care coordination needs.    Engaged with patient by telephone for initial visit in response to provider referral for pharmacy case management and/or care coordination services.   Consent to Services:  The patient was given the following information about Chronic Care Management services today, agreed to services, and gave verbal consent: 1. CCM service includes personalized support from designated clinical staff supervised by the primary care provider, including individualized plan of care and coordination with other care providers 2. 24/7 contact phone numbers for assistance for urgent and routine care needs. 3. Service will only be billed when office clinical staff spend 20 minutes or more in a month to  coordinate care. 4. Only one practitioner may furnish and bill the service in a calendar month. 5.The patient may stop CCM services at any time (effective at the end of the month) by phone call to the office staff. 6. The patient will be responsible for cost sharing (co-pay) of up to 20% of the service fee (after annual deductible is met). Patient agreed to services and consent obtained.  Patient Care Team: Rochel Brome, MD as PCP - General (Family Medicine) Lane Hacker, [facility] as Pharmacist (Pharmacist)  Recent office visits:  None   Recent consult visits:  None   Hospital visits:  None     Objective:  Lab Results  Component Value Date   CREATININE 0.97 05/07/2021   BUN 13 05/07/2021   GFRNONAA 74 02/21/2020   GFRAA 85 02/21/2020   NA 139 05/07/2021   K 4.5 05/07/2021   CALCIUM 10.1 05/07/2021   CO2 29 05/07/2021   GLUCOSE 99 05/07/2021    Lab Results  Component Value Date/Time   HGBA1C 5.9 (H) 05/07/2021 11:17 AM   HGBA1C 6.3 (H) 02/05/2021 09:31 AM    Last diabetic Eye exam:  Lab Results  Component Value Date/Time   HMDIABEYEEXA No Retinopathy 07/24/2020 12:00 AM    Last diabetic Foot exam: No results found for: HMDIABFOOTEX   Lab Results  Component Value Date   CHOL 154 05/07/2021   HDL 56 05/07/2021   LDLCALC 72 05/07/2021   TRIG 154 (H) 05/07/2021   CHOLHDL 2.8 05/07/2021       Latest Ref Rng &  Units 05/07/2021   11:17 AM 02/05/2021    9:31 AM 10/09/2020    9:23 AM  Hepatic Function  Total Protein 6.0 - 8.5 g/dL 7.0   6.8   7.0    Albumin 3.8 - 4.8 g/dL 4.2   4.3   4.9    AST 0 - 40 IU/L _0 ALT 0 - 32 IU/L 21   24   34    Alk Phosphatase 44 - 121 IU/L 88   95   94    Total Bilirubin 0.0 - 1.2 mg/dL 0.4   0.4   0.3      Lab Results  Component Value Date/Time   TSH 0.948 02/21/2020 11:42 AM       Latest Ref Rng & Units 05/07/2021   11:17 AM 02/05/2021    9:31 AM 10/09/2020    9:23 AM  CBC  WBC 3.4 - 10.8 x10E3/uL 7.7   8.0    7.9    Hemoglobin 11.1 - 15.9 g/dL 13.9   13.7   13.4    Hematocrit 34.0 - 46.6 % 42.5   41.5   41.8    Platelets 150 - 450 x10E3/uL 292   295   314      No results found for: VD25OH  Clinical ASCVD: Yes  The 10-year ASCVD risk score (Arnett DK, et al., 2019) is: 9.6%   Values used to calculate the score:     Age: 52 years     Sex: Female     Is Non-Hispanic African American: No     Diabetic: Yes     Tobacco smoker: No     Systolic Blood Pressure: 330 mmHg     Is BP treated: Yes     HDL Cholesterol: 56 mg/dL     Total Cholesterol: 154 mg/dL       05/14/2021    8:23 AM 11/13/2020   10:59 AM 08/01/2020    3:52 PM  Depression screen PHQ 2/9  Decreased Interest 0 0 0  Down, Depressed, Hopeless 0 0 0  PHQ - 2 Score 0 0 0  Altered sleeping 2 0 0  Tired, decreased energy 0 0 0  Change in appetite 0 0 0  Feeling bad or failure about yourself  0 0 0  Trouble concentrating 0 0 0  Moving slowly or fidgety/restless 0 0 0  Suicidal thoughts 0 0 0  PHQ-9 Score 2 0 0  Difficult doing work/chores  Not difficult at all Not difficult at all     Other: (CHADS2VASc if Afib, MMRC or CAT for COPD, ACT, DEXA)  Social History   Tobacco Use  Smoking Status Never  Smokeless Tobacco Never   BP Readings from Last 3 Encounters:  05/14/21 100/80  02/05/21 110/76  12/26/20 110/70   Pulse Readings from Last 3 Encounters:  05/14/21 96  02/05/21 72  12/26/20 88   Wt Readings from Last 3 Encounters:  05/14/21 213 lb (96.6 kg)  02/05/21 214 lb (97.1 kg)  12/26/20 217 lb (98.4 kg)   BMI Readings from Last 3 Encounters:  05/14/21 34.38 kg/m  02/05/21 34.54 kg/m  12/26/20 35.02 kg/m    Assessment/Interventions: Review of patient past medical history, allergies, medications, health status, including review of consultants reports, laboratory and other test data, was performed as part of comprehensive evaluation and provision of chronic care management services.   SDOH:  (Social  Determinants of Health) assessments and  interventions performed: Yes SDOH Interventions    Flowsheet Row Most Recent Value  SDOH Interventions   Financial Strain Interventions Other (Comment)  [See CP]  Transportation Interventions Intervention Not Indicated      SDOH Screenings   Alcohol Screen: Low Risk    Last Alcohol Screening Score (AUDIT): 0  Depression (PHQ2-9): Low Risk    PHQ-2 Score: 2  Financial Resource Strain: High Risk   Difficulty of Paying Living Expenses: Very hard  Food Insecurity: Not on file  Housing: Not on file  Physical Activity: Not on file  Social Connections: Not on file  Stress: Not on file  Tobacco Use: Low Risk    Smoking Tobacco Use: Never   Smokeless Tobacco Use: Never   Passive Exposure: Not on file  Transportation Needs: No Transportation Needs   Lack of Transportation (Medical): No   Lack of Transportation (Non-Medical): No    CCM Care Plan  Allergies  Allergen Reactions   Metformin And Related     Diarrhea   Ramipril     Cough   Tuberculin, Ppd     False positive    Medications Reviewed Today     Reviewed by Lane Hacker, [facility] (Pharmacist) on 05/17/21 at 1443  Med List Status: <None>   Medication Order Taking? Sig Documenting Provider Last Dose Status Informant  atenolol-chlorthalidone (TENORETIC) 100-25 MG tablet [id number]  Take 1 tablet by mouth daily. Cox, Kirsten, MD  Active   celecoxib (CELEBREX) 200 MG capsule [id number]  Take 1 capsule (200 mg total) by mouth 2 (two) times daily. Cox, Kirsten, MD  Active   cetirizine (ZYRTEC) 10 MG tablet [id number]  Take 1 tablet (10 mg total) by mouth daily. Cox, Kirsten, MD  Active   clonazePAM (KLONOPIN) 0.5 MG tablet [id number]  Take 1 tablet (0.5 mg total) by mouth daily as needed. Cox, Kirsten, MD  Active   Dulaglutide (TRULICITY) 3 ZY/6.0YT Bonney Aid [id number] Yes Inject 3 mg into the skin. Use Victoza Samples until Trulicity PAP is approved [provider]  Active    gabapentin (NEURONTIN) 300 MG capsule [id number]  TAKE ONE CAPSULE BY MOUTH TWICE DAILY Cox, Kirsten, MD  Active   liraglutide (VICTOZA) 18 MG/3ML SOPN [id number] Yes Inject 1.8 mg into the skin daily. Use Victoza Samples until Trulicity PAP is approved [provider] Taking Active   losartan (COZAAR) 50 MG tablet [id number]  Take 1 tablet (50 mg total) by mouth daily. Cox, Kirsten, MD  Active   omeprazole (PRILOSEC) 20 MG capsule [id number]  Take 1 capsule (20 mg total) by mouth daily. Cox, Kirsten, MD  Active   potassium chloride (KLOR-CON) 10 MEQ tablet [id number]  Take 1 tablet (10 mEq total) by mouth once for 1 dose. Rochel Brome, MD  Expired 01/12/21 2359   rosuvastatin (CRESTOR) 5 MG tablet [id number]  Take 1 tablet (5 mg total) by mouth daily. Rochel Brome, MD  Active   Semaglutide, 2 MG/DOSE, 8 MG/3ML SOPN [id number] No Inject 2 mg as directed once a week.  Patient not taking: Reported on 05/17/2021   CoxElnita Maxwell, MD Not Taking Consider Medication Status and Discontinue   Suvorexant (BELSOMRA) 10 MG TABS [id number]  Take 10 mg by mouth at bedtime as needed. Cox, Kirsten, MD  Active   terbinafine (LAMISIL) 250 MG tablet [id number]  Take 1 tablet (250 mg total) by mouth daily. Rochel Brome, MD  Active             Patient Active Problem List  Diagnosis Date Noted   GAD (generalized anxiety disorder) 02/14/2021   Type 2 diabetes mellitus with hyperglycemia, without long-term current use of insulin ([place]) 02/14/2021   Onychomycosis 02/14/2021   Acute infection of nasal sinus 02/14/2021   Rash 02/14/2021   Plantar fasciitis 11/14/2020   Class 1 obesity due to excess calories with serious comorbidity and body mass index (BMI) of 34.0 to 34.9 in adult 11/14/2020   Gastroesophageal reflux disease without esophagitis 11/14/2020   Degeneration of lumbar intervertebral disc 05/01/2020   Localized, primary osteoarthritis 05/01/2020   Low back pain 05/01/2020   Lumbar radiculopathy  05/01/2020   Osteoarthritis of knee 05/01/2020   Mixed anxiety and depressive disorder 02/18/2020   Combined hyperlipidemia associated with type 2 diabetes mellitus ([place]) 02/18/2020   Hypertension associated with type 2 diabetes mellitus ([place]) 02/18/2020   Osteoporosis 02/18/2020   Hyperlipidemia 02/18/2020   Foot pain 06/22/2010    Immunization History  Administered Date(s) Administered   Influenza, Quadrivalent, Recombinant, Inj, Pf 10/23/2016   Influenza-Unspecified 11/15/2019, 10/31/2020   Moderna Sars-Covid-2 Vaccination 03/12/2019, 04/14/2019, 11/15/2019, 05/27/2020, 10/31/2020   Pneumococcal Conjugate-13 10/08/2018   Pneumococcal Polysaccharide-23 02/15/2020   Tdap 08/03/2008, 02/15/2013    Conditions to be addressed/monitored:  Hypertension, Hyperlipidemia, Diabetes, and GERD  Care Plan : [place]  Updates made by Lane Hacker, [place] since 05/17/2021 12:00 AM     Problem: DM, Lipids, Cardio, GERD   Priority: High  Onset Date: 01/01/2021     Long-Range Goal: Disease State Management   Start Date: 01/01/2021  Expected End Date: 01/01/2022  Recent Progress: On track  Priority: High  Note:   Current Barriers:  Does not maintain contact with provider office Does not contact provider office for questions/concerns  Pharmacist Clinical Goal(s):  Patient will contact provider office for questions/concerns as evidenced notation of same in electronic health record through collaboration with PharmD and provider.   Interventions: 1:1 collaboration with Cox, Elnita Maxwell, MD regarding development and update of comprehensive plan of care as evidenced by provider attestation and co-signature Inter-disciplinary care team collaboration (see longitudinal plan of care) Comprehensive medication review performed; medication list updated in electronic medical record  Hypertension (BP goal <140/90) BP Readings from Last 3 Encounters:  12/26/20 110/70  11/13/20 104/62   10/09/20 122/72  -Controlled -Current treatment: Atenolol/Chlorthalidone 100/25 QD Appropriate, Effective, Safe, Accessible Losartan 85m QD Appropriate, Effective, Safe, Accessible Kcl 145m Appropriate, Effective, Safe, Accessible -Medications previously tried: N/A  -Current home readings: Didn't have readings -Current dietary habits: "Tries to eat healthy" -Current exercise habits: Works as grEngineer, sitex/week for 5 hour shifts Denies hypotensive/hypertensive symptoms -Educated on BP goals and benefits of medications for prevention of heart attack, stroke and kidney damage; -Counseled to monitor BP at home PRN, document, and provide log at future appointments May 2023: Will ask PCP to send in separate Atenolol/Chlorthalidone, this [facility]  Hyperlipidemia: (LDL goal < 70) The 10-year ASCVD risk score (Arnett DK, et al., 2019) is: 11.2%   Values used to calculate the score:     Age: 89ears     Sex: Female     Is Non-Hispanic African American: No     Diabetic: Yes     Tobacco smoker: No     Systolic Blood Pressure: 11962mHg     Is BP treated: Yes     HDL Cholesterol: 48 mg/dL     Total Cholesterol: 131 mg/dL Lab Results  Component Value Date  CHOL 131 10/09/2020   CHOL 154 02/21/2020   Lab Results  Component Value Date   HDL 48 10/09/2020   HDL 58 02/21/2020   Lab Results  Component Value Date   LDLCALC 55 10/09/2020   LDLCALC 73 02/21/2020   Lab Results  Component Value Date   TRIG 169 (H) 10/09/2020   TRIG 131 02/21/2020   Lab Results  Component Value Date   CHOLHDL 2.7 02/21/2020  No results found for: LDLDIRECT -Controlled -Current treatment: Rosuvastatin 2m Appropriate, Effective, Safe, Accessible -Medications previously tried: N/A  -Current dietary patterns: "Tries to eat healthy" -Current exercise habits: Works as gEngineer, site4x/week for 5 hour shifts -Educated on Cholesterol goals;  -Recommended to continue current  medication  Diabetes (A1c goal <7%) Lab Results  Component Value Date   HGBA1C 6.4 (H) 10/09/2020   HGBA1C 6.0 (H) 02/21/2020   Lab Results  Component Value Date   LDLCALC 55 10/09/2020   CREATININE 0.91 10/09/2020   Lab Results  Component Value Date   NA 140 10/09/2020   K 3.6 10/09/2020   CREATININE 0.91 10/09/2020   EGFR 69 10/09/2020   GFRNONAA 74 02/21/2020   GLUCOSE 108 (H) 10/09/2020   Lab Results  Component Value Date   WBC 7.9 10/09/2020   HGB 13.4 10/09/2020   HCT 41.8 10/09/2020   MCV 89 10/09/2020   PLT 314 10/09/2020  -Controlled -Current medications: Ozempic 136mweek Appropriate, Effective, Safe, Query accessible -Medications previously tried: Metformin  -Current home glucose readings fasting glucose: Doesn't test post prandial glucose: Doesn't test -Denies hypoglycemic/hyperglycemic symptoms -Current exercise habits: Works as grEngineer, sitex/week for 5 hour shifts -Educated on A1c and blood sugar goals; -Counseled to check feet daily and get yearly eye exams May 2023: Unable to afford Ozempic, [facility]. -Sent direct msg to Dr. CoTobie Poetnd LaElissa LovettPatient will use Victoza samples daily (Emphasized to taper up the dose) and we will try to get Trulicity PAP approved (Ozempic PAP's have been denied recently due to shortage). Gave patient my number and recommended she call me a week before samples run out, not the day before -Part time job: $360/2 weeks or $9,360/year SS: $1,469.40/month 17,628/year Total/year: $[id number]-Husband: $2,482/month or $29,784/year Part Time: $496/2 weeks or $12,896/year Total/year: $42,680  -Total both yearly income: $6$37,628-PAP Max income for house of 2: $78,880  GERD (Goal: minimize symptoms of reflux ) -Controlled -Current treatment  Omeprazole 2023mppropriate, Effective, Safe, Accessible Calcium 600m65mVitD3 (20mc73mppropriate, Effective, Safe, Accessible -Medications previously tried: none  reported  -Triggering factors: Daily occurrence, "Burning in my esophagus if I don't take it" -Hx of Bleeds/ulcers: No -Counseled on small meals, elevating head, and sleeping on left side -Recommended to continue current medication and to separate this and her Women's MVI. 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) -Controlled -Current treatment: Gabepentin 200mg 64mAppropriate, Effective, Safe, Accessible Celecoxib 200mg B65mppropriate, Effective, Safe, Accessible -Medications previously tried: N/A  -Pain Scale Today's Vitals   01/01/21 1200  PainSc: 5     Aggravating Factors: Daily use  Pain Type: Neuropathy (From Sx) and musc/Skel)  December 2022: Patient told me, "Gabapentin used to work but doesn't as much anymore." Will ask PCP to increase dose   Patient Goals/Self-Care Activities Patient will:  - take medications as prescribed as evidenced by patient report and record review  Follow Up Plan: The  patient has been provided with contact information for the care management team and has been advised to call with any health related questions or concerns.   CPP F/U June 2023  [place] Constable, Sherian Rein.D. - [phone]        Medication Assistance: None required.  Patient affirms current coverage meets needs.  Compliance/Adherence/Medication fill history: Care Gaps: Last annual wellness visit? 05-01-2020 Last eye exam / retinopathy screening? 07-24-2020 Last diabetic foot exam? Patient stated none because Dr. Tobie Poet normally examines feet.     Star Rating Drugs: Ozempic 1 mg- Last filled 12-26-2020 42 DS Rosuvastatin 5 mg- Last filled 12-09-2020 90 DS Losartan 50 mg- Last filled 12-09-2020 90 DS  Patient's preferred pharmacy is:  [facility] - [place], [place] - [address] [place] [address] Phone: [phone] Fax:  [phone]  Uses pill box? No -   Pt endorses 100% compliance  We discussed: Benefits of medication synchronization, packaging and delivery as well as enhanced pharmacist oversight with [facility]. Patient decided to: Utilize [facility] for medication synchronization, packaging and delivery Verbal consent obtained for [facility] enhanced pharmacy services (medication synchronization, adherence packaging, delivery coordination). A medication sync plan was created to allow patient to get all medications delivered once every 30 to 90 days per patient preference. Patient understands they have freedom to choose pharmacy and clinical pharmacist will coordinate care between all prescribers and [facility].   Care Plan and Follow Up Patient Decision:  Patient agrees to Care Plan and Follow-up.  Plan: The patient has been provided with contact information for the care management team and has been advised to call with any health related questions or concerns.   CPP F/U June  [place] Constable, Pharm.D. - [phone]

## 2021-05-17 NOTE — Patient Instructions (Signed)
Visit Information ? ? Goals Addressed   ?None ?  ? ?Patient Care Plan: Anna Greene  ?  ? ?Problem Identified: DM, Lipids, Cardio, GERD   ?Priority: High  ?Onset Date: 01/01/2021  ?  ? ?Long-Range Goal: Disease State Management   ?Start Date: 01/01/2021  ?Expected End Date: 01/01/2022  ?Recent Progress: On track  ?Priority: High  ?Note:   ?Current Barriers:  ?Does not maintain contact with provider office ?Does not contact provider office for questions/concerns ? ?Pharmacist Clinical Goal(s):  ?Patient will contact provider office for questions/concerns as evidenced notation of same in electronic health record through collaboration with PharmD and provider.  ? ?Interventions: ?1:1 collaboration with Cox, Kirsten, MD regarding development and update of comprehensive plan of care as evidenced by provider attestation and co-signature ?Inter-disciplinary care team collaboration (see longitudinal plan of care) ?Comprehensive medication review performed; medication list updated in electronic medical record ? ?Hypertension (BP goal <140/90) ?BP Readings from Last 3 Encounters:  ?12/26/20 110/70  ?11/13/20 104/62  ?10/09/20 122/72  ?-Controlled ?-Current treatment: ?Atenolol/Chlorthalidone 100/25 QD Appropriate, Effective, Safe, Accessible ?Losartan 72m QD Appropriate, Effective, Safe, Accessible ?Kcl 19m Appropriate, Effective, Safe, Accessible ?-Medications previously tried: N/A  ?-Current home readings: Didn't have readings ?-Current dietary habits: "Tries to eat healthy" ?-Current exercise habits: Works as grEngineer, sitex/week for 5 hour shifts ?Denies hypotensive/hypertensive symptoms ?-Educated on BP goals and benefits of medications for prevention of heart attack, stroke and kidney damage; ?-Counseled to monitor BP at home PRN, document, and provide log at future appointments ?May 2023: Will ask PCP to send in separate Atenolol/Chlorthalidone, [facility] ? ?Hyperlipidemia: (LDL goal <  70) ?The 10-year ASCVD risk score (Arnett DK, et al., 2019) is: 11.2% ?  Values used to calculate the score: ?    Age: [age] ?    Sex: Female ?    Is Non-Hispanic African American: No ?    Diabetic: Yes ?    Tobacco smoker: No ?    Systolic Blood Pressure: 11161mHg ?    Is BP treated: Yes ?    HDL Cholesterol: 48 mg/dL ?    Total Cholesterol: 131 mg/dL ?Lab Results  ?Component Value Date  ? CHOL 131 10/09/2020  ? CHOL 154 02/21/2020  ? ?Lab Results  ?Component Value Date  ? HDL 48 10/09/2020  ? HDL 58 02/21/2020  ? ?Lab Results  ?Component Value Date  ? LDJefferson5 10/09/2020  ? LDChatsworth3 02/21/2020  ? ?Lab Results  ?Component Value Date  ? TRIG 169 (H) 10/09/2020  ? TRIG 131 02/21/2020  ? ?Lab Results  ?Component Value Date  ? CHOLHDL 2.7 02/21/2020  ?No results found for: LDLDIRECT ?-Controlled ?-Current treatment: ?Rosuvastatin 71m371mppropriate, Effective, Safe, Accessible ?-Medications previously tried: N/A  ?-Current dietary patterns: "Tries to eat healthy" ?-Current exercise habits: Works as groEngineer, site/week for 5 hour shifts ?-Educated on Cholesterol goals;  ?-Recommended to continue current medication ? ?Diabetes (A1c goal <7%) ?Lab Results  ?Component Value Date  ? HGBA1C 6.4 (H) 10/09/2020  ? HGBA1C 6.0 (H) 02/21/2020  ? ?Lab Results  ?Component Value Date  ? LDLRose 10/09/2020  ? CREATININE 0.91 10/09/2020  ? ?Lab Results  ?Component Value Date  ? NA 140 10/09/2020  ? K 3.6 10/09/2020  ? CREATININE 0.91 10/09/2020  ? EGFR 69 10/09/2020  ? GFRNONAA 74 02/21/2020  ? GLUCOSE 108 (H) 10/09/2020  ? ?Lab Results  ?Component Value Date  ? WBC  7.9 10/09/2020  ? HGB 13.4 10/09/2020  ? HCT 41.8 10/09/2020  ? MCV 89 10/09/2020  ? PLT 314 10/09/2020  ?-Controlled ?-Current medications: ?Ozempic 60m/week Appropriate, Effective, Safe, Query accessible ?-Medications previously tried: Metformin  ?-Current home glucose readings ?fasting glucose: Doesn't test ?post prandial glucose: Doesn't test ?-Denies  hypoglycemic/hyperglycemic symptoms ?-Current exercise habits: Works as gEngineer, site4x/week for 5 hour shifts ?-Educated on A1c and blood sugar goals; ?-Counseled to check feet daily and get yearly eye exams ?May 2023: [facility] afford [facility]pic[facility] ?-Sent direct msg to Dr. CTobie Poetand Anna Greene Patient will use Victoza samples daily (Emphasized to taper up the dose) and we will try to get Trulicity PAP approved (Ozempic PAP's have been denied recently due to shortage). Gave patient my number and recommended she call me a week before samples run out, not the day before ?-Part time job: $360/2 weeks or $9,360/year ?SS: $1,469.40/month ?17,628/year ?Total/year: $$29,937? ?-Husband: $2,482/month or $29,784/year ?Part Time: $496/2 weeks or $12,896/year ?Total/year: $$16,967? ?-Total both yearly income: $[phone]? ?-PAP Max income for house of 2: [phone]? ?GERD (Goal: minimize symptoms of reflux ) ?-Controlled ?-Current treatment  ?Omeprazole 251mAppropriate, Effective, Safe, Accessible ?Calcium 60069m VitD3 (81m97mAppropriate, Effective, Safe, Accessible ?-Medications previously tried: none reported  ?-Triggering factors: Daily occurrence, "Burning in my esophagus if I don't take it" ?-Hx of Bleeds/ulcers: No ?-Counseled on small meals, elevating head, and sleeping on left side ?-Recommended to continue current medication and to separate this and her Women's MVI. Also, to use Citrate, not Carbonate ? ? 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 01/01/21 1200  ?PainSc: 5   ?  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 ? ?Patient Goals/Self-Care Activities ?Patient will:  ?- take medications as prescribed as evidenced by patient report and record review ? ?Follow Up Plan: The patient has been provided with contact information for the care management team and has been advised to call with any health related questions or concerns.  ? ?CPP F/U June 2023 ? ?NathanArizona Constablem.D. - [phone] ? ? ?  ? ? ?Anna Greene was given information about Chronic Care Management services today including:  ?CCM service includes personalized support from designated clinical staff supervised by her physician, including individualized plan of care and coordination with other care providers ?24/7 contact phone numbers for assistance for urgent and routine care needs. ?Standard insurance, coinsurance, copays and deductibles apply for chronic care management only during months in which we provide at least 20 minutes of these services. Most insurances cover these services at 100%, however patients may be responsible for any copay, coinsurance and/or deductible if applicable. This service may help you avoid the need for more expensive face-to-face services. ?Only one practitioner may furnish and bill the service in a calendar month. ?The patient may stop CCM services at any time (effective at the end of the month) by phone call to the office staff. ? ?Patient agreed to services and verbal consent obtained.  ? ?The patient verbalized understanding of instructions, educational materials, and care plan provided today and declined offer to receive  copy of patient instructions, educational materials, and care plan.  ?The pharmacy team will reach out to the patient again over the next 30 days.  ? ?Lane Hacker, [place] ? ?

## 2021-05-18 NOTE — Progress Notes (Signed)
Coordinated with [employer] to get Victoza Needles delivered for next week ?

## 2021-05-23 ENCOUNTER — Telehealth: Payer: Self-pay

## 2021-05-23 NOTE — Progress Notes (Signed)
? ? ?  Chronic Care Management ?Pharmacy Assistant  ? ?Name: Anna Greene  MRN: [id number] DOB: May 07, 1955 ? ? ?Reason for Encounter: PAP for Victoza ?  ?PAP has been completed for Victoza 1.8mg  through [employer] and uploaded to folder. This will be mailed out to pt home to finish completing. Pt has been informed of instructions.  ? ?Medications: ?Outpatient Encounter Medications as of 05/23/2021  ?Medication Sig  ? atenolol-chlorthalidone (TENORETIC) 100-25 MG tablet Take 1 tablet by mouth daily.  ? celecoxib (CELEBREX) 200 MG capsule Take 1 capsule (200 mg total) by mouth 2 (two) times daily.  ? cetirizine (ZYRTEC) 10 MG tablet Take 1 tablet (10 mg total) by mouth daily.  ? clonazePAM (KLONOPIN) 0.5 MG tablet Take 1 tablet (0.5 mg total) by mouth daily as needed.  ? Dulaglutide (TRULICITY) 3 MG/0.5ML SOPN Inject 3 mg into the skin. Use Victoza Samples until Trulicity PAP is approved  ? gabapentin (NEURONTIN) 300 MG capsule TAKE ONE CAPSULE BY MOUTH TWICE DAILY  ? Insulin Pen Needle 32G X 6 MM MISC 1 each by Does not apply route daily. Use with victoza samples  ? liraglutide (VICTOZA) 18 MG/3ML SOPN Inject 1.8 mg into the skin daily. Use Victoza Samples until Trulicity PAP is approved  ? losartan (COZAAR) 50 MG tablet Take 1 tablet (50 mg total) by mouth daily.  ? omeprazole (PRILOSEC) 20 MG capsule Take 1 capsule (20 mg total) by mouth daily.  ? potassium chloride (KLOR-CON) 10 MEQ tablet Take 1 tablet (10 mEq total) by mouth once for 1 dose.  ? rosuvastatin (CRESTOR) 5 MG tablet Take 1 tablet (5 mg total) by mouth daily.  ? Semaglutide, 2 MG/DOSE, 8 MG/3ML SOPN Inject 2 mg as directed once a week. (Patient not taking: Reported on 05/17/2021)  ? Suvorexant (BELSOMRA) 10 MG TABS Take 10 mg by mouth at bedtime as needed.  ? terbinafine (LAMISIL) 250 MG tablet Take 1 tablet (250 mg total) by mouth daily.  ? ?No facility-administered encounter medications on file as of 05/23/2021.  ? ? ?Roxana Hires, CMA ?Clinical  Pharmacist Assistant  ?[phone]  ?

## 2021-05-27 DIAGNOSIS — M5136 Other intervertebral disc degeneration, lumbar region: Secondary | ICD-10-CM | POA: Insufficient documentation

## 2021-05-27 DIAGNOSIS — R351 Nocturia: Secondary | ICD-10-CM | POA: Insufficient documentation

## 2021-05-27 DIAGNOSIS — G4700 Insomnia, unspecified: Secondary | ICD-10-CM | POA: Insufficient documentation

## 2021-05-27 DIAGNOSIS — G8929 Other chronic pain: Secondary | ICD-10-CM | POA: Insufficient documentation

## 2021-05-27 NOTE — Assessment & Plan Note (Signed)
Celebrex

## 2021-05-27 NOTE — Assessment & Plan Note (Signed)
Check UA 

## 2021-05-27 NOTE — Assessment & Plan Note (Signed)
Start on Lunesta 2 mg nightly. ? ?

## 2021-05-27 NOTE — Assessment & Plan Note (Signed)
Continue to take clonazepam 0.5 mg daily as needed. ? ?

## 2021-05-27 NOTE — Assessment & Plan Note (Addendum)
Well controlled.  ?No changes to medicines. Continue Atenolol-chlorthalidone 100-25 mg daily, Losartan 50 mg daily. ?Continue to work on eating a healthy diet and exercise.  ?Labs reviewed.  ?

## 2021-05-27 NOTE — Assessment & Plan Note (Addendum)
Control: great ?Recommend check feet daily. ?Recommend annual eye exams. ?Medicines: Increase ozempic to 2 mg weekly.  ?Continue to work on eating a healthy diet and exercise.  ?Labs reviewed.  ?  ?

## 2021-05-27 NOTE — Assessment & Plan Note (Addendum)
Get shoulder x-ray.  May use Celebrex 200 mg daily but should not take ibuprofen with this.  She may continue to use Tylenol and Biofreeze.  Patient to call if she would like to do physical therapy.  Also may use ice. ? ?Risks were discussed including bleeding, infection, increase in sugars if diabetic, atrophy at site of injection, and increased pain.  After consent was obtained, using sterile technique the  shoulder was prepped with alcohol.  The joint was entered posteriorly and  Kenalog 40 mg and 5 ml plain Lidocaine was then injected and the needle withdrawn.  The procedure was well tolerated.   ?The patient is asked to continue to rest the joint for a few more days before resuming regular activities.  It may be more painful for the first 1-2 days.  Watch for fever, or increased swelling or persistent pain in the joint. Call or return to clinic prn if such symptoms occur or there is failure to improve as anticipated. ?

## 2021-05-27 NOTE — Assessment & Plan Note (Addendum)
Well controlled.  ?No changes to medicines. Continue crestor 5 mg before bed.  ?Continue to work on eating a healthy diet and exercise.  ?Labs reviewed.  ?

## 2021-05-27 NOTE — Assessment & Plan Note (Signed)
Continue terbinafine

## 2021-05-28 ENCOUNTER — Ambulatory Visit (INDEPENDENT_AMBULATORY_CARE_PROVIDER_SITE_OTHER): Payer: Medicare HMO | Admitting: Family Medicine

## 2021-05-28 VITALS — BP 122/74 | HR 81 | Resp 16 | Wt 211.2 lb

## 2021-05-28 DIAGNOSIS — Z0000 Encounter for general adult medical examination without abnormal findings: Secondary | ICD-10-CM | POA: Diagnosis not present

## 2021-05-28 NOTE — Patient Instructions (Signed)

## 2021-05-28 NOTE — Progress Notes (Signed)
? ?Subjective:  ? Anna MattesLois Greene is a 66 y.o. female who presents for Medicare Annual (Subsequent) preventive examination.  This wellness visit is conducted by a nurse.  The patient's medications were reviewed and reconciled since the patient's last visit.  History details were provided by the patient.  The history appears to be reliable.   ? ?Medical History: Patient history and Family history was reviewed  ?Medications, Allergies, and preventative health maintenance was reviewed and updated. ? ? ?Cardiac Risk Factors include: advanced age (>3055men, 30>65 women);diabetes mellitus;dyslipidemia;obesity (BMI >30kg/m2) ? ?   ?Objective:  ?  ?Today's Vitals  ? 05/28/21 1131 05/28/21 1352  ?BP:  122/74  ?Pulse:  81  ?Resp:  16  ?SpO2:  96%  ?Weight:  211 lb 3.2 oz (95.8 kg)  ?PainSc: 5  5   ? ?Body mass index is 34.09 kg/m?. ? ? ?  05/01/2020  ? 10:01 AM  ?Advanced Directives  ?Does Patient Have a Medical Advance Directive? No  ?Would patient like information on creating a medical advance directive? No - Patient declined  ? ? ?Current Medications (verified) ?Outpatient Encounter Medications as of 05/28/2021  ?Medication Sig  ? atenolol-chlorthalidone (TENORETIC) 100-25 MG tablet Take 1 tablet by mouth daily.  ? celecoxib (CELEBREX) 200 MG capsule Take 1 capsule (200 mg total) by mouth 2 (two) times daily.  ? cetirizine (ZYRTEC) 10 MG tablet Take 1 tablet (10 mg total) by mouth daily.  ? clonazePAM (KLONOPIN) 0.5 MG tablet Take 1 tablet (0.5 mg total) by mouth daily as needed.  ? Dulaglutide (TRULICITY) 3 MG/0.5ML SOPN Inject 3 mg into the skin. Use Victoza Samples until Trulicity PAP is approved  ? gabapentin (NEURONTIN) 300 MG capsule TAKE ONE CAPSULE BY MOUTH TWICE DAILY  ? Insulin Pen Needle 32G X 6 MM MISC 1 each by Does not apply route daily. Use with victoza samples  ? liraglutide (VICTOZA) 18 MG/3ML SOPN Inject 1.8 mg into the skin daily. Use Victoza Samples until Trulicity PAP is approved  ? losartan (COZAAR) 50 MG  tablet Take 1 tablet (50 mg total) by mouth daily.  ? omeprazole (PRILOSEC) 20 MG capsule Take 1 capsule (20 mg total) by mouth daily.  ? rosuvastatin (CRESTOR) 5 MG tablet Take 1 tablet (5 mg total) by mouth daily.  ? Semaglutide, 2 MG/DOSE, 8 MG/3ML SOPN Inject 2 mg as directed once a week.  ? Suvorexant (BELSOMRA) 10 MG TABS Take 10 mg by mouth at bedtime as needed.  ? terbinafine (LAMISIL) 250 MG tablet Take 1 tablet (250 mg total) by mouth daily.  ? potassium chloride (KLOR-CON) 10 MEQ tablet Take 1 tablet (10 mEq total) by mouth once for 1 dose.  ? ?No facility-administered encounter medications on file as of 05/28/2021.  ? ? ?Allergies (verified) ?Metformin and related; Ramipril; and Tuberculin, ppd  ? ?History: ?Past Medical History:  ?Diagnosis Date  ? Benign fasciculation-cramp syndrome   ? Depression   ? Diabetes mellitus without complication (HCC)   ? GERD (gastroesophageal reflux disease)   ? Hyperlipidemia   ? Hypertension   ? Nephrolithiasis   ? Osteoarthritis   ? Bilateral knees, degenerative disc disease.  Patient has had epidural steroid injections x2  ? Plantar fasciitis   ? ?Past Surgical History:  ?Procedure Laterality Date  ? BREAST REDUCTION SURGERY Bilateral 1996  ? CESAREAN SECTION  1976  ? CESAREAN SECTION  1979  ? KNEE ARTHROSCOPY W/ MENISCAL REPAIR Left 1995  ? Per patient muscle was repaired  ?  KNEE ARTHROSCOPY W/ MENISCAL REPAIR Right 2003  ? knee surgery Left 10/2020  ? meniscal repair via arthroscopy  ? PANNICULECTOMY    ? REDUCTION MAMMAPLASTY  1996  ? REPLACEMENT TOTAL KNEE Right 2019  ? TONSILLECTOMY  1960  ? ?Family History  ?Problem Relation Age of Onset  ? Stroke Mother   ? Heart disease Mother   ? Cancer Father   ? Diabetes Sister   ? Anxiety disorder Sister   ? Depression Sister   ? Diabetes Sister   ? Diabetes Sister   ? Arthritis Sister   ? Stroke Brother   ? Breast cancer Neg Hx   ? ?Social History  ? ?Socioeconomic History  ? Marital status: Married  ?  Spouse name: Anna Greene   ? Number of children: 2  ? Years of education: Not on file  ? Highest education level: Not on file  ?Occupational History  ? Occupation: TEFL teacher  ? Occupation: Retired Teacher, music  ?Tobacco Use  ? Smoking status: Never  ? Smokeless tobacco: Never  ?Vaping Use  ? Vaping Use: Never used  ?Substance and Sexual Activity  ? Alcohol use: Never  ? Drug use: Never  ? Sexual activity: Yes  ?  Partners: Male  ?Other Topics Concern  ? Not on file  ?Social History Narrative  ? Patient is married.  Has 2 adult children that live in [place] (where she is from)  ? ?Social Determinants of Health  ? ?Financial Resource Strain: Low Risk   ? Difficulty of Paying Living Expenses: Not hard at all  ?Food Insecurity: No Food Insecurity  ? Worried About Programme researcher, broadcasting/film/video in the Last Year: Never true  ? Ran Out of Food in the Last Year: Never true  ?Transportation Needs: No Transportation Needs  ? Lack of Transportation (Medical): No  ? Lack of Transportation (Non-Medical): No  ?Physical Activity: Insufficiently Active  ? Days of Exercise per Week: 3 days  ? Minutes of Exercise per Session: 40 min  ?Stress: No Stress Concern Present  ? Feeling of Stress : Only a little  ?Social Connections: Unknown  ? Frequency of Communication with Friends and Family: Twice a week  ? Frequency of Social Gatherings with Friends and Family: Patient refused  ? Attends Religious Services: Not on file  ? Active Member of Clubs or Organizations: No  ? Attends Banker Meetings: Never  ? Marital Status: Married  ? ? ?Tobacco Counseling ?Counseling given: Not Answered ? ? ?Clinical Intake: ? ?Pre-visit preparation completed: Yes ? ?Pain : 0-10 ?Pain Score: 5  ?Pain Type: Chronic pain ?Pain Location: Other (Comment) (Right knee, low back) ?Pain Descriptors / Indicators: Aching ?Pain Frequency: Intermittent ? ?  ? ?BMI - recorded: 34.09 ?Nutritional Status: BMI > 30  Obese ?Nutritional Risks: None ?Diabetes: Yes (A1C 5.9) ?CBG done?:  No ?Did pt. bring in CBG monitor from home?: No ? ?How often do you need to have someone help you when you read instructions, pamphlets, or other written materials from your doctor or pharmacy?: 1 - Never ?Interpreter Needed?: No ?  ?Activities of Daily Living ? ?  05/28/2021  ? 11:33 AM 05/24/2021  ?  1:41 PM  ?In your present state of health, do you have any difficulty performing the following activities:  ?Hearing? 0 0  ?Vision? 0 0  ?Difficulty concentrating or making decisions? 0 0  ?Walking or climbing stairs? 0 0  ?Dressing or bathing? 0 0  ?Doing errands, shopping? 0  0  ?Preparing Food and eating ? N N  ?Using the Toilet? N N  ?In the past six months, have you accidently leaked urine? N N  ?Do you have problems with loss of bowel control? N N  ?Managing your Medications? N N  ?Managing your Finances? N N  ?Housekeeping or managing your Housekeeping? N N  ? ? ?Patient Care Team: ?CoxFritzi Mandes, MD as PCP - General (Family Medicine) ?Zettie Pho, [facility] as Pharmacist (Pharmacist) ?Gean Birchwood, MD as Consulting Physician (Orthopedic Surgery) ?Marcelline Deist, MD as Consulting Physician (Ophthalmology) ? ?Indicate any recent Medical Services you may have received from other than [facility] providers in the past year (date may be approximate). ? ?   ?Assessment:  ? This is a routine wellness examination for [place]. ? ?Hearing/Vision screen ?No results found. ? ?Dietary issues and exercise activities discussed: ?Current Exercise Habits: Home exercise routine, Type of exercise: walking;Other - see comments (recumbent bike), Time (Minutes): 40, Frequency (Times/Week): 3, Weekly Exercise (Minutes/Week): 120, Intensity: Moderate, Exercise limited by: None identified ? ? Goals Addressed   ?None ?  ? ?Depression Screen ? ?  05/28/2021  ?  2:31 PM 05/14/2021  ?  8:23 AM 11/13/2020  ? 10:59 AM 08/01/2020  ?  3:52 PM 05/01/2020  ? 10:00 AM 02/15/2020  ?  4:15 PM  ?PHQ 2/9 Scores  ?PHQ - 2 Score 0 0 0 0 0 0  ?PHQ- 9 Score 2 2 0 0     ?  ?Fall Risk ? ?  05/28/2021  ? 11:32 AM 05/24/2021  ?  1:41 PM 11/13/2020  ? 10:59 AM 08/01/2020  ?  3:51 PM 05/01/2020  ?  2:52 PM  ?Fall Risk   ?Falls in the past year? 0 0 0 0   ?Number falls in past yr: 0

## 2021-05-31 ENCOUNTER — Telehealth: Payer: Self-pay

## 2021-05-31 NOTE — Chronic Care Management (AMB) (Signed)
[employer] patient assistance application for Victoza 18 mg/ 3 ml.   Billee Cashing, CMA Clinical Pharmacist Assistant [phone]

## 2021-06-08 ENCOUNTER — Telehealth: Payer: Self-pay

## 2021-06-08 NOTE — Progress Notes (Signed)
Chronic Care Management Pharmacy Assistant   Name: Anna Greene  MRN: [id number] DOB: May 06, 1955   Reason for Encounter: Medication Coordination for [facility]    Recent office visits:  05/28/21 Blane Ohara MD. Seen for Medicare Annual Wellness. No med changes.   Recent consult visits:  None  Hospital visits:  None  Medications: Outpatient Encounter Medications as of 06/08/2021  Medication Sig   atenolol-chlorthalidone (TENORETIC) 100-25 MG tablet Take 1 tablet by mouth daily.   celecoxib (CELEBREX) 200 MG capsule Take 1 capsule (200 mg total) by mouth 2 (two) times daily.   cetirizine (ZYRTEC) 10 MG tablet Take 1 tablet (10 mg total) by mouth daily.   clonazePAM (KLONOPIN) 0.5 MG tablet Take 1 tablet (0.5 mg total) by mouth daily as needed.   Dulaglutide (TRULICITY) 3 MG/0.5ML SOPN Inject 3 mg into the skin. Use Victoza Samples until Trulicity PAP is approved   gabapentin (NEURONTIN) 300 MG capsule TAKE ONE CAPSULE BY MOUTH TWICE DAILY   Insulin Pen Needle 32G X 6 MM MISC 1 each by Does not apply route daily. Use with victoza samples   liraglutide (VICTOZA) 18 MG/3ML SOPN Inject 1.8 mg into the skin daily. Use Victoza Samples until Trulicity PAP is approved   losartan (COZAAR) 50 MG tablet Take 1 tablet (50 mg total) by mouth daily.   omeprazole (PRILOSEC) 20 MG capsule Take 1 capsule (20 mg total) by mouth daily.   potassium chloride (KLOR-CON) 10 MEQ tablet Take 1 tablet (10 mEq total) by mouth once for 1 dose.   rosuvastatin (CRESTOR) 5 MG tablet Take 1 tablet (5 mg total) by mouth daily.   Semaglutide, 2 MG/DOSE, 8 MG/3ML SOPN Inject 2 mg as directed once a week.   Suvorexant (BELSOMRA) 10 MG TABS Take 10 mg by mouth at bedtime as needed.   terbinafine (LAMISIL) 250 MG tablet Take 1 tablet (250 mg total) by mouth daily.   No facility-administered encounter medications on file as of 06/08/2021.    Reviewed chart for medication changes ahead of medication coordination  call.  No  Consults, or hospital visits since last care coordination call/Pharmacist visit.   No medication changes indicated OR if recent visit, treatment plan here.  BP Readings from Last 3 Encounters:  05/28/21 122/74  05/14/21 100/80  02/05/21 110/76    Lab Results  Component Value Date   HGBA1C 5.9 (H) 05/07/2021     Patient obtains medications through Adherence Packaging  30 Days   Last adherence delivery included:  Potassium Chloride Er 1 at Breakfast Losartan 50mg  1 at breakfast Atenolol-Chlorthalidone 100-25mg  1 at breakfast Celecoxib 200mg  1 at breakfast and 1 at evening meal Gabapentin 300mg  1 at breakfast and 1 at evening meal Omeprazole 20mg  1 at breakfast Clonazepam 0.5mg  1 tab once daily prn (Bottles)  Rosuvastatin 5mg  1 at evening meal Cetirizine 10mg  1 at evening meal Calcium/Vitamin D3 1 at breakfast and 1 at evening meal Terbinafine HCI 250mg  1 at breakfast  Ozempic 1mg  Inject 1mg  into skin once a week   Patient declined (meds) last month None  Patient is due for next adherence delivery on: 06/19/21. Called patient and reviewed medications and coordinated delivery.  Changed delivery date from 6/8 because pt is going out of town on the 7th.   This delivery to include: Potassium Chloride Er 1 at Breakfast Losartan 50mg  1 at breakfast Atenolol-Chlorthalidone 100-25mg  1 at breakfast Celecoxib 200mg  1 at breakfast and 1 at evening meal Gabapentin 300mg  1 at  breakfast and 1 at evening meal Omeprazole 20mg  1 at breakfast Clonazepam 0.5mg  1 tab once daily prn (Bottles)  Rosuvastatin 5mg  1 at evening meal Cetirizine 10mg  1 at evening meal Calcium/Vitamin D3 1 at breakfast and 1 at evening meal Terbinafine HCI 250mg  1 at breakfast   Patient declined the following medications  pen needles not due until 08/26/21 Victoza 18mg - PAP, receiving samples  Trulicity 3mg -Not taking- is taking Victoza for now  Ozempic 1mg - Not taking , is  taking Victoza for now    Patient needs refills -Request Sent  Gabapentin 300mg    Confirmed delivery date of 06/19/21, advised patient that pharmacy will contact them the morning of delivery.   , CMA Clinical Pharmacist Assistant  [phone]

## 2021-06-12 NOTE — Telephone Encounter (Signed)
Compliant on meds 

## 2021-06-13 DIAGNOSIS — Z7984 Long term (current) use of oral hypoglycemic drugs: Secondary | ICD-10-CM

## 2021-06-13 DIAGNOSIS — E1169 Type 2 diabetes mellitus with other specified complication: Secondary | ICD-10-CM | POA: Diagnosis not present

## 2021-06-13 DIAGNOSIS — I10 Essential (primary) hypertension: Secondary | ICD-10-CM | POA: Diagnosis not present

## 2021-06-13 DIAGNOSIS — E785 Hyperlipidemia, unspecified: Secondary | ICD-10-CM

## 2021-06-14 ENCOUNTER — Other Ambulatory Visit: Payer: Self-pay | Admitting: Family Medicine

## 2021-06-14 ENCOUNTER — Telehealth: Payer: Self-pay

## 2021-06-14 NOTE — Chronic Care Management (AMB) (Signed)
Novo Nordisk patient assistance program notification:  Patient approved with [employer] patient assistance program and eligible to receive medication through 12/13/2021. A four month supply Victozia will arrive to the office in 10-14 business days.  Billee Cashing, CMA Clinical Pharmacist Assistant [phone]

## 2021-06-15 ENCOUNTER — Ambulatory Visit (INDEPENDENT_AMBULATORY_CARE_PROVIDER_SITE_OTHER): Payer: Medicare HMO

## 2021-06-15 DIAGNOSIS — F418 Other specified anxiety disorders: Secondary | ICD-10-CM

## 2021-06-15 DIAGNOSIS — E1159 Type 2 diabetes mellitus with other circulatory complications: Secondary | ICD-10-CM

## 2021-06-15 DIAGNOSIS — E1169 Type 2 diabetes mellitus with other specified complication: Secondary | ICD-10-CM

## 2021-06-15 NOTE — Progress Notes (Signed)
Chronic Care Management Pharmacy Note  06/15/2021 Name:  Anna Greene MRN:  [id number] DOB:  14-Jul-1955  Summary: -Pleasant 66 year old female presents for initial CCM visit. 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 She has been working ever since she was 66 years old. 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 In her free time she loves to camp, has an RV, and loves to travel in it. Her favourite place she's been was [place] in [place] where there was a campground by a river. 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 year old female who is a primary patient of Cox, Kirsten, MD.  The CCM team was consulted for assistance with disease management and care coordination needs.    Engaged with patient by telephone for initial visit in response to provider referral for pharmacy case management and/or care coordination services.   Consent to Services:  The patient was given the following information about Chronic Care Management services today, agreed to services, and gave verbal consent: 1. CCM service includes personalized support from designated clinical staff supervised by the primary care provider, including individualized plan of care and coordination with other care providers 2. 24/7 contact phone numbers for assistance for urgent and routine care needs. 3. Service will only be billed when office clinical staff spend 20 minutes or more in a month to coordinate care. 4. Only one practitioner may furnish and bill the service in a calendar month. 5.The patient may stop CCM services at any time  (effective at the end of the month) by phone call to the office staff. 6. The patient will be responsible for cost sharing (co-pay) of up to 20% of the service fee (after annual deductible is met). Patient agreed to services and consent obtained.  Patient Care Team: Rochel Brome, MD as PCP - General (Family Medicine) Lane Hacker, [facility] as Pharmacist (Pharmacist) Frederik Pear, MD as Consulting Physician (Orthopedic Surgery) Alanda Slim Neena Rhymes, MD as Consulting Physician (Ophthalmology)  Recent office visits:  None   Recent consult visits:  None   Hospital visits:  None     Objective:  Lab Results  Component Value Date   CREATININE 0.97 05/07/2021   BUN 13 05/07/2021   GFRNONAA 74 02/21/2020   GFRAA 85 02/21/2020   NA 139 05/07/2021   K 4.5 05/07/2021   CALCIUM 10.1 05/07/2021   CO2 29 05/07/2021   GLUCOSE 99 05/07/2021    Lab Results  Component Value Date/Time   HGBA1C 5.9 (H) 05/07/2021 11:17 AM   HGBA1C 6.3 (H) 02/05/2021 09:31 AM    Last diabetic Eye exam:  Lab Results  Component Value Date/Time   HMDIABEYEEXA No Retinopathy 07/24/2020 12:00 AM    Last diabetic Foot exam: No results found for: HMDIABFOOTEX   Lab Results  Component Value Date   CHOL 154 05/07/2021   HDL 56 05/07/2021   LDLCALC 72 05/07/2021   TRIG 154 (H) 05/07/2021   CHOLHDL 2.8 05/07/2021       Latest Ref Rng & Units 05/07/2021   11:17 AM 02/05/2021  9:31 AM 10/09/2020    9:23 AM  Hepatic Function  Total Protein 6.0 - 8.5 g/dL 7.0   6.8   7.0    Albumin 3.8 - 4.8 g/dL 4.2   4.3   4.9    AST 0 - 40 IU/L _0 ALT 0 - 32 IU/L 21   24   34    Alk Phosphatase 44 - 121 IU/L 88   95   94    Total Bilirubin 0.0 - 1.2 mg/dL 0.4   0.4   0.3      Lab Results  Component Value Date/Time   TSH 0.948 02/21/2020 11:42 AM       Latest Ref Rng & Units 05/07/2021   11:17 AM 02/05/2021    9:31 AM 10/09/2020    9:23 AM  CBC  WBC 3.4 - 10.8 x10E3/uL 7.7   8.0   7.9     Hemoglobin 11.1 - 15.9 g/dL 13.9   13.7   13.4    Hematocrit 34.0 - 46.6 % 42.5   41.5   41.8    Platelets 150 - 450 x10E3/uL 292   295   314      No results found for: VD25OH  Clinical ASCVD: Yes  The 10-year ASCVD risk score (Arnett DK, et al., 2019) is: 13.9%   Values used to calculate the score:     Age: 28 years     Sex: Female     Is Non-Hispanic African American: No     Diabetic: Yes     Tobacco smoker: No     Systolic Blood Pressure: 102 mmHg     Is BP treated: Yes     HDL Cholesterol: 56 mg/dL     Total Cholesterol: 154 mg/dL       05/28/2021    2:31 PM 05/14/2021    8:23 AM 11/13/2020   10:59 AM  Depression screen PHQ 2/9  Decreased Interest 0 0 0  Down, Depressed, Hopeless 0 0 0  PHQ - 2 Score 0 0 0  Altered sleeping 2 2 0  Tired, decreased energy 0 0 0  Change in appetite 0 0 0  Feeling bad or failure about yourself  0 0 0  Trouble concentrating 0 0 0  Moving slowly or fidgety/restless 0 0 0  Suicidal thoughts 0 0 0  PHQ-9 Score 2 2 0  Difficult doing work/chores   Not difficult at all     Other: (CHADS2VASc if Afib, MMRC or CAT for COPD, ACT, DEXA)  Social History   Tobacco Use  Smoking Status Never  Smokeless Tobacco Never   BP Readings from Last 3 Encounters:  05/28/21 122/74  05/14/21 100/80  02/05/21 110/76   Pulse Readings from Last 3 Encounters:  05/28/21 81  05/14/21 96  02/05/21 72   Wt Readings from Last 3 Encounters:  05/28/21 211 lb 3.2 oz (95.8 kg)  05/14/21 213 lb (96.6 kg)  02/05/21 214 lb (97.1 kg)   BMI Readings from Last 3 Encounters:  05/28/21 34.09 kg/m  05/14/21 34.38 kg/m  02/05/21 34.54 kg/m    Assessment/Interventions: Review of patient past medical history, allergies, medications, health status, including review of consultants reports, laboratory and other test data, was performed as part of comprehensive evaluation and provision of chronic care management services.   SDOH:  (Social Determinants of Health)  assessments and interventions performed: Yes SDOH Interventions    Flowsheet Row Most  Recent Value  SDOH Interventions   Financial Strain Interventions Other (Comment)  [PAP]  Transportation Interventions Intervention Not Indicated      SDOH Screenings   Alcohol Screen: Low Risk    Last Alcohol Screening Score (AUDIT): 1  Depression (PHQ2-9): Low Risk    PHQ-2 Score: 2  Financial Resource Strain: High Risk   Difficulty of Paying Living Expenses: Hard  Food Insecurity: No Food Insecurity   Worried About Charity fundraiser in the Last Year: Never true   Ran Out of Food in the Last Year: Never true  Housing: Low Risk    Last Housing Risk Score: 0  Physical Activity: Insufficiently Active   Days of Exercise per Week: 3 days   Minutes of Exercise per Session: 40 min  Social Connections: Unknown   Frequency of Communication with Friends and Family: Twice a week   Frequency of Social Gatherings with Friends and Family: Patient refused   Attends Religious Services: Not on Higher education careers adviser of Clubs or Organizations: No   Attends Archivist Meetings: Never   Marital Status: Married  Stress: No Stress Concern Present   Feeling of Stress : Only a little  Tobacco Use: Low Risk    Smoking Tobacco Use: Never   Smokeless Tobacco Use: Never   Passive Exposure: Not on file  Transportation Needs: No Transportation Needs   Lack of Transportation (Medical): No   Lack of Transportation (Non-Medical): No    CCM Care Plan  Allergies  Allergen Reactions   Metformin And Related     Diarrhea   Ramipril     Cough   Tuberculin, Ppd     False positive    Medications Reviewed Today     Reviewed by Lane Hacker, [facility] (Pharmacist) on 06/15/21 at 1447  Med List Status: <None>   Medication Order Taking? Sig Documenting Provider Last Dose Status Informant  atenolol-chlorthalidone (TENORETIC) 100-25 MG tablet [id number]  Take 1 tablet by mouth daily. Cox, Kirsten, MD  Active    celecoxib (CELEBREX) 200 MG capsule [id number]  Take 1 capsule (200 mg total) by mouth 2 (two) times daily. Cox, Kirsten, MD  Active   cetirizine (ZYRTEC) 10 MG tablet [id number]  Take 1 tablet (10 mg total) by mouth daily. Cox, Kirsten, MD  Active   clonazePAM (KLONOPIN) 0.5 MG tablet [id number]  Take 1 tablet (0.5 mg total) by mouth daily as needed. Cox, Kirsten, MD  Active   Dulaglutide (TRULICITY) 3 ZD/6.6YQ SOPN [id number] No Inject 3 mg into the skin. Use Victoza Samples until Trulicity PAP is approved  Patient not taking: Reported on 06/15/2021   [provider] Not Taking Consider Medication Status and Discontinue   gabapentin (NEURONTIN) 300 MG capsule [id number]  TAKE ONE CAPSULE BY MOUTH TWICE DAILY Cox, Kirsten, MD  Active   Insulin Pen Needle 32G X 6 MM MISC [id number]  1 each by Does not apply route daily. Use with victoza samples Cox, Kirsten, MD  Active   liraglutide (VICTOZA) 18 MG/3ML SOPN [id number] Yes Inject 1.8 mg into the skin daily. PAP Approved 2023 [provider] Taking Active   losartan (COZAAR) 50 MG tablet [id number]  Take 1 tablet (50 mg total) by mouth daily. Cox, Kirsten, MD  Active   omeprazole (PRILOSEC) 20 MG capsule [id number]  Take 1 capsule (20 mg total) by mouth daily. Cox, Kirsten, MD  Active   potassium chloride (KLOR-CON) 10 MEQ tablet [id number]  Take 1 tablet (10 mEq  total) by mouth once for 1 dose. Rochel Brome, MD  Expired 01/12/21 2359   rosuvastatin (CRESTOR) 5 MG tablet [id number]  Take 1 tablet (5 mg total) by mouth daily. Rochel Brome, MD  Active   Semaglutide, 2 MG/DOSE, 8 MG/3ML SOPN [id number] No Inject 2 mg as directed once a week.  Patient not taking: Reported on 06/15/2021   CoxElnita Maxwell, MD Not Taking Consider Medication Status and Discontinue   Suvorexant (BELSOMRA) 10 MG TABS [id number]  Take 10 mg by mouth at bedtime as needed. Cox, Kirsten, MD  Active   terbinafine (LAMISIL) 250 MG tablet [id number]  Take 1 tablet (250 mg total) by  mouth daily. Rochel Brome, MD  Active             Patient Active Problem List   Diagnosis Date Noted   Nocturia 05/27/2021   Chronic right shoulder pain 05/27/2021   Degenerative disc disease, lumbar 05/27/2021   Insomnia 05/27/2021   GAD (generalized anxiety disorder) 02/14/2021   Type 2 diabetes mellitus with hyperglycemia, without long-term current use of insulin ([place]) 02/14/2021   Onychomycosis 02/14/2021   Acute infection of nasal sinus 02/14/2021   Rash 02/14/2021   Plantar fasciitis 11/14/2020   Class 1 obesity due to excess calories with serious comorbidity and body mass index (BMI) of 34.0 to 34.9 in adult 11/14/2020   Gastroesophageal reflux disease without esophagitis 11/14/2020   Degeneration of lumbar intervertebral disc 05/01/2020   Localized, primary osteoarthritis 05/01/2020   Low back pain 05/01/2020   Lumbar radiculopathy 05/01/2020   Osteoarthritis of knee 05/01/2020   Mixed anxiety and depressive disorder 02/18/2020   Combined hyperlipidemia associated with type 2 diabetes mellitus ([place]) 02/18/2020   Hypertension associated with type 2 diabetes mellitus ([place]) 02/18/2020   Osteoporosis 02/18/2020   Hyperlipidemia 02/18/2020   Foot pain 06/22/2010    Immunization History  Administered Date(s) Administered   Influenza, Quadrivalent, Recombinant, Inj, Pf 10/23/2016   Influenza-Unspecified 11/15/2019, 10/31/2020   Moderna Sars-Covid-2 Vaccination 03/12/2019, 04/14/2019, 11/15/2019, 05/27/2020, 10/31/2020   Pneumococcal Conjugate-13 10/08/2018   Pneumococcal Polysaccharide-23 02/15/2020   Tdap 08/03/2008, 02/15/2013    Conditions to be addressed/monitored:  Hypertension, Hyperlipidemia, Diabetes, and GERD  Care Plan : [place]  Updates made by Lane Hacker, [facility] since 06/15/2021 12:00 AM     Problem: DM, Lipids, Cardio, GERD   Priority: High  Onset Date: 01/01/2021     Long-Range Goal: Disease State Management   Start Date:  01/01/2021  Expected End Date: 01/01/2022  Recent Progress: On track  Priority: High  Note:   Current Barriers:  Does not maintain contact with provider office Does not contact provider office for questions/concerns  Pharmacist Clinical Goal(s):  Patient will contact provider office for questions/concerns as evidenced notation of same in electronic health record through collaboration with PharmD and provider.   Interventions: 1:1 collaboration with Rochel Brome, MD regarding development and update of comprehensive plan of care as evidenced by provider attestation and co-signature Inter-disciplinary care team collaboration (see longitudinal plan of care) Comprehensive medication review performed; medication list updated in electronic medical record  Hypertension (BP goal <140/90) BP Readings from Last 3 Encounters:  12/26/20 110/70  11/13/20 104/62  10/09/20 122/72  -Controlled -Current treatment: Atenolol/Chlorthalidone 100/25 QD Appropriate, Effective, Safe, Accessible Losartan 20m QD Appropriate, Effective, Safe, Accessible Kcl 170m Appropriate, Effective, Safe, Accessible -Medications previously tried: N/A  -Current home readings: Didn't have readings -Current dietary habits: "Tries to eat healthy" -Current exercise habits: Works as  grocer 4x/week for 5 hour shifts Denies hypotensive/hypertensive symptoms -Educated on BP goals and benefits of medications for prevention of heart attack, stroke and kidney damage; -Counseled to monitor BP at home PRN, document, and provide log at future appointments May 2023: Will ask PCP to send in separate Atenolol/Chlorthalidone, this may cause her to hit [facility] sooner  Hyperlipidemia: (LDL goal < 70) The 10-year ASCVD risk score (Arnett DK, et al., 2019) is: 11.2%   Values used to calculate the score:     Age: 40 years     Sex: Female     Is Non-Hispanic African American: No     Diabetic: Yes     Tobacco smoker: No     Systolic Blood  Pressure: 110 mmHg     Is BP treated: Yes     HDL Cholesterol: 48 mg/dL     Total Cholesterol: 131 mg/dL Lab Results  Component Value Date   CHOL 131 10/09/2020   CHOL 154 02/21/2020   Lab Results  Component Value Date   HDL 48 10/09/2020   HDL 58 02/21/2020   Lab Results  Component Value Date   LDLCALC 55 10/09/2020   LDLCALC 73 02/21/2020   Lab Results  Component Value Date   TRIG 169 (H) 10/09/2020   TRIG 131 02/21/2020   Lab Results  Component Value Date   CHOLHDL 2.7 02/21/2020  No results found for: LDLDIRECT -Controlled -Current treatment: Rosuvastatin 32m Appropriate, Effective, Safe, Accessible -Medications previously tried: N/A  -Current dietary patterns: "Tries to eat healthy" -Current exercise habits: Works as gEngineer, site4x/week for 5 hour shifts -Educated on Cholesterol goals;  -Recommended to continue current medication  Diabetes (A1c goal <7%) Lab Results  Component Value Date   HGBA1C 6.4 (H) 10/09/2020   HGBA1C 6.0 (H) 02/21/2020   Lab Results  Component Value Date   LDLCALC 55 10/09/2020   CREATININE 0.91 10/09/2020   Lab Results  Component Value Date   NA 140 10/09/2020   K 3.6 10/09/2020   CREATININE 0.91 10/09/2020   EGFR 69 10/09/2020   GFRNONAA 74 02/21/2020   GLUCOSE 108 (H) 10/09/2020   Lab Results  Component Value Date   WBC 7.9 10/09/2020   HGB 13.4 10/09/2020   HCT 41.8 10/09/2020   MCV 89 10/09/2020   PLT 314 10/09/2020  -Controlled -Current medications: Victoza (PAP approved 2023) Appropriate, Effective, Safe, Accessible -Medications previously tried: Metformin  -Current home glucose readings fasting glucose: Doesn't test post prandial glucose: Doesn't test -Denies hypoglycemic/hyperglycemic symptoms -Current exercise habits: Works as gEngineer, site4x/week for 5 hour shifts -Educated on A1c and blood sugar goals; -Counseled to check feet daily and get yearly eye exams May 2023: [facility] to afford [facility]pic[facility]. -Sent direct msg to Dr. CTobie Poetand LElissa Lovett Patient will use Victoza samples daily (Emphasized to taper up the dose) and we will try to get Trulicity PAP approved (Ozempic PAP's have been denied recently due to shortage). Gave patient my number and recommended she call me a week before samples run out, not the day before -Part time job: $360/2 weeks or $9,360/year SS: $1,469.40/month 17,628/year Total/year: $$31,517 -Husband: $2,482/month or $29,784/year Part Time: $496/2 weeks or $12,896/year Total/year: $42,680  -Total both yearly income: $$61,607 -PAP Max income for house of 2: $78,880 June 2023: PAP approved for Victoza for 2023. F/U November for 2023  GERD (Goal: minimize symptoms of reflux ) -Controlled -Current treatment  Omeprazole 229mAppropriate, Effective, Safe,  Accessible Calcium 659m + VitD3 (262m) Appropriate, Effective, Safe, Accessible -Medications previously tried: none reported  -Triggering factors: Daily occurrence, "Burning in my esophagus if I don't take it" -Hx of Bleeds/ulcers: No -Counseled on small meals, elevating head, and sleeping on left side -Recommended to continue current medication and to separate this and her Women's MVI. 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) -Controlled -Current treatment: Gabepentin 20052mID Appropriate, Effective, Safe, Accessible Celecoxib 200m50mD Appropriate, Effective, Safe, Accessible -Medications previously tried: N/A  -Pain Scale Today's Vitals   01/01/21 1200  PainSc: 5     Aggravating Factors: Daily use  Pain Type: Neuropathy (From Sx) and musc/Skel)  December 2022: Patient told me, "Gabapentin used to work but doesn't as much anymore." Will ask PCP to increase dose   Patient Goals/Self-Care Activities Patient will:  - take medications as prescribed as evidenced by patient report and record review  Follow Up  Plan: The patient has been provided with contact information for the care management team and has been advised to call with any health related questions or concerns.   CPP F/U November 2023  [place] ConstablearSherian Rein- [phone]        Medication Assistance: None required.  Patient affirms current coverage meets needs.  Compliance/Adherence/Medication fill history: Care Gaps: Last annual wellness visit? 05-01-2020 Last eye exam / retinopathy screening? 07-24-2020 Last diabetic foot exam? Patient stated none because Dr. Cox Tobie Poetmally examines feet.     Star Rating Drugs: Ozempic 1 mg- Last filled 12-26-2020 42 DS Rosuvastatin 5 mg- Last filled 12-09-2020 90 DS Losartan 50 mg- Last filled 12-09-2020 90 DS  Patient's preferred pharmacy is:  [facility] [id number] [place]: [phone]: [phone] pill box? No -   Pt endorses 100% compliance  We discussed: Benefits of medication synchronization, packaging and delivery as well as enhanced pharmacist oversight with [facility]. Patient decided to: Utilize [facility] for medication synchronization, packaging and delivery Verbal consent obtained for [facility] enhanced pharmacy services (medication synchronization, adherence packaging, delivery coordination). A medication sync plan was created to allow patient to get all medications delivered once every 30 to 90 days per patient preference. Patient understands they have freedom to choose pharmacy and clinical pharmacist will coordinate care between all prescribers and [facility].   Care Plan and Follow Up Patient Decision:  Patient agrees to Care Plan and Follow-up.  Plan: The patient has been provided with contact information for the care management team and has been advised to call with any health related questions or concerns.   CPP F/U November  2023  [place] ConstablearSherian Rein- [phone]

## 2021-06-15 NOTE — Patient Instructions (Signed)
Visit Information   Goals Addressed   None    Patient Care Plan: CCM Pharmacy Care Plan     Problem Identified: DM, Lipids, Cardio, GERD   Priority: High  Onset Date: 01/01/2021     Long-Range Goal: Disease State Management   Start Date: 01/01/2021  Expected End Date: 01/01/2022  Recent Progress: On track  Priority: High  Note:   Current Barriers:  Does not maintain contact with provider office Does not contact provider office for questions/concerns  Pharmacist Clinical Goal(s):  Patient will contact provider office for questions/concerns as evidenced notation of same in electronic health record through collaboration with PharmD and provider.   Interventions: 1:1 collaboration with Cox, Elnita Maxwell, MD regarding development and update of comprehensive plan of care as evidenced by provider attestation and co-signature Inter-disciplinary care team collaboration (see longitudinal plan of care) Comprehensive medication review performed; medication list updated in electronic medical record  Hypertension (BP goal <140/90) BP Readings from Last 3 Encounters:  12/26/20 110/70  11/13/20 104/62  10/09/20 122/72  -Controlled -Current treatment: Atenolol/Chlorthalidone 100/25 QD Appropriate, Effective, Safe, Accessible Losartan 50mg  QD Appropriate, Effective, Safe, Accessible Kcl 87mEq Appropriate, Effective, Safe, Accessible -Medications previously tried: N/A  -Current home readings: Didn't have readings -Current dietary habits: "Tries to eat healthy" -Current exercise habits: Works as Engineer, site 4x/week for 5 hour shifts Denies hypotensive/hypertensive symptoms -Educated on BP goals and benefits of medications for prevention of heart attack, stroke and kidney damage; -Counseled to monitor BP at home PRN, document, and provide log at future appointments May 2023: Will ask PCP to send in separate Atenolol/Chlorthalidone, this may cause her to hit [facility] sooner  Hyperlipidemia: (LDL goal <  70) The 10-year ASCVD risk score (Arnett DK, et al., 2019) is: 11.2%   Values used to calculate the score:     Age: 66 years     Sex: Female     Is Non-Hispanic African American: No     Diabetic: Yes     Tobacco smoker: No     Systolic Blood Pressure: 102 mmHg     Is BP treated: Yes     HDL Cholesterol: 48 mg/dL     Total Cholesterol: 131 mg/dL Lab Results  Component Value Date   CHOL 131 10/09/2020   CHOL 154 02/21/2020   Lab Results  Component Value Date   HDL 48 10/09/2020   HDL 58 02/21/2020   Lab Results  Component Value Date   LDLCALC 55 10/09/2020   LDLCALC 73 02/21/2020   Lab Results  Component Value Date   TRIG 169 (H) 10/09/2020   TRIG 131 02/21/2020   Lab Results  Component Value Date   CHOLHDL 2.7 02/21/2020  No results found for: LDLDIRECT -Controlled -Current treatment: Rosuvastatin 5mg  Appropriate, Effective, Safe, Accessible -Medications previously tried: N/A  -Current dietary patterns: "Tries to eat healthy" -Current exercise habits: Works as Engineer, site 4x/week for 5 hour shifts -Educated on Cholesterol goals;  -Recommended to continue current medication  Diabetes (A1c goal <7%) Lab Results  Component Value Date   HGBA1C 6.4 (H) 10/09/2020   HGBA1C 6.0 (H) 02/21/2020   Lab Results  Component Value Date   LDLCALC 55 10/09/2020   CREATININE 0.91 10/09/2020   Lab Results  Component Value Date   NA 140 10/09/2020   K 3.6 10/09/2020   CREATININE 0.91 10/09/2020   EGFR 69 10/09/2020   GFRNONAA 74 02/21/2020   GLUCOSE 108 (H) 10/09/2020   Lab Results  Component Value Date   WBC  7.9 10/09/2020   HGB 13.4 10/09/2020   HCT 41.8 10/09/2020   MCV 89 10/09/2020   PLT 314 10/09/2020  -Controlled -Current medications: Victoza (PAP approved 2023) Appropriate, Effective, Safe, Accessible -Medications previously tried: Metformin  -Current home glucose readings fasting glucose: Doesn't test post prandial glucose: Doesn't test -Denies  hypoglycemic/hyperglycemic symptoms -Current exercise habits: Works as Engineer, site 4x/week for 5 hour shifts -Educated on A1c and blood sugar goals; -Counseled to check feet daily and get yearly eye exams May 2023: Unable to afford Ozempic, hit [facility] and will cost $200 05/18/21. -Sent direct msg to Dr. Tobie Poet and Elissa Lovett. Patient will use Victoza samples daily (Emphasized to taper up the dose) and we will try to get Trulicity PAP approved (Ozempic PAP's have been denied recently due to shortage). Gave patient my number and recommended she call me a week before samples run out, not the day before -Part time job: $360/2 weeks or $9,360/year SS: $1,469.40/month 17,628/year Total/year: $92,330  -Husband: $2,482/month or $29,784/year Part Time: $496/2 weeks or $12,896/year Total/year: $42,680  -Total both yearly income: $07,622  -PAP Max income for house of 2: $78,880 June 2023: PAP approved for Victoza for 2023. F/U November for 2023  GERD (Goal: minimize symptoms of reflux ) -Controlled -Current treatment  Omeprazole 20mg  Appropriate, Effective, Safe, Accessible Calcium 600mg  + VitD3 (5mcg) Appropriate, Effective, Safe, Accessible -Medications previously tried: none reported  -Triggering factors: Daily occurrence, "Burning in my esophagus if I don't take it" -Hx of Bleeds/ulcers: No -Counseled on small meals, elevating head, and sleeping on left side -Recommended to continue current medication and to separate this and her Women's MVI. 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) -Controlled -Current treatment: Gabepentin 200mg  BID Appropriate, Effective, Safe, Accessible Celecoxib 200mg  BID Appropriate, Effective, Safe, Accessible -Medications previously tried: N/A  -Pain Scale Today's Vitals   01/01/21 1200  PainSc: 5     Aggravating Factors: Daily use  Pain Type: Neuropathy (From Sx) and musc/Skel)  December  2022: Patient told me, "Gabapentin used to work but doesn't as much anymore." Will ask PCP to increase dose   Patient Goals/Self-Care Activities Patient will:  - take medications as prescribed as evidenced by patient report and record review  Follow Up Plan: The patient has been provided with contact information for the care management team and has been advised to call with any health related questions or concerns.   CPP F/U November 2023  [place] Constable, Sherian Rein.D. - [phone]       Anna Greene was given information about Chronic Care Management services today including:  CCM service includes personalized support from designated clinical staff supervised by her physician, including individualized plan of care and coordination with other care providers 24/7 contact phone numbers for assistance for urgent and routine care needs. Standard insurance, coinsurance, copays and deductibles apply for chronic care management only during months in which we provide at least 20 minutes of these services. Most insurances cover these services at 100%, however patients may be responsible for any copay, coinsurance and/or deductible if applicable. This service may help you avoid the need for more expensive face-to-face services. Only one practitioner may furnish and bill the service in a calendar month. The patient may stop CCM services at any time (effective at the end of the month) by phone call to the office staff.  Patient agreed to services and verbal consent obtained.   The patient verbalized understanding of  instructions, educational materials, and care plan provided today and DECLINED offer to receive copy of patient instructions, educational materials, and care plan.  The pharmacy team will reach out to the patient again over the next 60 days.   Anna Greene, [place]

## 2021-06-18 ENCOUNTER — Ambulatory Visit: Payer: Medicare HMO | Admitting: Family Medicine

## 2021-07-02 ENCOUNTER — Encounter: Payer: Self-pay | Admitting: Family Medicine

## 2021-07-02 ENCOUNTER — Ambulatory Visit (INDEPENDENT_AMBULATORY_CARE_PROVIDER_SITE_OTHER): Payer: Medicare HMO | Admitting: Family Medicine

## 2021-07-02 VITALS — BP 124/76 | HR 76 | Temp 97.1°F | Resp 16 | Ht 66.0 in | Wt 210.0 lb

## 2021-07-02 DIAGNOSIS — M791 Myalgia, unspecified site: Secondary | ICD-10-CM

## 2021-07-02 MED ORDER — TIZANIDINE HCL 4 MG PO TABS
4.0000 mg | ORAL_TABLET | Freq: Three times a day (TID) | ORAL | 2 refills | Status: DC | PRN
Start: 2021-07-02 — End: 2021-07-18

## 2021-07-02 MED ORDER — AMOXICILLIN 500 MG PO CAPS: ORAL_CAPSULE | ORAL | 3 refills | Status: DC

## 2021-07-02 NOTE — Progress Notes (Addendum)
Subjective:  Patient ID: Anna Greene, female    DOB: 1955/04/17  Age: 66 y.o. MRN: [id number]  Chief Complaint  Patient presents with   myalgia    HPI Patient is complaining of muscle cramps.  She had them for years.  It started after her total knee replacement on her right knee.  She gets muscle cramps in her right thigh but now it is also spread to her left thigh, calves and feet.  Patient has been on gabapentin for years but she does not feel like this is helping anymore.  She takes 300 mg twice daily.  She has never tried a muscle relaxant before she has tried pickle juice and yellow mustard which does not help her.  She is also on supplemental magnesium and calcium with vitamin D.  She does not describe it as a restless leg syndrome.  Nor does she have neuropathy.  Current Outpatient Medications on File Prior to Visit  Medication Sig Dispense Refill   atenolol-chlorthalidone (TENORETIC) 100-25 MG tablet Take 1 tablet by mouth daily. 90 tablet 1   celecoxib (CELEBREX) 200 MG capsule Take 1 capsule (200 mg total) by mouth 2 (two) times daily. 180 capsule 0   cetirizine (ZYRTEC) 10 MG tablet Take 1 tablet (10 mg total) by mouth daily. 90 tablet 3   clonazePAM (KLONOPIN) 0.5 MG tablet Take 1 tablet (0.5 mg total) by mouth daily as needed. 30 tablet 2   gabapentin (NEURONTIN) 300 MG capsule TAKE ONE CAPSULE BY MOUTH TWICE DAILY 180 capsule 0   Insulin Pen Needle 32G X 6 MM MISC 1 each by Does not apply route daily. Use with victoza samples 30 each 2   liraglutide (VICTOZA) 18 MG/3ML SOPN Inject 1.8 mg into the skin daily. PAP Approved 2023     losartan (COZAAR) 50 MG tablet Take 1 tablet (50 mg total) by mouth daily. 90 tablet 1   omeprazole (PRILOSEC) 20 MG capsule Take 1 capsule (20 mg total) by mouth daily. 90 capsule 1   potassium chloride (KLOR-CON) 10 MEQ tablet Take 1 tablet (10 mEq total) by mouth once for 1 dose. 90 tablet 1   rosuvastatin (CRESTOR) 5 MG tablet Take 1 tablet (5 mg  total) by mouth daily. 90 tablet 1   terbinafine (LAMISIL) 250 MG tablet Take 1 tablet (250 mg total) by mouth daily. 90 tablet 1   No current facility-administered medications on file prior to visit.   Past Medical History:  Diagnosis Date   Benign fasciculation-cramp syndrome    Depression    Diabetes mellitus without complication (HCC)    GERD (gastroesophageal reflux disease)    Hyperlipidemia    Hypertension    Nephrolithiasis    Osteoarthritis    Bilateral knees, degenerative disc disease.  Patient has had epidural steroid injections x2   Plantar fasciitis    Past Surgical History:  Procedure Laterality Date   BREAST REDUCTION SURGERY Bilateral 1996   CESAREAN SECTION  1976   CESAREAN SECTION  1979   KNEE ARTHROSCOPY W/ MENISCAL REPAIR Left 1995   Per patient muscle was repaired   KNEE ARTHROSCOPY W/ MENISCAL REPAIR Right 2003   knee surgery Left 10/2020   meniscal repair via arthroscopy   PANNICULECTOMY     REDUCTION MAMMAPLASTY  1996   REPLACEMENT TOTAL KNEE Right 2019   TONSILLECTOMY  1960    Family History  Problem Relation Age of Onset   Stroke Mother    Heart disease Mother  Cancer Father    Diabetes Sister    Anxiety disorder Sister    Depression Sister    Diabetes Sister    Diabetes Sister    Arthritis Sister    Stroke Brother    Breast cancer Neg Hx    Social History   Socioeconomic History   Marital status: Married    Spouse name: Ronnie   Number of children: 2   Years of education: Not on file   Highest education level: Not on file  Occupational History   Occupation: TEFL teacher   Occupation: Retired Teacher, music  Tobacco Use   Smoking status: Never   Smokeless tobacco: Never  Building services engineer Use: Never used  Substance and Sexual Activity   Alcohol use: Never   Drug use: Never   Sexual activity: Yes    Partners: Male  Other Topics Concern   Not on file  Social History Narrative   Patient is married.  Has 2 adult  children that live in [place] (where she is from)   Social Determinants of Health   Financial Resource Strain: High Risk (06/15/2021)   Overall Financial Resource Strain (CARDIA)    Difficulty of Paying Living Expenses: Hard  Food Insecurity: No Food Insecurity (05/24/2021)   Hunger Vital Sign    Worried About Running Out of Food in the Last Year: Never true    Ran Out of Food in the Last Year: Never true  Transportation Needs: No Transportation Needs (06/15/2021)   PRAPARE - Administrator, Civil Service (Medical): No    Lack of Transportation (Non-Medical): No  Physical Activity: Insufficiently Active (05/24/2021)   Exercise Vital Sign    Days of Exercise per Week: 3 days    Minutes of Exercise per Session: 40 min  Stress: No Stress Concern Present (05/24/2021)   [employer] of Occupational Health - Occupational Stress Questionnaire    Feeling of Stress : Only a little  Social Connections: Unknown (05/24/2021)   Social Connection and Isolation Panel [NHANES]    Frequency of Communication with Friends and Family: Twice a week    Frequency of Social Gatherings with Friends and Family: Patient refused    Attends Religious Services: Not on Marketing executive or Organizations: No    Attends Banker Meetings: Never    Marital Status: Married    Review of Systems  Constitutional:  Negative for chills, fatigue and fever.  Cardiovascular:  Negative for chest pain.  Gastrointestinal:  Negative for abdominal pain, constipation, diarrhea, nausea and vomiting.  Musculoskeletal:  Positive for myalgias. Negative for back pain.  Neurological:  Negative for weakness, light-headedness and headaches.  Psychiatric/Behavioral:  Negative for dysphoric mood. The patient is not nervous/anxious.      Objective:  BP 124/76   Pulse 76   Temp (!) 97.1 F (36.2 C)   Resp 16   Ht 5\' 6"  (1.676 m)   Wt 210 lb (95.3 kg)   LMP  (LMP Unknown)   BMI 33.89 kg/m       07/02/2021    1:23 PM 05/28/2021    1:52 PM 05/14/2021    8:16 AM  BP/Weight  Systolic BP 124 122 100  Diastolic BP 76 74 80  Wt. (Lbs) 210 211.2 213  BMI 33.89 kg/m2 34.09 kg/m2 34.38 kg/m2    Physical Exam Vitals reviewed.  Constitutional:      Appearance: Normal appearance. She is obese.  Cardiovascular:  Rate and Rhythm: Normal rate and regular rhythm.     Heart sounds: Normal heart sounds.  Pulmonary:     Effort: Pulmonary effort is normal. No respiratory distress.     Breath sounds: Normal breath sounds.  Abdominal:     General: Abdomen is flat. Bowel sounds are normal.     Palpations: Abdomen is soft.     Tenderness: There is no abdominal tenderness.  Neurological:     Mental Status: She is alert and oriented to person, place, and time.  Psychiatric:        Mood and Affect: Mood normal.        Behavior: Behavior normal.     Diabetic Foot Exam - Simple   No data filed      Lab Results  Component Value Date   WBC 7.7 05/07/2021   HGB 13.9 05/07/2021   HCT 42.5 05/07/2021   PLT 292 05/07/2021   GLUCOSE 99 05/07/2021   CHOL 154 05/07/2021   TRIG 154 (H) 05/07/2021   HDL 56 05/07/2021   LDLCALC 72 05/07/2021   ALT 21 05/07/2021   AST 23 05/07/2021   NA 139 05/07/2021   K 4.5 05/07/2021   CL 98 05/07/2021   CREATININE 0.97 05/07/2021   BUN 13 05/07/2021   CO2 29 05/07/2021   TSH 0.948 02/21/2020   HGBA1C 5.9 (H) 05/07/2021      Assessment & Plan:   Problem List Items Addressed This Visit       Other   Myalgia - Primary    Start tizanidine 4 mg nightly.  May take up to 2 at night if needed. In 1 week decrease gabapentin 300 mg at night x 1 week, the discontinue.  If symptoms worsen during any of this, patient should call for new recommendations.      Relevant Orders   Magnesium   Phosphorus   VITAMIN D 25 Hydroxy (Vit-D Deficiency, Fractures)  .  Meds ordered this encounter  Medications   tiZANidine (ZANAFLEX) 4 MG tablet     Sig: Take 1 tablet (4 mg total) by mouth every 8 (eight) hours as needed for muscle spasms.    Dispense:  60 tablet    Refill:  2   amoxicillin (AMOXIL) 500 MG capsule    Sig: 4 capsules one hour prior to dental procedures.    Dispense:  4 capsule    Refill:  3    Orders Placed This Encounter  Procedures   Magnesium   Phosphorus   VITAMIN D 25 Hydroxy (Vit-D Deficiency, Fractures)     Follow-up: Return for Previously scheduled appt..  An After Visit Summary was printed and given to the patient.  Blane Ohara, MD [facility] [phone]

## 2021-07-02 NOTE — Patient Instructions (Signed)
Start tizanidine 4 mg nightly.  May take up to 2 at night if needed. In 1 week decrease gabapentin 300 mg at night x 1 week, the discontinue.

## 2021-07-02 NOTE — Assessment & Plan Note (Signed)
Start tizanidine 4 mg nightly.  May take up to 2 at night if needed. In 1 week decrease gabapentin 300 mg at night x 1 week, the discontinue.  If symptoms worsen during any of this, patient should call for new recommendations.

## 2021-07-03 LAB — PHOSPHORUS: Phosphorus: 3.2 mg/dL (ref 3.0–4.3)

## 2021-07-03 LAB — VITAMIN D 25 HYDROXY (VIT D DEFICIENCY, FRACTURES): Vit D, 25-Hydroxy: 50.7 ng/mL (ref 30.0–100.0)

## 2021-07-03 LAB — MAGNESIUM: Magnesium: 1.9 mg/dL (ref 1.6–2.3)

## 2021-07-10 ENCOUNTER — Other Ambulatory Visit: Payer: Self-pay

## 2021-07-10 ENCOUNTER — Telehealth: Payer: Self-pay

## 2021-07-10 DIAGNOSIS — M7731 Calcaneal spur, right foot: Secondary | ICD-10-CM | POA: Diagnosis not present

## 2021-07-10 DIAGNOSIS — E1159 Type 2 diabetes mellitus with other circulatory complications: Secondary | ICD-10-CM

## 2021-07-10 DIAGNOSIS — M79671 Pain in right foot: Secondary | ICD-10-CM | POA: Diagnosis not present

## 2021-07-10 DIAGNOSIS — Z0389 Encounter for observation for other suspected diseases and conditions ruled out: Secondary | ICD-10-CM | POA: Diagnosis not present

## 2021-07-10 MED ORDER — ATENOLOL-CHLORTHALIDONE 100-25 MG PO TABS: 1.0000 | ORAL_TABLET | Freq: Every day | ORAL | 1 refills | Status: DC

## 2021-07-10 NOTE — Progress Notes (Signed)
Chronic Care Management Pharmacy Assistant   Name: Anna Greene  MRN: [id number] DOB: 12/31/1955   Reason for Encounter: Medication Coordination for Upstream   Recent office visits:  07/02/21 Blane Ohara MD. Seen for Myalgia. Started on Amoxicillin 500mg  and Tizanidine 4mg . D/C Dulaglutide 3mg , Semaglutide 2mg  and Suvorexant 10mg . In 1 week decrease gabapentin 300 mg at night x 1 week, the discontinue.   Recent consult visits:  None  Hospital visits:  None  Medications: Outpatient Encounter Medications as of 07/10/2021  Medication Sig   amoxicillin (AMOXIL) 500 MG capsule 4 capsules one hour prior to dental procedures.   atenolol-chlorthalidone (TENORETIC) 100-25 MG tablet Take 1 tablet by mouth daily.   celecoxib (CELEBREX) 200 MG capsule Take 1 capsule (200 mg total) by mouth 2 (two) times daily.   cetirizine (ZYRTEC) 10 MG tablet Take 1 tablet (10 mg total) by mouth daily.   clonazePAM (KLONOPIN) 0.5 MG tablet Take 1 tablet (0.5 mg total) by mouth daily as needed.   gabapentin (NEURONTIN) 300 MG capsule TAKE ONE CAPSULE BY MOUTH TWICE DAILY   Insulin Pen Needle 32G X 6 MM MISC 1 each by Does not apply route daily. Use with victoza samples   liraglutide (VICTOZA) 18 MG/3ML SOPN Inject 1.8 mg into the skin daily. PAP Approved 2023   losartan (COZAAR) 50 MG tablet Take 1 tablet (50 mg total) by mouth daily.   omeprazole (PRILOSEC) 20 MG capsule Take 1 capsule (20 mg total) by mouth daily.   potassium chloride (KLOR-CON) 10 MEQ tablet Take 1 tablet (10 mEq total) by mouth once for 1 dose.   rosuvastatin (CRESTOR) 5 MG tablet Take 1 tablet (5 mg total) by mouth daily.   terbinafine (LAMISIL) 250 MG tablet Take 1 tablet (250 mg total) by mouth daily.   tiZANidine (ZANAFLEX) 4 MG tablet Take 1 tablet (4 mg total) by mouth every 8 (eight) hours as needed for muscle spasms.   No facility-administered encounter medications on file as of 07/10/2021.    Reviewed chart for medication  changes ahead of medication coordination call.  No Consults, or hospital visits since last care coordination call/Pharmacist visit.   BP Readings from Last 3 Encounters:  07/02/21 124/76  05/28/21 122/74  05/14/21 100/80    Lab Results  Component Value Date   HGBA1C 5.9 (H) 05/07/2021     Patient obtains medications through Adherence Packaging  30 Days   Last adherence delivery included:  Potassium Chloride Er 07/12/2021 1 at Breakfast Losartan 50mg  1 at breakfast Atenolol-Chlorthalidone 100-25mg  1 at breakfast Celecoxib 200mg  1 at breakfast and 1 at evening meal Gabapentin 300mg  1 at breakfast and 1 at evening meal Omeprazole 20mg  1 at breakfast Clonazepam 0.5mg  1 tab once daily prn (Bottles)  Rosuvastatin 5mg  1 at evening meal Cetirizine 10mg  1 at evening meal Calcium/Vitamin D3 07/04/21 1 at breakfast and 1 at evening meal Terbinafine HCI 250mg  1 at breakfast   Patient declined (meds) last month  pen needles not due until 08/26/21 Victoza 18mg - PAP, receiving samples  Trulicity 3mg -Not taking- is taking Victoza for now  Ozempic 1mg - Not taking , is taking Victoza for now    Patient is due for next adherence delivery on: 07/18/21. Called patient and reviewed medications and coordinated delivery.  This delivery to include: Potassium Chloride Er 05/09/2021 1 at Breakfast Losartan 50mg  1 at breakfast Atenolol-Chlorthalidone 100-25mg  1 at breakfast Celecoxib 200mg  1 at breakfast and 1 at evening meal Omeprazole 20mg  1 at breakfast Clonazepam 0.5mg   1 tab once daily prn (Bottles)  Rosuvastatin 5mg  1 at evening meal Cetirizine 10mg  1 at evening meal Calcium/Vitamin D3 1 at breakfast and 1 at evening meal Terbinafine HCI 250mg  1 at breakfast   Patient declined the following medications  Gabapentin 300mg -Provider D/C   Patient needs refills -Request Sent  Atenolol-Chlorthalidone 100-25mg    Confirmed delivery date of 07/18/21, advised patient that pharmacy will contact them the  morning of delivery.   , CMA Clinical Pharmacist Assistant  [phone]

## 2021-07-11 NOTE — Telephone Encounter (Signed)
DC Gabapentin per 07/02/21 note

## 2021-07-13 DIAGNOSIS — Z7984 Long term (current) use of oral hypoglycemic drugs: Secondary | ICD-10-CM | POA: Diagnosis not present

## 2021-07-13 DIAGNOSIS — E1159 Type 2 diabetes mellitus with other circulatory complications: Secondary | ICD-10-CM

## 2021-07-13 DIAGNOSIS — E785 Hyperlipidemia, unspecified: Secondary | ICD-10-CM | POA: Diagnosis not present

## 2021-07-13 DIAGNOSIS — I10 Essential (primary) hypertension: Secondary | ICD-10-CM | POA: Diagnosis not present

## 2021-07-18 ENCOUNTER — Telehealth: Payer: Self-pay

## 2021-07-18 ENCOUNTER — Other Ambulatory Visit: Payer: Self-pay | Admitting: Family Medicine

## 2021-07-18 MED ORDER — TIZANIDINE HCL 4 MG PO TABS: 4.0000 mg | ORAL_TABLET | Freq: Three times a day (TID) | ORAL | 0 refills | Status: DC | PRN

## 2021-07-18 NOTE — Telephone Encounter (Signed)
I sent 90-day prescription. Dr Sedalia Muta

## 2021-07-18 NOTE — Telephone Encounter (Signed)
Anna Greene called @ 4:42 on July 3 and left a message that she does want to continue the xanaflex.  She has been taking this and her hyland's leg cramp medication with good results.  She has been able to sleep at night with the combination of this medication.

## 2021-08-09 ENCOUNTER — Telehealth: Payer: Self-pay

## 2021-08-09 NOTE — Progress Notes (Signed)
Chronic Care Management Pharmacy Assistant   Name: Anna Greene  MRN: [id number] DOB: 12-21-1955   Reason for Encounter: Medication Coordination for Upstream    Recent office visits:  None  Recent consult visits:  None  Hospital visits:  None  Medications: Outpatient Encounter Medications as of 08/09/2021  Medication Sig   amoxicillin (AMOXIL) 500 MG capsule 4 capsules one hour prior to dental procedures.   atenolol-chlorthalidone (TENORETIC) 100-25 MG tablet Take 1 tablet by mouth daily.   celecoxib (CELEBREX) 200 MG capsule Take 1 capsule (200 mg total) by mouth 2 (two) times daily.   cetirizine (ZYRTEC) 10 MG tablet Take 1 tablet (10 mg total) by mouth daily.   clonazePAM (KLONOPIN) 0.5 MG tablet Take 1 tablet (0.5 mg total) by mouth daily as needed.   gabapentin (NEURONTIN) 300 MG capsule TAKE ONE CAPSULE BY MOUTH TWICE DAILY (Patient not taking: Reported on 07/11/2021)   Insulin Pen Needle 32G X 6 MM MISC 1 each by Does not apply route daily. Use with victoza samples   liraglutide (VICTOZA) 18 MG/3ML SOPN Inject 1.8 mg into the skin daily. PAP Approved 2023   losartan (COZAAR) 50 MG tablet Take 1 tablet (50 mg total) by mouth daily.   omeprazole (PRILOSEC) 20 MG capsule Take 1 capsule (20 mg total) by mouth daily.   potassium chloride (KLOR-CON) 10 MEQ tablet Take 1 tablet (10 mEq total) by mouth once for 1 dose.   rosuvastatin (CRESTOR) 5 MG tablet Take 1 tablet (5 mg total) by mouth daily.   terbinafine (LAMISIL) 250 MG tablet Take 1 tablet (250 mg total) by mouth daily.   tiZANidine (ZANAFLEX) 4 MG tablet Take 1 tablet (4 mg total) by mouth every 8 (eight) hours as needed for muscle spasms.   No facility-administered encounter medications on file as of 08/09/2021.    Reviewed chart for medication changes ahead of medication coordination call.  No OVs, Consults, or hospital visits since last care coordination call/Pharmacist visit.   No medication changes indicated OR  if recent visit, treatment plan here.  BP Readings from Last 3 Encounters:  07/02/21 124/76  05/28/21 122/74  05/14/21 100/80    Lab Results  Component Value Date   HGBA1C 5.9 (H) 05/07/2021     Patient obtains medications through Adherence Packaging  30 Days   Last adherence delivery included:  Potassium Chloride Er 1 at Breakfast Losartan 50mg  1 at breakfast Atenolol-Chlorthalidone 100-25mg  1 at breakfast Celecoxib 200mg  1 at breakfast and 1 at evening meal Omeprazole 20mg  1 at breakfast Clonazepam 0.5mg  1 tab once daily prn (Bottles)  Rosuvastatin 5mg  1 at evening meal Cetirizine 10mg  1 at evening meal Calcium/Vitamin D3 1 at breakfast and 1 at evening meal Terbinafine HCI 250mg  1 at breakfast   Patient declined (meds) last month Gabapentin 300mg -Provider D/C   Patient is due for next adherence delivery on: 08/21/21. Called patient and reviewed medications and coordinated delivery.  This delivery to include: Potassium Chloride Er 1 at Breakfast Losartan 50mg  1 at breakfast Atenolol-Chlorthalidone 100-25mg  1 at breakfast Celecoxib 200mg  1 at breakfast and 1 at evening meal Omeprazole 20mg  1 at breakfast Clonazepam 0.5mg  1 tab once daily prn (Bottles)  Rosuvastatin 5mg  1 at evening meal Cetirizine 10mg  1 at evening meal Calcium/Vitamin D3 1 at breakfast and 1 at evening meal Terbinafine HCI 250mg  1 at breakfast  Tizanidine 4mg -1 tab every 8 hours as needed  Pen Needles 32g-use once daily   Patient declined the  following medications  Victoza 18mg /1ml- Receives medication samples through office   Patient needs refills -Request Sent  Clonazepam 0.5mg   Losartan 50mg   Celecoxib 200mg   Confirmed delivery date of 08/21/21, advised patient that pharmacy will contact them the morning of delivery.   , CMA Clinical Pharmacist Assistant  [phone]

## 2021-08-10 ENCOUNTER — Other Ambulatory Visit: Payer: Self-pay

## 2021-08-10 DIAGNOSIS — I152 Hypertension secondary to endocrine disorders: Secondary | ICD-10-CM

## 2021-08-10 DIAGNOSIS — M5136 Other intervertebral disc degeneration, lumbar region: Secondary | ICD-10-CM

## 2021-08-10 DIAGNOSIS — F411 Generalized anxiety disorder: Secondary | ICD-10-CM

## 2021-08-10 MED ORDER — CELECOXIB 200 MG PO CAPS: 200.0000 mg | ORAL_CAPSULE | Freq: Two times a day (BID) | ORAL | 0 refills | Status: DC

## 2021-08-10 MED ORDER — LOSARTAN POTASSIUM 50 MG PO TABS: 50.0000 mg | ORAL_TABLET | Freq: Every day | ORAL | 0 refills | Status: DC

## 2021-08-10 MED ORDER — CLONAZEPAM 0.5 MG PO TABS: 0.5000 mg | ORAL_TABLET | Freq: Every day | ORAL | 0 refills | Status: DC | PRN

## 2021-08-21 DIAGNOSIS — R69 Illness, unspecified: Secondary | ICD-10-CM | POA: Diagnosis not present

## 2021-08-24 ENCOUNTER — Telehealth: Payer: Self-pay

## 2021-08-30 NOTE — Chronic Care Management (AMB) (Signed)
[employer]ovo [employer] patient assistance program notification:  120- day supply of Victoza 6mg /ml will be filled on 09/04/2021 and should arrive to the office in 10-14 business days.    10-09-1980, CMA Clinical Pharmacist Assistant [phone]

## 2021-09-06 ENCOUNTER — Other Ambulatory Visit: Payer: Self-pay | Admitting: Family Medicine

## 2021-09-06 DIAGNOSIS — K219 Gastro-esophageal reflux disease without esophagitis: Secondary | ICD-10-CM

## 2021-09-06 DIAGNOSIS — I152 Hypertension secondary to endocrine disorders: Secondary | ICD-10-CM

## 2021-09-11 ENCOUNTER — Telehealth: Payer: Self-pay

## 2021-09-11 NOTE — Progress Notes (Unsigned)
Chronic Care Management Pharmacy Assistant   Name: Anna Greene  MRN: [id number] DOB: November 26, 1955   Reason for Encounter: Medication Coordination for Upstream    Recent office visits:  None  Recent consult visits:  None  Hospital visits:  None  Medications: Outpatient Encounter Medications as of 09/11/2021  Medication Sig   amoxicillin (AMOXIL) 500 MG capsule 4 capsules one hour prior to dental procedures.   atenolol-chlorthalidone (TENORETIC) 100-25 MG tablet Take 1 tablet by mouth daily.   celecoxib (CELEBREX) 200 MG capsule Take 1 capsule (200 mg total) by mouth 2 (two) times daily.   cetirizine (ZYRTEC) 10 MG tablet Take 1 tablet (10 mg total) by mouth daily.   clonazePAM (KLONOPIN) 0.5 MG tablet Take 1 tablet (0.5 mg total) by mouth daily as needed.   gabapentin (NEURONTIN) 300 MG capsule TAKE ONE CAPSULE BY MOUTH TWICE DAILY (Patient not taking: Reported on 07/11/2021)   Insulin Pen Needle 32G X 6 MM MISC 1 each by Does not apply route daily. Use with victoza samples   liraglutide (VICTOZA) 18 MG/3ML SOPN Inject 1.8 mg into the skin daily. PAP Approved 2023   losartan (COZAAR) 50 MG tablet Take 1 tablet (50 mg total) by mouth daily.   omeprazole (PRILOSEC) 20 MG capsule TAKE ONE CAPSULE BY MOUTH EVERY MORNING   potassium chloride (KLOR-CON) 10 MEQ tablet TAKE ONE TABLET BY MOUTH EVERY MORNING   rosuvastatin (CRESTOR) 5 MG tablet Take 1 tablet (5 mg total) by mouth daily.   terbinafine (LAMISIL) 250 MG tablet Take 1 tablet (250 mg total) by mouth daily.   tiZANidine (ZANAFLEX) 4 MG tablet Take 1 tablet (4 mg total) by mouth every 8 (eight) hours as needed for muscle spasms.   No facility-administered encounter medications on file as of 09/11/2021.    Reviewed chart for medication changes ahead of medication coordination call.  No OVs, Consults, or hospital visits since last care coordination call/Pharmacist visit.   No medication changes indicated OR if recent visit,  treatment plan here.  BP Readings from Last 3 Encounters:  07/02/21 124/76  05/28/21 122/74  05/14/21 100/80   / Lab Results  Component Value Date   HGBA1C 5.9 (H) 05/07/2021     Patient obtains medications through Adherence Packaging  30 Days   Last adherence delivery included:  Potassium Chloride Er 1 at Breakfast Losartan 50mg  1 at breakfast Atenolol-Chlorthalidone 100-25mg  1 at breakfast Celecoxib 200mg  1 at breakfast and 1 at evening meal Omeprazole 20mg  1 at breakfast Clonazepam 0.5mg  1 tab once daily prn (Bottles)  Rosuvastatin 5mg  1 at evening meal Cetirizine 10mg  1 at evening meal Calcium/Vitamin D3 1 at breakfast and 1 at evening meal Terbinafine HCI 250mg  1 at breakfast  Tizanidine 4mg -1 tab every 8 hours as needed          Pen Needles 32g-use once daily          Patient declined (meds) last month  Victoza 18mg /37ml- Receives medication samples through office   Patient is due for next adherence delivery on: 09/19/21. Called patient and reviewed medications and coordinated delivery.  This delivery to include: Potassium Chloride Er 1 at Breakfast Losartan 50mg  1 at breakfast Atenolol-Chlorthalidone 100-25mg  1 at breakfast Celecoxib 200mg  1 at breakfast and 1 at evening meal Omeprazole 20mg  1 at breakfast Clonazepam 0.5mg  1 tab once daily prn (Bottles)  Rosuvastatin 5mg  1 at evening meal Cetirizine 10mg  1 at evening meal Calcium/Vitamin D3 600-800 1 at breakfast and 1 at  evening meal Terbinafine HCI 250mg  1 at breakfast  Tizanidine 4mg -1 tab every 8 hours as needed                Patient declined the following medications  pen needles not due until 11/15/21 Victoza 18mg /10ml- Receives medication samples through office   Patient needs refills  Clonazepam 0.5mg    Confirmed delivery date of ***, advised patient that pharmacy will contact them the morning of delivery.   13/2/23, CMA Clinical Pharmacist Assistant  [phone]

## 2021-09-13 ENCOUNTER — Other Ambulatory Visit: Payer: Self-pay

## 2021-09-13 DIAGNOSIS — F411 Generalized anxiety disorder: Secondary | ICD-10-CM

## 2021-09-13 MED ORDER — CLONAZEPAM 0.5 MG PO TABS
0.5000 mg | ORAL_TABLET | Freq: Every day | ORAL | 0 refills | Status: DC | PRN
Start: 2021-09-13 — End: 2021-10-01

## 2021-09-18 ENCOUNTER — Telehealth: Payer: Self-pay

## 2021-09-18 NOTE — Chronic Care Management (AMB) (Signed)
Chronic Care Management Pharmacy Assistant   Name: Kamelia Lampkins  MRN: [id number] DOB: April 17, 1955  Reason for Encounter: Disease State/ General   Recent office visits:  None  Recent consult visits:  None  Hospital visits:  None in previous 6 months  Medications: Outpatient Encounter Medications as of 09/18/2021  Medication Sig   amoxicillin (AMOXIL) 500 MG capsule 4 capsules one hour prior to dental procedures.   atenolol-chlorthalidone (TENORETIC) 100-25 MG tablet Take 1 tablet by mouth daily.   celecoxib (CELEBREX) 200 MG capsule Take 1 capsule (200 mg total) by mouth 2 (two) times daily.   cetirizine (ZYRTEC) 10 MG tablet Take 1 tablet (10 mg total) by mouth daily.   clonazePAM (KLONOPIN) 0.5 MG tablet Take 1 tablet (0.5 mg total) by mouth daily as needed.   gabapentin (NEURONTIN) 300 MG capsule TAKE ONE CAPSULE BY MOUTH TWICE DAILY (Patient not taking: Reported on 07/11/2021)   Insulin Pen Needle 32G X 6 MM MISC 1 each by Does not apply route daily. Use with victoza samples   liraglutide (VICTOZA) 18 MG/3ML SOPN Inject 1.8 mg into the skin daily. PAP Approved 2023   losartan (COZAAR) 50 MG tablet Take 1 tablet (50 mg total) by mouth daily.   omeprazole (PRILOSEC) 20 MG capsule TAKE ONE CAPSULE BY MOUTH EVERY MORNING   potassium chloride (KLOR-CON) 10 MEQ tablet TAKE ONE TABLET BY MOUTH EVERY MORNING   rosuvastatin (CRESTOR) 5 MG tablet Take 1 tablet (5 mg total) by mouth daily.   terbinafine (LAMISIL) 250 MG tablet Take 1 tablet (250 mg total) by mouth daily.   tiZANidine (ZANAFLEX) 4 MG tablet Take 1 tablet (4 mg total) by mouth every 8 (eight) hours as needed for muscle spasms.   No facility-administered encounter medications on file as of 09/18/2021.  Contacted [name] for General Review Call   Chart Review:  Have there been any documented new, changed, or discontinued medications since last visit? No  Has there been any documented recent hospitalizations or ED visits  since last visit with Clinical Pharmacist? No Brief Summary (including medication and/or Diagnosis changes): None   Adherence Review:  Does the Clinical Pharmacist Assistant have access to adherence rates? Yes Adherence rates for STAR metric medications (Review star ratings below). Adherence rates for medications indicated for disease state being reviewed (List medication(s)/day supply/ last 2 fill dates). Does the patient have >5 day gap between last estimated fill dates for any of the above medications or other medication gaps? No Reason for medication gaps: None   Disease State Questions:  Able to connect with Patient? Yes  Did patient have any problems with their health recently? No  Have you had any admissions or emergency room visits or worsening of your condition(s) since last visit? No  Have you had any visits with new specialists or providers since your last visit? No  Have you had any new health care problem(s) since your last visit? No  Have you run out of any of your medications since you last spoke with clinical pharmacist? No  Are there any medications you are not taking as prescribed? No  Are you having any issues or side effects with your medications? No  Do you have any other health concerns or questions you want to discuss with your Clinical Pharmacist before your next visit? No  Are there any health concerns that you feel we can do a better job addressing? No  Are you having any problems with any of the following  since the last visit: (select all that apply)  None  12. Any falls since last visit? No   13. Any increased or uncontrolled pain since last visit? No   14. Next visit Type: office       Visit with: Dr. Sedalia Muta        Date: 10-01-2021        Time: 1:30  15. Additional Details? No     Care Gaps: Hep C screening overdue Covid booster overdue Yearly ophthalmology overdue Flu vaccine overdue  Star Rating Drugs: Losartan 50 mg- Last filled  09-16-2021 30 DS. Previous filled 08-17-2021 30 DS Rosuvastatin 5 mg- Last filled 09-16-2021 30 DS. Previous filled 08-17-2021 30 DS  Malecca [facility] Clinical Pharmacist Assistant [phone]

## 2021-09-24 ENCOUNTER — Other Ambulatory Visit: Payer: Medicare HMO

## 2021-09-24 DIAGNOSIS — I152 Hypertension secondary to endocrine disorders: Secondary | ICD-10-CM

## 2021-09-24 DIAGNOSIS — E1159 Type 2 diabetes mellitus with other circulatory complications: Secondary | ICD-10-CM | POA: Diagnosis not present

## 2021-09-24 DIAGNOSIS — E782 Mixed hyperlipidemia: Secondary | ICD-10-CM

## 2021-09-24 DIAGNOSIS — E1165 Type 2 diabetes mellitus with hyperglycemia: Secondary | ICD-10-CM

## 2021-09-25 LAB — CBC WITH DIFFERENTIAL/PLATELET
Basophils Absolute: 0.1 10*3/uL (ref 0.0–0.2)
Basos: 1 %
EOS (ABSOLUTE): 0.4 10*3/uL (ref 0.0–0.4)
Eos: 5 %
Hematocrit: 40.4 % (ref 34.0–46.6)
Hemoglobin: 13.3 g/dL (ref 11.1–15.9)
Immature Grans (Abs): 0 10*3/uL (ref 0.0–0.1)
Immature Granulocytes: 0 %
Lymphocytes Absolute: 2.4 10*3/uL (ref 0.7–3.1)
Lymphs: 34 %
MCH: 29 pg (ref 26.6–33.0)
MCHC: 32.9 g/dL (ref 31.5–35.7)
MCV: 88 fL (ref 79–97)
Monocytes Absolute: 0.6 10*3/uL (ref 0.1–0.9)
Monocytes: 9 %
Neutrophils Absolute: 3.5 10*3/uL (ref 1.4–7.0)
Neutrophils: 51 %
Platelets: 292 10*3/uL (ref 150–450)
RBC: 4.58 x10E6/uL (ref 3.77–5.28)
RDW: 13.1 % (ref 11.7–15.4)
WBC: 6.9 10*3/uL (ref 3.4–10.8)

## 2021-09-25 LAB — COMPREHENSIVE METABOLIC PANEL
ALT: 22 IU/L (ref 0–32)
AST: 23 IU/L (ref 0–40)
Albumin/Globulin Ratio: 2.1 (ref 1.2–2.2)
Albumin: 4.4 g/dL (ref 3.9–4.9)
Alkaline Phosphatase: 80 IU/L (ref 44–121)
BUN/Creatinine Ratio: 19 (ref 12–28)
BUN: 20 mg/dL (ref 8–27)
Bilirubin Total: 0.3 mg/dL (ref 0.0–1.2)
CO2: 26 mmol/L (ref 20–29)
Calcium: 10.1 mg/dL (ref 8.7–10.3)
Chloride: 97 mmol/L (ref 96–106)
Creatinine, Ser: 1.06 mg/dL — ABNORMAL HIGH (ref 0.57–1.00)
Globulin, Total: 2.1 g/dL (ref 1.5–4.5)
Glucose: 98 mg/dL (ref 70–99)
Potassium: 4 mmol/L (ref 3.5–5.2)
Sodium: 139 mmol/L (ref 134–144)
Total Protein: 6.5 g/dL (ref 6.0–8.5)
eGFR: 57 mL/min/{1.73_m2} — ABNORMAL LOW (ref >59)

## 2021-09-25 LAB — HEMOGLOBIN A1C
Est. average glucose Bld gHb Est-mCnc: 123 mg/dL
Hgb A1c MFr Bld: 5.9 % — ABNORMAL HIGH (ref 4.8–5.6)

## 2021-09-25 LAB — CARDIOVASCULAR RISK ASSESSMENT

## 2021-09-25 LAB — LIPID PANEL
Chol/HDL Ratio: 2.8 ratio (ref 0.0–4.4)
Cholesterol, Total: 147 mg/dL (ref 100–199)
HDL: 52 mg/dL (ref >39)
LDL Chol Calc (NIH): 67 mg/dL (ref 0–99)
Triglycerides: 169 mg/dL — ABNORMAL HIGH (ref 0–149)
VLDL Cholesterol Cal: 28 mg/dL (ref 5–40)

## 2021-09-26 ENCOUNTER — Encounter: Payer: Self-pay | Admitting: Family Medicine

## 2021-09-27 NOTE — Progress Notes (Signed)
Blood count normal.  Liver function normal.  Kidney function little off. Hydrate and keep appt in few days. May recheck CMP at her appt on 9/18.  HBA1C: stable.

## 2021-10-01 ENCOUNTER — Telehealth: Payer: Self-pay

## 2021-10-01 ENCOUNTER — Encounter: Payer: Self-pay | Admitting: Family Medicine

## 2021-10-01 ENCOUNTER — Ambulatory Visit (INDEPENDENT_AMBULATORY_CARE_PROVIDER_SITE_OTHER): Payer: Medicare HMO | Admitting: Family Medicine

## 2021-10-01 VITALS — BP 122/82 | HR 80 | Temp 97.8°F | Resp 15 | Ht 66.0 in | Wt 209.0 lb

## 2021-10-01 DIAGNOSIS — F411 Generalized anxiety disorder: Secondary | ICD-10-CM | POA: Diagnosis not present

## 2021-10-01 DIAGNOSIS — G8929 Other chronic pain: Secondary | ICD-10-CM

## 2021-10-01 DIAGNOSIS — N3000 Acute cystitis without hematuria: Secondary | ICD-10-CM | POA: Diagnosis not present

## 2021-10-01 DIAGNOSIS — R7989 Other specified abnormal findings of blood chemistry: Secondary | ICD-10-CM | POA: Diagnosis not present

## 2021-10-01 DIAGNOSIS — E1169 Type 2 diabetes mellitus with other specified complication: Secondary | ICD-10-CM | POA: Diagnosis not present

## 2021-10-01 DIAGNOSIS — R350 Frequency of micturition: Secondary | ICD-10-CM | POA: Diagnosis not present

## 2021-10-01 DIAGNOSIS — I152 Hypertension secondary to endocrine disorders: Secondary | ICD-10-CM

## 2021-10-01 DIAGNOSIS — E782 Mixed hyperlipidemia: Secondary | ICD-10-CM | POA: Diagnosis not present

## 2021-10-01 DIAGNOSIS — M25511 Pain in right shoulder: Secondary | ICD-10-CM

## 2021-10-01 DIAGNOSIS — B351 Tinea unguium: Secondary | ICD-10-CM | POA: Diagnosis not present

## 2021-10-01 DIAGNOSIS — E1159 Type 2 diabetes mellitus with other circulatory complications: Secondary | ICD-10-CM

## 2021-10-01 DIAGNOSIS — F5102 Adjustment insomnia: Secondary | ICD-10-CM

## 2021-10-01 DIAGNOSIS — M51369 Other intervertebral disc degeneration, lumbar region without mention of lumbar back pain or lower extremity pain: Secondary | ICD-10-CM

## 2021-10-01 DIAGNOSIS — R69 Illness, unspecified: Secondary | ICD-10-CM | POA: Diagnosis not present

## 2021-10-01 DIAGNOSIS — M5136 Other intervertebral disc degeneration, lumbar region: Secondary | ICD-10-CM | POA: Diagnosis not present

## 2021-10-01 LAB — POCT URINALYSIS DIPSTICK
Bilirubin, UA: NEGATIVE
Blood, UA: NEGATIVE
Glucose, UA: NEGATIVE
Ketones, UA: NEGATIVE
Nitrite, UA: NEGATIVE
Protein, UA: NEGATIVE
Spec Grav, UA: 1.01 (ref 1.010–1.025)
Urobilinogen, UA: NEGATIVE E.U./dL — AB
pH, UA: 6.5 (ref 5.0–8.0)

## 2021-10-01 MED ORDER — TRIAMCINOLONE ACETONIDE 40 MG/ML IJ SUSP
40.0000 mg | Freq: Once | INTRAMUSCULAR | Status: AC
  Administered 2021-10-01: 40 mg via INTRA_ARTICULAR

## 2021-10-01 MED ORDER — CLONAZEPAM 1 MG PO TABS: 1.0000 mg | ORAL_TABLET | Freq: Every evening | ORAL | 2 refills | Status: DC | PRN

## 2021-10-01 MED ORDER — ROSUVASTATIN CALCIUM 5 MG PO TABS: 5.0000 mg | ORAL_TABLET | Freq: Every day | ORAL | 1 refills | Status: DC

## 2021-10-01 MED ORDER — TERBINAFINE HCL 250 MG PO TABS: 250.0000 mg | ORAL_TABLET | Freq: Every day | ORAL | 1 refills | Status: DC

## 2021-10-01 NOTE — Assessment & Plan Note (Signed)
Risks were discussed including bleeding, infection, increase in sugars if diabetic, atrophy at site of injection, and increased pain.  After consent was obtained, using sterile technique the  shoulder was prepped with alcohol.  The right shoulder joint was entered posteriorly and  Kenalog 40 mg and 5 ml plain Lidocaine was then injected and the needle withdrawn.  The procedure was well tolerated.   The patient is asked to continue to rest the joint for a few more days before resuming regular activities.  It may be more painful for the first 1-2 days.  Watch for fever, or increased swelling or persistent pain in the joint. Call or return to clinic prn if such symptoms occur or there is failure to improve as anticipated.

## 2021-10-01 NOTE — Assessment & Plan Note (Signed)
Improved, but not resolved.  The current medical regimen is effective;  continue present plan and medications. Terbinafine.

## 2021-10-01 NOTE — Assessment & Plan Note (Signed)
Increase clonazepam to 1 mg before bed.  

## 2021-10-01 NOTE — Assessment & Plan Note (Signed)
Control: good Recommend check sugars fasting daily. Recommend check feet daily. Recommend annual eye exams. Medicines: Continue victoza 1.8 mg daily.  Continue to work on eating a healthy diet and exercise.  Labs drawn today.

## 2021-10-01 NOTE — Chronic Care Management (AMB) (Signed)
[employer] patient assistance program notification:  120- day supply of Victoza 6 mg/ml Pen was sent 09/06/2021 and should arrive to the office in 10-14 business days. Patient has 1  refill remaining and enrollment will expire on 12/13/2021.  The next refill for patient will be fulfilled on 11/23/2021.  Pattricia Boss, [place] Pharmacist Assistant [phone]'

## 2021-10-01 NOTE — Progress Notes (Unsigned)
Subjective:  Patient ID: Anna Greene, female    DOB: 1955/11/06  Age: 66 y.o. MRN: [id number]  Chief Complaint  Patient presents with   Hypertension   Diabetes   Hyperlipidemia   HPI:  Diabetes:  Complications: Hyperlipidemia, hypertension Glucose checking: No Glucose logs: No Hypoglycemia: She is not sure Most recent A1C: 5.9% Current medications: Victoza 1.8 mg daily. Last Eye Exam: July 2022 Foot checks: Daily  Hyperlipidemia: Current medications: Rosuvastatin 5 mg daily.  Hypertension: Complications: Diabetes Current medications: Atenolol-chlorthalidone 100-25 mg daily, Losartan 50 mg daily.   GAD:  Taking klonopin 0.5 mg daily PRN. Usually   Onychomycosis: She is taking terbinafine 250 mg daily. Improved.   GERD: Taking Omeprazole 20 mg every morning.  Insomnia: Patient is on clonazepam 0.5 mg nightly.  She is waking up twice at night to urinate.  This is unusual for her.  She does has difficulty going back to sleep because her mind races.  She denies depression or anxiety.  Requesting either an increase in her clonazepam or trialing another medicine   Current Outpatient Medications on File Prior to Visit  Medication Sig Dispense Refill   atenolol-chlorthalidone (TENORETIC) 100-25 MG tablet Take 1 tablet by mouth daily. 90 tablet 1   celecoxib (CELEBREX) 200 MG capsule Take 1 capsule (200 mg total) by mouth 2 (two) times daily. 180 capsule 0   cetirizine (ZYRTEC) 10 MG tablet Take 1 tablet (10 mg total) by mouth daily. 90 tablet 3   Insulin Pen Needle 32G X 6 MM MISC 1 each by Does not apply route daily. Use with victoza samples 30 each 2   liraglutide (VICTOZA) 18 MG/3ML SOPN Inject 1.8 mg into the skin daily. PAP Approved 2023     losartan (COZAAR) 50 MG tablet Take 1 tablet (50 mg total) by mouth daily. 90 tablet 0   omeprazole (PRILOSEC) 20 MG capsule TAKE ONE CAPSULE BY MOUTH EVERY MORNING 90 capsule 1   potassium chloride (KLOR-CON) 10 MEQ tablet TAKE ONE TABLET  BY MOUTH EVERY MORNING 90 tablet 1   tiZANidine (ZANAFLEX) 4 MG tablet Take 1 tablet (4 mg total) by mouth every 8 (eight) hours as needed for muscle spasms. 270 tablet 0   No current facility-administered medications on file prior to visit.   Past Medical History:  Diagnosis Date   Benign fasciculation-cramp syndrome    Depression    Diabetes mellitus without complication (HCC)    GERD (gastroesophageal reflux disease)    Hyperlipidemia    Hypertension    Nephrolithiasis    Osteoarthritis    Bilateral knees, degenerative disc disease.  Patient has had epidural steroid injections x2   Plantar fasciitis    Past Surgical History:  Procedure Laterality Date   BREAST REDUCTION SURGERY Bilateral 1996   CESAREAN SECTION  1976   CESAREAN SECTION  1979   KNEE ARTHROSCOPY W/ MENISCAL REPAIR Left 1995   Per patient muscle was repaired   KNEE ARTHROSCOPY W/ MENISCAL REPAIR Right 2003   knee surgery Left 10/2020   meniscal repair via arthroscopy   PANNICULECTOMY     REDUCTION MAMMAPLASTY  1996   REPLACEMENT TOTAL KNEE Right 2019   TONSILLECTOMY  1960    Family History  Problem Relation Age of Onset   Stroke Mother    Heart disease Mother    Cancer Father    Diabetes Sister    Anxiety disorder Sister    Depression Sister    Diabetes Sister    Diabetes Sister  Arthritis Sister    Stroke Brother    Breast cancer Neg Hx    Social History   Socioeconomic History   Marital status: Married    Spouse name: Ronnie   Number of children: 2   Years of education: Not on file   Highest education level: Not on file  Occupational History   Occupation: Teacher, music   Occupation: Retired Corporate treasurer  Tobacco Use   Smoking status: Never   Smokeless tobacco: Never  Scientific laboratory technician Use: Never used  Substance and Sexual Activity   Alcohol use: Never   Drug use: Never   Sexual activity: Yes    Partners: Male  Other Topics Concern   Not on file  Social History Narrative    Patient is married.  Has 2 adult children that live in [place] (where she is from)   Social Determinants of Health   Financial Resource Strain: High Risk (06/15/2021)   Overall Financial Resource Strain (CARDIA)    Difficulty of Paying Living Expenses: Hard  Food Insecurity: No Food Insecurity (05/24/2021)   Hunger Vital Sign    Worried About Running Out of Food in the Last Year: Never true    Ran Out of Food in the Last Year: Never true  Transportation Needs: No Transportation Needs (06/15/2021)   PRAPARE - Hydrologist (Medical): No    Lack of Transportation (Non-Medical): No  Physical Activity: Insufficiently Active (05/24/2021)   Exercise Vital Sign    Days of Exercise per Week: 3 days    Minutes of Exercise per Session: 40 min  Stress: No Stress Concern Present (05/24/2021)   [place]    Feeling of Stress : Only a little  Social Connections: Unknown (05/24/2021)   Social Connection and Isolation Panel [NHANES]    Frequency of Communication with Friends and Family: Twice a week    Frequency of Social Gatherings with Friends and Family: Patient refused    Attends Religious Services: Not on Advertising copywriter or Organizations: No    Attends Archivist Meetings: Never    Marital Status: Married    Review of Systems  Constitutional:  Negative for appetite change, fatigue and fever.  HENT:  Negative for congestion, ear pain, sinus pressure and sore throat.   Respiratory:  Negative for cough, chest tightness, shortness of breath and wheezing.   Cardiovascular:  Negative for chest pain and palpitations.  Gastrointestinal:  Negative for abdominal pain, constipation, diarrhea, nausea and vomiting.  Genitourinary:  Negative for dysuria and hematuria.  Musculoskeletal:  Negative for arthralgias, back pain, joint swelling and myalgias.  Skin:  Negative for rash.   Neurological:  Negative for dizziness, weakness and headaches.  Psychiatric/Behavioral:  Negative for dysphoric mood. The patient is not nervous/anxious.      Objective:  BP 122/82   Pulse 80   Temp 97.8 F (36.6 C)   Resp 15   Ht 5\' 6"  (1.676 m)   Wt 209 lb (94.8 kg)   LMP  (LMP Unknown)   SpO2 96%   BMI 33.73 kg/m      10/01/2021    1:27 PM 07/02/2021    1:23 PM 05/28/2021    1:52 PM  BP/Weight  Systolic BP 778 242 353  Diastolic BP 82 76 74  Wt. (Lbs) 209 210 211.2  BMI 33.73 kg/m2 33.89 kg/m2 34.09 kg/m2    Physical  Exam Vitals reviewed.  Constitutional:      Appearance: Normal appearance. She is normal weight.  Neck:     Vascular: No carotid bruit.  Cardiovascular:     Rate and Rhythm: Normal rate and regular rhythm.     Pulses: Normal pulses.     Heart sounds: Normal heart sounds.  Pulmonary:     Effort: Pulmonary effort is normal.     Breath sounds: Normal breath sounds.  Abdominal:     General: Abdomen is flat. Bowel sounds are normal.     Palpations: Abdomen is soft.     Tenderness: There is no abdominal tenderness.  Neurological:     Mental Status: She is alert and oriented to person, place, and time.  Psychiatric:        Mood and Affect: Mood normal.        Behavior: Behavior normal.     Diabetic Foot Exam - Simple   Simple Foot Form Diabetic Foot exam was performed with the following findings: Yes 10/01/2021 10:05 PM  Visual Inspection No deformities, no ulcerations, no other skin breakdown bilaterally: Yes Sensation Testing Intact to touch and monofilament testing bilaterally: Yes Pulse Check Posterior Tibialis and Dorsalis pulse intact bilaterally: Yes Comments      Lab Results  Component Value Date   WBC 6.9 09/24/2021   HGB 13.3 09/24/2021   HCT 40.4 09/24/2021   PLT 292 09/24/2021   GLUCOSE 97 10/01/2021   CHOL 147 09/24/2021   TRIG 169 (H) 09/24/2021   HDL 52 09/24/2021   LDLCALC 67 09/24/2021   ALT 20 10/01/2021   AST 23  10/01/2021   NA 145 (H) 10/01/2021   K 4.5 10/01/2021   CL 101 10/01/2021   CREATININE 0.94 10/01/2021   BUN 17 10/01/2021   CO2 28 10/01/2021   TSH 0.948 02/21/2020   HGBA1C 5.9 (H) 09/24/2021      Assessment & Plan:   Problem List Items Addressed This Visit       Cardiovascular and Mediastinum   Hypertension associated with type 2 diabetes mellitus (HCC) - Primary   Relevant Medications   rosuvastatin (CRESTOR) 5 MG tablet   Other Relevant Orders   AMB Referral to Chronic Care Management Services   TSH     Endocrine   Mixed diabetic hyperlipidemia associated with type 2 diabetes mellitus (HCC)    Control: good Recommend check sugars fasting daily. Recommend check feet daily. Recommend annual eye exams. Medicines: Continue victoza 1.8 mg daily.  Continue to work on eating a healthy diet and exercise.  Labs drawn today.         Relevant Medications   rosuvastatin (CRESTOR) 5 MG tablet   Other Relevant Orders   CBC with Differential/Platelet   Comprehensive metabolic panel   Hemoglobin A1c     Musculoskeletal and Integument   Onychomycosis    Improved, but not resolved.  The current medical regimen is effective;  continue present plan and medications. Terbinafine.        Relevant Medications   terbinafine (LAMISIL) 250 MG tablet   Degenerative disc disease, lumbar    The current medical regimen is effective;  continue present plan and medications. Continue celebrex.         Other   Hyperlipidemia    Well controlled.  No changes to medicines. Continue rosuvastatin. Continue to work on eating a healthy diet and exercise.  Labs drawn today.        Relevant Medications   rosuvastatin (CRESTOR)  5 MG tablet   Other Relevant Orders   Lipid panel   GAD (generalized anxiety disorder)    Increase clonazepam to 1 mg before bed.       Relevant Medications   clonazePAM (KLONOPIN) 1 MG tablet   Chronic right shoulder pain    Risks were discussed  including bleeding, infection, increase in sugars if diabetic, atrophy at site of injection, and increased pain.  After consent was obtained, using sterile technique the  shoulder was prepped with alcohol.  The right shoulder joint was entered posteriorly and  Kenalog 40 mg and 5 ml plain Lidocaine was then injected and the needle withdrawn.  The procedure was well tolerated.   The patient is asked to continue to rest the joint for a few more days before resuming regular activities.  It may be more painful for the first 1-2 days.  Watch for fever, or increased swelling or persistent pain in the joint. Call or return to clinic prn if such symptoms occur or there is failure to improve as anticipated.      Relevant Medications   clonazePAM (KLONOPIN) 1 MG tablet   Insomnia    Increase clonazepam to 1 mg before bed.       Other Visit Diagnoses     Elevated serum creatinine       Relevant Orders   Comprehensive metabolic panel (Completed)   Urinary frequency       Relevant Orders   POCT urinalysis dipstick (Completed)   Acute cystitis without hematuria       Relevant Orders   Urine Culture (Completed)     .  Meds ordered this encounter  Medications   clonazePAM (KLONOPIN) 1 MG tablet    Sig: Take 1 tablet (1 mg total) by mouth at bedtime as needed for anxiety.    Dispense:  30 tablet    Refill:  2   triamcinolone acetonide (KENALOG-40) injection 40 mg   terbinafine (LAMISIL) 250 MG tablet    Sig: Take 1 tablet (250 mg total) by mouth daily.    Dispense:  90 tablet    Refill:  1   rosuvastatin (CRESTOR) 5 MG tablet    Sig: Take 1 tablet (5 mg total) by mouth daily.    Dispense:  90 tablet    Refill:  1    Orders Placed This Encounter  Procedures   Urine Culture   Comprehensive metabolic panel   CBC with Differential/Platelet   Comprehensive metabolic panel   Hemoglobin A1c   Lipid panel   TSH   AMB Referral to Chronic Care Management Services   POCT urinalysis dipstick      Follow-up: Return in about 4 months (around 01/31/2022) for chronic follow up, lab visit one week before. .  An After Visit Summary was printed and given to the patient.   I,Lauren M Auman,acting as a scribe for Blane Ohara, MD.,have documented all relevant documentation on the behalf of Blane Ohara, MD,as directed by  Blane Ohara, MD while in the presence of Blane Ohara, MD.    Blane Ohara, MD [facility] [phone]

## 2021-10-02 LAB — COMPREHENSIVE METABOLIC PANEL
ALT: 20 IU/L (ref 0–32)
AST: 23 IU/L (ref 0–40)
Albumin/Globulin Ratio: 1.9 (ref 1.2–2.2)
Albumin: 4.7 g/dL (ref 3.9–4.9)
Alkaline Phosphatase: 86 IU/L (ref 44–121)
BUN/Creatinine Ratio: 18 (ref 12–28)
BUN: 17 mg/dL (ref 8–27)
Bilirubin Total: 0.3 mg/dL (ref 0.0–1.2)
CO2: 28 mmol/L (ref 20–29)
Calcium: 10.8 mg/dL — ABNORMAL HIGH (ref 8.7–10.3)
Chloride: 101 mmol/L (ref 96–106)
Creatinine, Ser: 0.94 mg/dL (ref 0.57–1.00)
Globulin, Total: 2.5 g/dL (ref 1.5–4.5)
Glucose: 97 mg/dL (ref 70–99)
Potassium: 4.5 mmol/L (ref 3.5–5.2)
Sodium: 145 mmol/L — ABNORMAL HIGH (ref 134–144)
Total Protein: 7.2 g/dL (ref 6.0–8.5)
eGFR: 66 mL/min/{1.73_m2} (ref >59)

## 2021-10-03 LAB — URINE CULTURE

## 2021-10-03 NOTE — Assessment & Plan Note (Signed)
Well controlled.  No changes to medicines. Continue rosuvastatin  Continue to work on eating a healthy diet and exercise.  Labs drawn today.   

## 2021-10-03 NOTE — Assessment & Plan Note (Signed)
Increase clonazepam to 1 mg before bed.

## 2021-10-03 NOTE — Assessment & Plan Note (Signed)
The current medical regimen is effective;  continue present plan and medications. Continue celebrex.

## 2021-10-04 NOTE — Progress Notes (Signed)
Liver function normal.  Kidney function normal.  Urine culture mixed bacteria. Not enough to diagnosed a bladder infection.

## 2021-10-08 ENCOUNTER — Telehealth: Payer: Self-pay

## 2021-10-08 ENCOUNTER — Other Ambulatory Visit: Payer: Self-pay

## 2021-10-08 DIAGNOSIS — H40003 Preglaucoma, unspecified, bilateral: Secondary | ICD-10-CM | POA: Diagnosis not present

## 2021-10-08 DIAGNOSIS — Z0100 Encounter for examination of eyes and vision without abnormal findings: Secondary | ICD-10-CM | POA: Diagnosis not present

## 2021-10-08 DIAGNOSIS — E119 Type 2 diabetes mellitus without complications: Secondary | ICD-10-CM | POA: Diagnosis not present

## 2021-10-08 DIAGNOSIS — H524 Presbyopia: Secondary | ICD-10-CM | POA: Diagnosis not present

## 2021-10-08 MED ORDER — TIZANIDINE HCL 4 MG PO TABS: 4.0000 mg | ORAL_TABLET | Freq: Three times a day (TID) | ORAL | 1 refills | Status: DC | PRN

## 2021-10-08 NOTE — Chronic Care Management (AMB) (Signed)
Chronic Care Management Pharmacy Assistant   Name: Anna Greene  MRN: [id number] DOB: 10-18-1955  Reason for Encounter: Medication Review/ Medication coordination  Recent office visits:  10-01-2021 Rochel Brome, MD. Sodium= 145, Calcium= 10.8. Abnormal UA. COMPLETED amoxicillin and gabapentin. INCREASE klonopin 0.5 mg PRN TO 1 mg at bedtime. Kenelog injection given.  Recent consult visits:  None  Hospital visits:  None in previous 6 months  Medications: Outpatient Encounter Medications as of 10/08/2021  Medication Sig   atenolol-chlorthalidone (TENORETIC) 100-25 MG tablet Take 1 tablet by mouth daily.   celecoxib (CELEBREX) 200 MG capsule Take 1 capsule (200 mg total) by mouth 2 (two) times daily.   cetirizine (ZYRTEC) 10 MG tablet Take 1 tablet (10 mg total) by mouth daily.   clonazePAM (KLONOPIN) 1 MG tablet Take 1 tablet (1 mg total) by mouth at bedtime as needed for anxiety.   Insulin Pen Needle 32G X 6 MM MISC 1 each by Does not apply route daily. Use with victoza samples   liraglutide (VICTOZA) 18 MG/3ML SOPN Inject 1.8 mg into the skin daily. PAP Approved 2023   losartan (COZAAR) 50 MG tablet Take 1 tablet (50 mg total) by mouth daily.   omeprazole (PRILOSEC) 20 MG capsule TAKE ONE CAPSULE BY MOUTH EVERY MORNING   potassium chloride (KLOR-CON) 10 MEQ tablet TAKE ONE TABLET BY MOUTH EVERY MORNING   rosuvastatin (CRESTOR) 5 MG tablet Take 1 tablet (5 mg total) by mouth daily.   terbinafine (LAMISIL) 250 MG tablet Take 1 tablet (250 mg total) by mouth daily.   tiZANidine (ZANAFLEX) 4 MG tablet Take 1 tablet (4 mg total) by mouth every 8 (eight) hours as needed for muscle spasms.   No facility-administered encounter medications on file as of 10/08/2021.  Reviewed chart for medication changes ahead of medication coordination call.  No hospital visits since last care coordination call/Pharmacist visit.   No medication changes indicated   BP Readings from Last 3 Encounters:   10/01/21 122/82  07/02/21 124/76  05/28/21 122/74    Lab Results  Component Value Date   HGBA1C 5.9 (H) 09/24/2021     Patient obtains medications through Adherence Packaging  30 Days   Last adherence delivery included:  Potassium Chloride Er 25meq 1 at Breakfast Losartan 50mg  1 at breakfast Atenolol-Chlorthalidone 100-25mg  1 at breakfast Celecoxib 200mg  1 at breakfast and 1 at evening meal Omeprazole 20mg  1 at breakfast Clonazepam 0.5mg  1 tab once daily prn (Bottles)  Rosuvastatin 5mg  1 at evening meal Cetirizine 10mg  1 at evening meal Calcium/Vitamin D3 600-800 1 at breakfast and 1 at evening meal Terbinafine HCI 250mg  1 at breakfast  Tizanidine 4mg -1 tab every 8 hours as needed          Patient declined (meds) last month: Victoza 18mg /15ml - Gets PAP Pen Needles 32g-- Not due yet   Patient is due for next adherence delivery on: 10-18-2021  Called patient and reviewed medications and coordinated delivery.  This delivery to include: Clonazepam 1 mg at bedtime PRN- Vials Calcium/Vitamin D3 600-800 1 at breakfast and 1 at evening meal Cetirizine 10mg  1 at evening meal Potassium Chloride Er 44meq 1 at Breakfast Omeprazole 20mg  1 at breakfast Losartan 50mg  1 at breakfast Atenolol-Chlorthalidone 100-25mg  1 at breakfast Celecoxib 200mg  1 at breakfast and 1 at evening meal Rosuvastatin 5mg  1 at evening meal Terbinafine HCI 250mg  1 at breakfast  Tizanidine 4mg -1 tab every 8 hours as needed- Vials  No acute/short fill needed  Patient declined the following  medications: Victoza 18mg /38ml - Gets PAP Pen Needles 32g-- Due 11-15-2021  Patient needs refills for: Request sent to PCP Tizanidine  Confirmed delivery date of 10-18-2021 advised patient that pharmacy will contact them the morning of delivery.  Care Gaps: Hep C screening overdue Covid booster overdue Yearly Ophthalmology overdue Flu vaccine overdue  Star Rating Drugs: Losartan 50 mg- Last filled 09-14-2021  30 DS Rosuvastatin 5 mg- Last filled 09-14-2021 30 DS  Malecca [facility] CMA Clinical Pharmacist Assistant [phone]

## 2021-10-09 DIAGNOSIS — M25561 Pain in right knee: Secondary | ICD-10-CM | POA: Diagnosis not present

## 2021-10-10 NOTE — Telephone Encounter (Signed)
Compliant on meds 

## 2021-10-29 DIAGNOSIS — H40003 Preglaucoma, unspecified, bilateral: Secondary | ICD-10-CM | POA: Diagnosis not present

## 2021-10-29 LAB — HM DIABETES EYE EXAM

## 2021-11-07 ENCOUNTER — Other Ambulatory Visit: Payer: Self-pay

## 2021-11-07 ENCOUNTER — Telehealth: Payer: Self-pay

## 2021-11-07 DIAGNOSIS — M5136 Other intervertebral disc degeneration, lumbar region: Secondary | ICD-10-CM

## 2021-11-07 DIAGNOSIS — I152 Hypertension secondary to endocrine disorders: Secondary | ICD-10-CM

## 2021-11-07 MED ORDER — LOSARTAN POTASSIUM 50 MG PO TABS: 50.0000 mg | ORAL_TABLET | Freq: Every day | ORAL | 1 refills | Status: DC

## 2021-11-07 MED ORDER — CELECOXIB 200 MG PO CAPS: 200.0000 mg | ORAL_CAPSULE | Freq: Two times a day (BID) | ORAL | 1 refills | Status: DC

## 2021-11-07 NOTE — Chronic Care Management (AMB) (Signed)
Chronic Care Management Pharmacy Assistant   Name: Ismelda Weatherman  MRN: [id number] DOB: August 20, 1955  Reason for Encounter: Medication Review/ Medication coordination  Recent office visits:  None  Recent consult visits:  None  Hospital visits:  None in previous 6 months  Medications: Outpatient Encounter Medications as of 11/07/2021  Medication Sig   atenolol-chlorthalidone (TENORETIC) 100-25 MG tablet Take 1 tablet by mouth daily.   celecoxib (CELEBREX) 200 MG capsule Take 1 capsule (200 mg total) by mouth 2 (two) times daily.   cetirizine (ZYRTEC) 10 MG tablet Take 1 tablet (10 mg total) by mouth daily.   clonazePAM (KLONOPIN) 1 MG tablet Take 1 tablet (1 mg total) by mouth at bedtime as needed for anxiety.   Insulin Pen Needle 32G X 6 MM MISC 1 each by Does not apply route daily. Use with victoza samples   liraglutide (VICTOZA) 18 MG/3ML SOPN Inject 1.8 mg into the skin daily. PAP Approved 2023   losartan (COZAAR) 50 MG tablet Take 1 tablet (50 mg total) by mouth daily.   omeprazole (PRILOSEC) 20 MG capsule TAKE ONE CAPSULE BY MOUTH EVERY MORNING   potassium chloride (KLOR-CON) 10 MEQ tablet TAKE ONE TABLET BY MOUTH EVERY MORNING   rosuvastatin (CRESTOR) 5 MG tablet Take 1 tablet (5 mg total) by mouth daily.   terbinafine (LAMISIL) 250 MG tablet Take 1 tablet (250 mg total) by mouth daily.   tiZANidine (ZANAFLEX) 4 MG tablet Take 1 tablet (4 mg total) by mouth every 8 (eight) hours as needed for muscle spasms.   No facility-administered encounter medications on file as of 11/07/2021.  Reviewed chart for medication changes ahead of medication coordination call.  No hospital visits since last care coordination call/Pharmacist visit.   No medication changes indicated   BP Readings from Last 3 Encounters:  10/01/21 122/82  07/02/21 124/76  05/28/21 122/74    Lab Results  Component Value Date   HGBA1C 5.9 (H) 09/24/2021     Patient obtains medications through Adherence  Packaging  30 Days   Last adherence delivery included:  Clonazepam 1 mg at bedtime PRN- Vials Calcium/Vitamin D3 600-800 1 at breakfast and 1 at evening meal Cetirizine 10mg  1 at evening meal Potassium Chloride Er 38meq 1 at Breakfast Omeprazole 20mg  1 at breakfast Losartan 50mg  1 at breakfast Atenolol-Chlorthalidone 100-25mg  1 at breakfast Celecoxib 200mg  1 at breakfast and 1 at evening meal Rosuvastatin 5mg  1 at evening meal Terbinafine HCI 250mg  1 at breakfast  Tizanidine 4mg -1 tab every 8 hours as needed- Vials  Patient declined (meds) last month: Victoza 18mg /25ml - Gets PAP Pen Needles 32g- Due 11-15-2021  Patient is due for next adherence delivery on: 11-19-2021   Called patient and reviewed medications and coordinated delivery.  This delivery to include: Clonazepam 1 mg at bedtime PRN- Vials Calcium/Vitamin D3 600-800 1 at breakfast and 1 at evening meal Cetirizine 10mg  1 at evening meal Potassium Chloride Er 29meq 1 at Breakfast Omeprazole 20mg  1 at breakfast Losartan 50mg  1 at breakfast Atenolol-Chlorthalidone 100-25mg  1 at breakfast Celecoxib 200mg  1 at breakfast and 1 at evening meal Rosuvastatin 5mg  1 at evening meal Terbinafine HCI 250mg  1 at breakfast  Tizanidine 4mg -1 tab every 8 hours as needed- Vials Amoxicillin 500 mg take 4 capsules before dental appointment- Add to delivery (bottle) Pen Needles 32g  No short/ acute fill needed  Patient declined the following medications: Victoza 18mg /81ml - Gets PAP  Patient needs refills for: Sent to PCP Losartan Celecoxib  Patient would  like to change delivery date to 11-16-2021 instead of 11-19-2021 due to being out of town. advised patient that pharmacy will contact them the morning of delivery.  Care Gaps: Hep C screening overdue Covid booster overdue Yearly Ophthalmology overdue Flu vaccine overdue  Star Rating Drugs: Losartan 50 mg- Last filled 10-15-2021 30 DS Rosuvastatin 5 mg- Last filled  10-15-2021 30 DS  Malecca [facility] CMA Clinical Pharmacist Assistant [phone]

## 2021-11-08 NOTE — Telephone Encounter (Signed)
Compliant on meds 

## 2021-11-09 ENCOUNTER — Telehealth: Payer: Self-pay

## 2021-11-09 NOTE — Telephone Encounter (Signed)
   CCM RN Visit Note   11-09-2021 Name: Anna Greene MRN: [id number]      DOB: 01/15/1955  Subjective: Anna Greene is a 66 y.o. year old female who is a primary care patient of @PCP . The patient was referred to the Chronic Care Management team for assistance with care management needs subsequent to provider initiation of CCM services and plan of care.      An unsuccessful telephone outreach was attempted today to contact the patient about Chronic Care Management needs.    Plan:The care management team will reach out to the patient again over the next 30 days.  [name] RN, MSN, CCM RN Care Manager  Chronic Care Management Direct Number: [phone]

## 2021-11-12 ENCOUNTER — Ambulatory Visit (INDEPENDENT_AMBULATORY_CARE_PROVIDER_SITE_OTHER): Payer: Medicare HMO

## 2021-11-12 DIAGNOSIS — I152 Hypertension secondary to endocrine disorders: Secondary | ICD-10-CM

## 2021-11-12 DIAGNOSIS — E1165 Type 2 diabetes mellitus with hyperglycemia: Secondary | ICD-10-CM

## 2021-11-12 NOTE — Chronic Care Management (AMB) (Signed)
Chronic Care Management Provider Comprehensive Care Plan    11/12/2021 Name: Anna Greene MRN: [id number] DOB: 23-Mar-1955  Referral to Chronic Care Management (CCM) services was placed by Provider:  Dr. Rochel Brome on Date: 10-01-2021.  Chronic Condition 1: HTN Provider Assessment and Plan  Relevant Medications     rosuvastatin (CRESTOR) 5 MG tablet    Other Relevant Orders    AMB Referral to Chronic Care Management Services    TSH     Expected Outcome/Goals Addressed This Visit (Provider CCM goals/Provider Assessment and plan   CCM (HYPERTENSION)  EXPECTED OUTCOME:  MONITOR,SELF- MANAGE AND REDUCE SYMPTOMS OF HYPERTENSION   Symptom Management Condition 1: Take all medications as prescribed Attend all scheduled provider appointments Call pharmacy for medication refills 3-7 days in advance of running out of medications Call provider office for new concerns or questions  call the Suicide and Crisis Lifeline: 988 call the [place] National Suicide Prevention Lifeline: 602 616 3684 or TTY: (509) 438-4873 TTY [phone]) to talk to a trained counselor call 1-800-273-TALK (toll free, 24 hour hotline) if experiencing a Mental Health or [place]  learn about high blood pressure call doctor for signs and symptoms of high blood pressure keep all doctor appointments take medications for blood pressure exactly as prescribed report new symptoms to your doctor  Chronic Condition 2: DM Provider Assessment and Plan Control: good Recommend check sugars fasting daily. Recommend check feet daily. Recommend annual eye exams. Medicines: Continue victoza 1.8 mg daily.  Continue to work on eating a healthy diet and exercise.  Labs drawn today.       Expected Outcome/Goals Addressed This Visit (Provider CCM goals/Provider Assessment and plan   CCM (DIABETES) EXPECTED OUTCOME: MONITOR, SELF-MANAGE AND REDUCE SYMPTOMS OF DIABETES   Symptom Management Condition 2: Take all  medications as prescribed Attend all scheduled provider appointments Call pharmacy for medication refills 3-7 days in advance of running out of medications Call provider office for new concerns or questions  call the Suicide and Crisis Lifeline: 988 call the [place] National Suicide Prevention Lifeline: 505 417 3518 or TTY: 774-150-1560 TTY 4124597044) to talk to a trained counselor call 1-800-273-TALK (toll free, 24 hour hotline) if experiencing a Mental Health or [place]  check blood sugar at prescribed times: when you have symptoms of low or high blood sugar check feet daily for cuts, sores or redness trim toenails straight across keep feet up while sitting wash and dry feet carefully every day wear comfortable, cotton socks wear comfortable, well-fitting shoes  Problem List Patient Active Problem List   Diagnosis Date Noted   Myalgia 07/02/2021   Nocturia 05/27/2021   Chronic right shoulder pain 05/27/2021   Degenerative disc disease, lumbar 05/27/2021   Insomnia 05/27/2021   GAD (generalized anxiety disorder) 02/14/2021   Mixed diabetic hyperlipidemia associated with type 2 diabetes mellitus ([place]) 02/14/2021   Onychomycosis 02/14/2021   Acute infection of nasal sinus 02/14/2021   Rash 02/14/2021   Plantar fasciitis 11/14/2020   Class 1 obesity due to excess calories with serious comorbidity and body mass index (BMI) of 34.0 to 34.9 in adult 11/14/2020   Gastroesophageal reflux disease without esophagitis 11/14/2020   Degeneration of lumbar intervertebral disc 05/01/2020   Localized, primary osteoarthritis 05/01/2020   Low back pain 05/01/2020   Lumbar radiculopathy 05/01/2020   Osteoarthritis of knee 05/01/2020   Mixed anxiety and depressive disorder 02/18/2020   Combined hyperlipidemia associated with type 2 diabetes mellitus ([place]) 02/18/2020   Hypertension associated with type 2 diabetes  mellitus ([facility]) 02/18/2020   Osteoporosis 02/18/2020    Hyperlipidemia 02/18/2020   Foot pain 06/22/2010    Medication Management  Current Outpatient Medications:    atenolol-chlorthalidone (TENORETIC) 100-25 MG tablet, Take 1 tablet by mouth daily., Disp: 90 tablet, Rfl: 1   celecoxib (CELEBREX) 200 MG capsule, Take 1 capsule (200 mg total) by mouth 2 (two) times daily., Disp: 180 capsule, Rfl: 1   cetirizine (ZYRTEC) 10 MG tablet, Take 1 tablet (10 mg total) by mouth daily., Disp: 90 tablet, Rfl: 3   clonazePAM (KLONOPIN) 1 MG tablet, Take 1 tablet (1 mg total) by mouth at bedtime as needed for anxiety., Disp: 30 tablet, Rfl: 2   Insulin Pen Needle 32G X 6 MM MISC, 1 each by Does not apply route daily. Use with victoza samples, Disp: 30 each, Rfl: 2   liraglutide (VICTOZA) 18 MG/3ML SOPN, Inject 1.8 mg into the skin daily. PAP Approved 2023, Disp: , Rfl:    losartan (COZAAR) 50 MG tablet, Take 1 tablet (50 mg total) by mouth daily., Disp: 90 tablet, Rfl: 1   omeprazole (PRILOSEC) 20 MG capsule, TAKE ONE CAPSULE BY MOUTH EVERY MORNING, Disp: 90 capsule, Rfl: 1   potassium chloride (KLOR-CON) 10 MEQ tablet, TAKE ONE TABLET BY MOUTH EVERY MORNING, Disp: 90 tablet, Rfl: 1   rosuvastatin (CRESTOR) 5 MG tablet, Take 1 tablet (5 mg total) by mouth daily., Disp: 90 tablet, Rfl: 1   terbinafine (LAMISIL) 250 MG tablet, Take 1 tablet (250 mg total) by mouth daily., Disp: 90 tablet, Rfl: 1   tiZANidine (ZANAFLEX) 4 MG tablet, Take 1 tablet (4 mg total) by mouth every 8 (eight) hours as needed for muscle spasms., Disp: 270 tablet, Rfl: 1  Cognitive Assessment Identity Confirmed: : Name; DOB Cognitive Status: Normal   Functional Assessment Hearing Difficulty or Deaf: no Wear Glasses or Blind: yes Vision Management: wears glasses Concentrating, Remembering or Making Decisions Difficulty (CP): no Difficulty Communicating: no Difficulty Eating/Swallowing: no Walking or Climbing Stairs Difficulty: no Dressing/Bathing Difficulty: no Doing Errands  Independently Difficulty (such as shopping) (CP): no Change in Functional Status Since Onset of Current Illness/Injury: no   Caregiver Assessment  Primary Source of Support/Comfort: spouse Name of Support/Comfort Primary Source: [name] People in Home: spouse Name(s) of People in Home: Rosanne Ashing Family Caregiver if Needed: spouse Family Caregiver Names: Christen Bame- husband Primary Roles/Responsibilities: retired   Planned Interventions  Provided education to patient about basic DM disease process; Reviewed medications with patient and discussed importance of medication adherence;        Reviewed prescribed diet with patient heart healthy/ADA diet ; Counseled on importance of regular laboratory monitoring as prescribed;        Discussed plans with patient for ongoing care management follow up and provided patient with direct contact information for care management team;      Provided patient with written educational materials related to hypo and hyperglycemia and importance of correct treatment;       Reviewed scheduled/upcoming provider appointments including: 02-04-2022;         Advised patient, providing education and rationale, to check cbg when you have symptoms of low or high blood sugar and record        call provider for findings outside established parameters;       Referral made to pharmacy team for assistance with medication questions and assistance with PAP for Victoza. Is on pill packaging system with next scheduled delivery for 11-19-2021;  Review of patient status, including review of consultants reports, relevant laboratory and other test results, and medications completed;       Advised patient to discuss changes in DM or chronic conditions with provider;      Screening for signs and symptoms of depression related to chronic disease state;        Assessed social determinant of health barriers;        Evaluation of current treatment plan related to hypertension self  management and patient's adherence to plan as established by provider;   Provided education to patient re: stroke prevention, s/s of heart attack and stroke; Reviewed prescribed diet heart healthy/ADA diet Reviewed medications with patient and discussed importance of compliance;  Discussed plans with patient for ongoing care management follow up and provided patient with direct contact information for care management team; Reviewed scheduled/upcoming provider appointments including:  Advised patient to discuss changes in blood pressures or heart health with provider; Provided education on prescribed diet heart healthy/ADA;  Discussed complications of poorly controlled blood pressure such as heart disease, stroke, circulatory complications, vision complications, kidney impairment, sexual dysfunction;  Screening for signs and symptoms of depression related to chronic disease state;  Assessed social determinant of health barriers;   Interaction and coordination with outside resources, practitioners, and providers See CCM Referral  Care Plan: Available in MyChart

## 2021-11-12 NOTE — Patient Instructions (Signed)
Please call the care guide team at [phone] if you need to cancel or reschedule your appointment.   If you are experiencing a Mental Health or Behavioral Health Crisis or need someone to talk to, please call the Suicide and Crisis Lifeline: 988 call the [place] National Suicide Prevention Lifeline: (650)885-4843 or TTY: (934) 504-1848 TTY [phone]) to talk to a trained counselor call 1-800-273-TALK (toll free, 24 hour hotline)   Following is a copy of your full provider care plan:   Goals Addressed             This Visit's Progress    CCM Expected Outcome:  Monitor, Self-Manage and Reduce Symptoms of Diabetes       Current Barriers:  Care Coordination needs related to cost of Victoza and current pap in a patient with DM Chronic Disease Management support and education needs related to effective management of DM Financial Constraints.   Planned Interventions: Provided education to patient about basic DM disease process; Reviewed medications with patient and discussed importance of medication adherence;        Reviewed prescribed diet with patient heart healthy/ADA diet ; Counseled on importance of regular laboratory monitoring as prescribed;        Discussed plans with patient for ongoing care management follow up and provided patient with direct contact information for care management team;      Provided patient with written educational materials related to hypo and hyperglycemia and importance of correct treatment;       Reviewed scheduled/upcoming provider appointments including: 02-04-2022;         Advised patient, providing education and rationale, to check cbg when you have symptoms of low or high blood sugar and record        call provider for findings outside established parameters;       Referral made to pharmacy team for assistance with medication questions and assistance with PAP for Victoza. Is on pill packaging system with next scheduled delivery for 11-19-2021;        Review of patient status, including review of consultants reports, relevant laboratory and other test results, and medications completed;       Advised patient to discuss changes in DM or chronic conditions with provider;      Screening for signs and symptoms of depression related to chronic disease state;        Assessed social determinant of health barriers;         Symptom Management: Take medications as prescribed   Attend all scheduled provider appointments Call pharmacy for medication refills 3-7 days in advance of running out of medications Call provider office for new concerns or questions  call the Suicide and Crisis Lifeline: 988 call the [place] National Suicide Prevention Lifeline: (340)703-5332 or TTY: 305-356-1852 TTY 959 524 2571) to talk to a trained counselor call 1-800-273-TALK (toll free, 24 hour hotline) if experiencing a Mental Health or Behavioral Health Crisis  check blood sugar at prescribed times: when you have symptoms of low or high blood sugar check feet daily for cuts, sores or redness trim toenails straight across wash and dry feet carefully every day wear comfortable, cotton socks wear comfortable, well-fitting shoes  Follow Up Plan: Telephone follow up appointment with care management team member scheduled for: 12-24-2021 at 145 pm       CCM Expected Outcome:  Monitor, Self-Manage, and Reduce Symptoms of Hypertension       Current Barriers:  Chronic Disease Management support and education needs related to effective management of  HTN Financial Constraints.   Planned Interventions: Evaluation of current treatment plan related to hypertension self management and patient's adherence to plan as established by provider;   Provided education to patient re: stroke prevention, s/s of heart attack and stroke; Reviewed prescribed diet heart healthy/ADA diet Reviewed medications with patient and discussed importance of compliance;  Discussed plans with patient  for ongoing care management follow up and provided patient with direct contact information for care management team; Reviewed scheduled/upcoming provider appointments including:  Advised patient to discuss changes in blood pressures or heart health with provider; Provided education on prescribed diet heart healthy/ADA;  Discussed complications of poorly controlled blood pressure such as heart disease, stroke, circulatory complications, vision complications, kidney impairment, sexual dysfunction;  Screening for signs and symptoms of depression related to chronic disease state;  Assessed social determinant of health barriers;   Symptom Management: Take medications as prescribed   Attend all scheduled provider appointments Call pharmacy for medication refills 3-7 days in advance of running out of medications Call provider office for new concerns or questions  call the Suicide and Crisis Lifeline: 988 call the [place] National Suicide Prevention Lifeline: 934-003-0742 or TTY: 640-876-6153 TTY [phone]) to talk to a trained counselor call 1-800-273-TALK (toll free, 24 hour hotline) if experiencing a Mental Health or [place] about high blood pressure call doctor for signs and symptoms of high blood pressure develop an action plan for high blood pressure keep all doctor appointments take medications for blood pressure exactly as prescribed report new symptoms to your doctor  Follow Up Plan: Telephone follow up appointment with care management team member scheduled for: 12-24-2021 at 145 pm          Patient verbalizes understanding of instructions and care plan provided today and agrees to view in [place]. Active MyChart status and patient understanding of how to access instructions and care plan via MyChart confirmed with patient.     Telephone follow up appointment with care management team member scheduled for: 12-24-2021 at 145 pm

## 2021-11-12 NOTE — Chronic Care Management (AMB) (Signed)
Chronic Care Management   CCM RN Visit Note  11/12/2021 Name: Anna Greene MRN: [id number] DOB: 23-Apr-1955  Subjective: Anna Greene is a 65 y.o. year old female who is a primary care patient of Cox, Kirsten, MD. The patient was referred to the Chronic Care Management team for assistance with care management needs subsequent to provider initiation of CCM services and plan of care.    Today's Visit:  Engaged with patient by telephone for initial visit.     SDOH Interventions Today    Flowsheet Row Most Recent Value  SDOH Interventions   Food Insecurity Interventions Intervention Not Indicated  Housing Interventions Intervention Not Indicated  Transportation Interventions Intervention Not Indicated  Utilities Interventions Intervention Not Indicated  Alcohol Usage Interventions Intervention Not Indicated (Score <7)  Financial Strain Interventions Other (Comment)  [PAP assistance for medications]  Social Connections Interventions Intervention Not Indicated, Other (Comment)  [has good support system, from [place], she and her husband have lived in [place] for 2.5 years]         Goals Addressed             This Visit's Progress    CCM Expected Outcome:  Monitor, Self-Manage and Reduce Symptoms of Diabetes       Current Barriers:  Care Coordination needs related to cost of Victoza and current pap in a patient with DM Chronic Disease Management support and education needs related to effective management of DM Financial Constraints.   Planned Interventions: Provided education to patient about basic DM disease process; Reviewed medications with patient and discussed importance of medication adherence;        Reviewed prescribed diet with patient heart healthy/ADA diet ; Counseled on importance of regular laboratory monitoring as prescribed;        Discussed plans with patient for ongoing care management follow up and provided patient with direct contact information for care management  team;      Provided patient with written educational materials related to hypo and hyperglycemia and importance of correct treatment;       Reviewed scheduled/upcoming provider appointments including: 02-04-2022;         Advised patient, providing education and rationale, to check cbg when you have symptoms of low or high blood sugar and record        call provider for findings outside established parameters;       Referral made to pharmacy team for assistance with medication questions and assistance with PAP for Victoza. Is on pill packaging system with next scheduled delivery for 11-19-2021;       Review of patient status, including review of consultants reports, relevant laboratory and other test results, and medications completed;       Advised patient to discuss changes in DM or chronic conditions with provider;      Screening for signs and symptoms of depression related to chronic disease state;        Assessed social determinant of health barriers;         Symptom Management: Take medications as prescribed   Attend all scheduled provider appointments Call pharmacy for medication refills 3-7 days in advance of running out of medications Call provider office for new concerns or questions  call the Suicide and Crisis Lifeline: 988 call the [place] National Suicide Prevention Lifeline: 351-270-8331 or TTY: 539-467-9534 TTY 817-881-8848) to talk to a trained counselor call 1-800-273-TALK (toll free, 24 hour hotline) if experiencing a Mental Health or [place]  check blood sugar at prescribed  times: when you have symptoms of low or high blood sugar check feet daily for cuts, sores or redness trim toenails straight across wash and dry feet carefully every day wear comfortable, cotton socks wear comfortable, well-fitting shoes  Follow Up Plan: Telephone follow up appointment with care management team member scheduled for: 12-24-2021 at 145 pm       CCM Expected Outcome:   Monitor, Self-Manage, and Reduce Symptoms of Hypertension       Current Barriers:  Chronic Disease Management support and education needs related to effective management of HTN Financial Constraints.   Planned Interventions: Evaluation of current treatment plan related to hypertension self management and patient's adherence to plan as established by provider;   Provided education to patient re: stroke prevention, s/s of heart attack and stroke; Reviewed prescribed diet heart healthy/ADA diet Reviewed medications with patient and discussed importance of compliance;  Discussed plans with patient for ongoing care management follow up and provided patient with direct contact information for care management team; Reviewed scheduled/upcoming provider appointments including:  Advised patient to discuss changes in blood pressures or heart health with provider; Provided education on prescribed diet heart healthy/ADA;  Discussed complications of poorly controlled blood pressure such as heart disease, stroke, circulatory complications, vision complications, kidney impairment, sexual dysfunction;  Screening for signs and symptoms of depression related to chronic disease state;  Assessed social determinant of health barriers;   Symptom Management: Take medications as prescribed   Attend all scheduled provider appointments Call pharmacy for medication refills 3-7 days in advance of running out of medications Call provider office for new concerns or questions  call the Suicide and Crisis Lifeline: 988 call the [place] National Suicide Prevention Lifeline: 2198601888 or TTY: 813-761-3405 TTY 6690260253) to talk to a trained counselor call 1-800-273-TALK (toll free, 24 hour hotline) if experiencing a Mental Health or Behavioral Health Crisis  learn about high blood pressure call doctor for signs and symptoms of high blood pressure develop an action plan for high blood pressure keep all doctor  appointments take medications for blood pressure exactly as prescribed report new symptoms to your doctor  Follow Up Plan: Telephone follow up appointment with care management team member scheduled for: 12-24-2021 at 145 pm          Plan:Telephone follow up appointment with care management team member scheduled for:  12-24-2021 at 145 pm   Alto Denver RN, MSN, CCM RN Care Manager  Chronic Care Management Direct Number: [phone]

## 2021-11-13 DIAGNOSIS — I10 Essential (primary) hypertension: Secondary | ICD-10-CM

## 2021-11-13 DIAGNOSIS — E1159 Type 2 diabetes mellitus with other circulatory complications: Secondary | ICD-10-CM

## 2021-11-23 DIAGNOSIS — M5416 Radiculopathy, lumbar region: Secondary | ICD-10-CM | POA: Diagnosis not present

## 2021-11-26 ENCOUNTER — Telehealth: Payer: Medicare HMO

## 2021-11-27 ENCOUNTER — Encounter: Payer: Self-pay | Admitting: Family Medicine

## 2021-12-04 ENCOUNTER — Telehealth: Payer: Self-pay

## 2021-12-04 NOTE — Progress Notes (Signed)
Chronic Care Management Pharmacy Assistant   Name: Anna Greene  MRN: [id number] DOB: [birth year]   Reason for Encounter: Medication Coordination for [facility]   Recent office visits:  None  Recent consult visits:  None  Hospital visits:  None  Medications: Outpatient Encounter Medications as of 12/04/2021  Medication Sig   atenolol-chlorthalidone (TENORETIC) 100-25 MG tablet Take 1 tablet by mouth daily.   celecoxib (CELEBREX) 200 MG capsule Take 1 capsule (200 mg total) by mouth 2 (two) times daily.   cetirizine (ZYRTEC) 10 MG tablet Take 1 tablet (10 mg total) by mouth daily.   clonazePAM (KLONOPIN) 1 MG tablet Take 1 tablet (1 mg total) by mouth at bedtime as needed for anxiety.   Insulin Pen Needle 32G X 6 MM MISC 1 each by Does not apply route daily. Use with victoza samples   liraglutide (VICTOZA) 18 MG/3ML SOPN Inject 1.8 mg into the skin daily. PAP Approved 2023   losartan (COZAAR) 50 MG tablet Take 1 tablet (50 mg total) by mouth daily.   omeprazole (PRILOSEC) 20 MG capsule TAKE ONE CAPSULE BY MOUTH EVERY MORNING   potassium chloride (KLOR-CON) 10 MEQ tablet TAKE ONE TABLET BY MOUTH EVERY MORNING   rosuvastatin (CRESTOR) 5 MG tablet Take 1 tablet (5 mg total) by mouth daily.   terbinafine (LAMISIL) 250 MG tablet Take 1 tablet (250 mg total) by mouth daily.   tiZANidine (ZANAFLEX) 4 MG tablet Take 1 tablet (4 mg total) by mouth every 8 (eight) hours as needed for muscle spasms.   No facility-administered encounter medications on file as of 12/04/2021.    Reviewed chart for medication changes ahead of medication coordination call.  No OVs, Consults, or hospital visits since last care coordination call/Pharmacist visit.   No medication changes indicated OR if recent visit, treatment plan here.  BP Readings from Last 3 Encounters:  10/01/21 122/82  07/02/21 124/76  05/28/21 122/74    Lab Results  Component Value Date   HGBA1C 5.9 (H) 09/24/2021     Patient  obtains medications through Adherence Packaging  30 Days   Last adherence delivery included:  Clonazepam 1 mg at bedtime PRN- Vials Calcium/Vitamin D3 600-800 1 at breakfast and 1 at evening meal Cetirizine 10mg  1 at evening meal Potassium Chloride Er 1 at Breakfast Omeprazole 20mg  1 at breakfast Losartan 50mg  1 at breakfast Atenolol-Chlorthalidone 100-25mg  1 at breakfast Celecoxib 200mg  1 at breakfast and 1 at evening meal Rosuvastatin 5mg  1 at evening meal Terbinafine HCI 250mg  1 at breakfast  Tizanidine 4mg -1 tab every 8 hours as needed- Vials Amoxicillin 500 mg take 4 capsules before dental appointment- Add to delivery (bottle) Pen Needles 32g  Patient declined (meds) last month due  Victoza 18mg /18ml - Gets PAP   Patient is due for next adherence delivery on: 12/18/21. Called patient and reviewed medications and coordinated delivery.  This delivery to include: Clonazepam 1 mg at bedtime PRN- Vials Calcium/Vitamin D3 600-800 1 at breakfast and 1 at evening meal Cetirizine 10mg  1 at evening meal Potassium Chloride Er 1 at Breakfast Omeprazole 20mg  1 at breakfast Losartan 50mg  1 at breakfast Atenolol-Chlorthalidone 100-25mg  1 at breakfast Celecoxib 200mg  1 at breakfast and 1 at evening meal Rosuvastatin 5mg  1 at evening meal Terbinafine HCI 250mg  1 at breakfast  Tizanidine 4mg -1 tab every 8 hours as needed- Vials  Patient declined the following medications  Pen Needles 32g Victoza 18mg /74ml - Gets PAP   Patient needs refills-Request sent Clonazepam 1 mg  Confirmed delivery date of 12/18/21, advised patient that pharmacy will contact them the morning of delivery.   Roxana Hires, CMA Clinical Pharmacist Assistant  [phone]

## 2021-12-05 ENCOUNTER — Ambulatory Visit (INDEPENDENT_AMBULATORY_CARE_PROVIDER_SITE_OTHER): Payer: Medicare HMO

## 2021-12-05 DIAGNOSIS — E1159 Type 2 diabetes mellitus with other circulatory complications: Secondary | ICD-10-CM

## 2021-12-05 DIAGNOSIS — E1165 Type 2 diabetes mellitus with hyperglycemia: Secondary | ICD-10-CM

## 2021-12-05 DIAGNOSIS — E1169 Type 2 diabetes mellitus with other specified complication: Secondary | ICD-10-CM

## 2021-12-05 NOTE — Progress Notes (Signed)
Chronic Care Management Pharmacy Note  12/05/2021 Name:  Anna Greene MRN:  [id number] DOB:  March 19, 1955  Summary: -Pleasant 66 year old female presents for initial CCM visit. 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 She has been working ever since she was 66 years old. 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 In her free time she loves to camp, has an RV, and loves to travel in it. Her favourite place she's been was [place] where there was a campground by a river. 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 Counseled patient to call company (Not [facility]) mid-December to check on status and to call us if any problems. She states she has the number   Subjective: Anna Greene is an 66 y.o. year old female who is a primary patient of Cox, Kirsten, MD.  The CCM team was consulted for assistance with disease management and care coordination needs.    Engaged with patient by telephone for follow up visit in response to provider referral for pharmacy case management and/or care coordination services.   Consent to Services:  The patient was given the following information about Chronic Care Management services today, agreed to services, and gave verbal consent: 1. CCM service includes personalized support from designated clinical staff supervised by the primary care provider, including individualized plan of care and coordination with other care providers 2. 24/7 contact phone numbers for assistance for urgent and routine care needs. 3. Service will only be billed when office clinical staff spend 20 minutes or more in a month to coordinate care. 4. Only  one practitioner may furnish and bill the service in a calendar month. 5.The patient may stop CCM services at any time (effective at the end of the month) by phone call to the office staff. 6. The patient will be responsible for cost sharing (co-pay) of up to 20% of the service fee (after annual deductible is met). Patient agreed to services and consent obtained.  Patient Care Team: Rochel Brome, MD as PCP - General (Family Medicine) Lane Hacker, [facility] as Pharmacist (Pharmacist) Frederik Pear, MD as Consulting Physician (Orthopedic Surgery) Mincey, Neena Rhymes, MD as Consulting Physician (Ophthalmology) Vanita Ingles, RN as Case Manager (General Practice)  Recent office visits:  None   Recent consult visits:  None   Hospital visits:  None     Objective:  Lab Results  Component Value Date   CREATININE 0.94 10/01/2021   BUN 17 10/01/2021   GFRNONAA 74 02/21/2020   GFRAA 85 02/21/2020   NA 145 (H) 10/01/2021   K 4.5 10/01/2021   CALCIUM 10.8 (H) 10/01/2021   CO2 28 10/01/2021   GLUCOSE 97 10/01/2021    Lab Results  Component Value Date/Time   HGBA1C 5.9 (H) 09/24/2021 08:50 AM   HGBA1C 5.9 (H) 05/07/2021 11:17 AM    Last diabetic Eye exam:  Lab Results  Component Value Date/Time   HMDIABEYEEXA No Retinopathy 07/24/2020 12:00 AM    Last diabetic Foot exam: No results found for: "HMDIABFOOTEX"   Lab Results  Component Value Date   CHOL 147 09/24/2021   HDL 52 09/24/2021  LDLCALC 67 09/24/2021   TRIG 169 (H) 09/24/2021   CHOLHDL 2.8 09/24/2021       Latest Ref Rng & Units 10/01/2021    2:20 PM 09/24/2021    8:50 AM 05/07/2021   11:17 AM  Hepatic Function  Total Protein 6.0 - 8.5 g/dL 7.2  6.5  7.0   Albumin 3.9 - 4.9 g/dL 4.7  4.4  4.2   AST 0 - 40 IU/L _0 ALT 0 - 32 IU/L _1 Alk Phosphatase 44 - 121 IU/L 86  80  88   Total Bilirubin 0.0 - 1.2 mg/dL 0.3  0.3  0.4     Lab Results  Component Value Date/Time   TSH 0.948 02/21/2020  11:42 AM       Latest Ref Rng & Units 09/24/2021    8:50 AM 05/07/2021   11:17 AM 02/05/2021    9:31 AM  CBC  WBC 3.4 - 10.8 x10E3/uL 6.9  7.7  8.0   Hemoglobin 11.1 - 15.9 g/dL 13.3  13.9  13.7   Hematocrit 34.0 - 46.6 % 40.4  42.5  41.5   Platelets 150 - 450 x10E3/uL 292  292  295     Lab Results  Component Value Date/Time   VD25OH 50.7 07/02/2021 01:58 PM    Clinical ASCVD: Yes  The 10-year ASCVD risk score (Arnett DK, et al., 2019) is: 15.7%   Values used to calculate the score:     Age: 66 years     Sex: Female     Is Non-Hispanic African American: No     Diabetic: Yes     Tobacco smoker: No     Systolic Blood Pressure: 786 mmHg     Is BP treated: Yes     HDL Cholesterol: 52 mg/dL     Total Cholesterol: 147 mg/dL       10/01/2021    1:32 PM 05/28/2021    2:31 PM 05/14/2021    8:23 AM  Depression screen PHQ 2/9  Decreased Interest 0 0 0  Down, Depressed, Hopeless 0 0 0  PHQ - 2 Score 0 0 0  Altered sleeping 0 2 2  Tired, decreased energy 0 0 0  Change in appetite 0 0 0  Feeling bad or failure about yourself  0 0 0  Trouble concentrating 0 0 0  Moving slowly or fidgety/restless 0 0 0  Suicidal thoughts 0 0 0  PHQ-9 Score 0 2 2  Difficult doing work/chores Not difficult at all       Other: (CHADS2VASc if Afib, MMRC or CAT for COPD, ACT, DEXA)  Social History   Tobacco Use  Smoking Status Never  Smokeless Tobacco Never   BP Readings from Last 3 Encounters:  10/01/21 122/82  07/02/21 124/76  05/28/21 122/74   Pulse Readings from Last 3 Encounters:  10/01/21 80  07/02/21 76  05/28/21 81   Wt Readings from Last 3 Encounters:  10/01/21 209 lb (94.8 kg)  07/02/21 210 lb (95.3 kg)  05/28/21 211 lb 3.2 oz (95.8 kg)   BMI Readings from Last 3 Encounters:  10/01/21 33.73 kg/m  07/02/21 33.89 kg/m  05/28/21 34.09 kg/m    Assessment/Interventions: Review of patient past medical history, allergies, medications, health status, including review of  consultants reports, laboratory and other test data, was performed as part of comprehensive evaluation and provision of chronic care management services.   SDOH:  (Social Determinants  of Health) assessments and interventions performed: Yes SDOH Interventions    Flowsheet Row Chronic Care Management from 12/05/2021 in [place] Management from 11/12/2021 in [place] Management from 06/15/2021 in [place] Office Visit from 05/28/2021 in [place] Management from 05/17/2021 in [place] Visit from 05/14/2021 in [place] Interventions        Food Insecurity Interventions -- Intervention Not Indicated -- -- -- --  Housing Interventions -- Intervention Not Indicated -- -- -- --  Transportation Interventions Intervention Not Indicated Intervention Not Indicated Intervention Not Indicated -- Intervention Not Indicated --  Utilities Interventions -- Intervention Not Indicated -- -- -- --  Alcohol Usage Interventions -- Intervention Not Indicated (Score <7) -- -- -- --  Depression Interventions/Treatment  -- -- -- PHQ2-9 Score <4 Follow-up Not Indicated -- PHQ2-9 Score <4 Follow-up Not Indicated  Financial Strain Interventions Other (Comment)  [PAP] Other (Comment)  [PAP assistance for medications] Other (Comment)  [PAP] -- Other (Comment)  [See CP] --  Social Connections Interventions -- Intervention Not Indicated, Other (Comment)  [has good support system, from [place], she and her husband have lived in [place] for 2.5 years] -- -- -- --      [place]: No Food Insecurity (11/12/2021)  Housing: Low Risk  (11/12/2021)  Transportation Needs: No Transportation Needs (12/05/2021)  Utilities: Not At Risk (11/12/2021)  Alcohol Screen: Low Risk  (11/12/2021)  Depression (PHQ2-9): Low Risk  (10/01/2021)  Financial Resource Strain: Medium Risk (12/05/2021)  Physical Activity:  Insufficiently Active (05/24/2021)  Social Connections: Moderately Isolated (11/12/2021)  Stress: No Stress Concern Present (05/24/2021)  Tobacco Use: Low Risk  (10/01/2021)    CCM Care Plan  Allergies  Allergen Reactions   Metformin And Related     Diarrhea   Ramipril Other (See Comments)    Cough   Tuberculin, Ppd Other (See Comments)    False positive    Medications Reviewed Today     Reviewed by Vanita Ingles, RN (Case Manager) on 11/12/21 at [place] List Status: <None>   Medication Order Taking? Sig Documenting Provider Last Dose Status Informant  atenolol-chlorthalidone (TENORETIC) 100-25 MG tablet [id number] No Take 1 tablet by mouth daily. Cox, Kirsten, MD Taking Active   celecoxib (CELEBREX) 200 MG capsule [id number] No Take 1 capsule (200 mg total) by mouth 2 (two) times daily. Cox, Kirsten, MD Taking Active   cetirizine (ZYRTEC) 10 MG tablet [id number] No Take 1 tablet (10 mg total) by mouth daily. Cox, Kirsten, MD Taking Active   clonazePAM (KLONOPIN) 1 MG tablet [id number] No Take 1 tablet (1 mg total) by mouth at bedtime as needed for anxiety. Cox, Kirsten, MD Taking Active   Insulin Pen Needle 32G X 6 MM MISC [id number] No 1 each by Does not apply route daily. Use with victoza samples Cox, Kirsten, MD Taking Active   liraglutide (VICTOZA) 18 MG/3ML SOPN [id number] No Inject 1.8 mg into the skin daily. PAP Approved 2023 [provider] Taking Active   losartan (COZAAR) 50 MG tablet [id number] No Take 1 tablet (50 mg total) by mouth daily. Cox, Kirsten, MD Taking Active   omeprazole (PRILOSEC) 20 MG capsule [id number] No TAKE ONE CAPSULE BY MOUTH EVERY MORNING Cox, Kirsten, MD Taking Active   potassium chloride (KLOR-CON) 10 MEQ tablet [id number] No TAKE ONE TABLET BY MOUTH EVERY MORNING Cox, Kirsten, MD Taking Active   rosuvastatin (  CRESTOR) 5 MG tablet [id number] No Take 1 tablet (5 mg total) by mouth daily. Cox, Kirsten, MD Taking Active   terbinafine (LAMISIL) 250 MG  tablet [id number] No Take 1 tablet (250 mg total) by mouth daily. Cox, Kirsten, MD Taking Active   tiZANidine (ZANAFLEX) 4 MG tablet [id number] No Take 1 tablet (4 mg total) by mouth every 8 (eight) hours as needed for muscle spasms. Rochel Brome, MD Taking Active             Patient Active Problem List   Diagnosis Date Noted   Myalgia 07/02/2021   Nocturia 05/27/2021   Chronic right shoulder pain 05/27/2021   Degenerative disc disease, lumbar 05/27/2021   Insomnia 05/27/2021   GAD (generalized anxiety disorder) 02/14/2021   Mixed diabetic hyperlipidemia associated with type 2 diabetes mellitus ([place]) 02/14/2021   Onychomycosis 02/14/2021   Acute infection of nasal sinus 02/14/2021   Rash 02/14/2021   Plantar fasciitis 11/14/2020   Class 1 obesity due to excess calories with serious comorbidity and body mass index (BMI) of 34.0 to 34.9 in adult 11/14/2020   Gastroesophageal reflux disease without esophagitis 11/14/2020   Degeneration of lumbar intervertebral disc 05/01/2020   Localized, primary osteoarthritis 05/01/2020   Low back pain 05/01/2020   Lumbar radiculopathy 05/01/2020   Osteoarthritis of knee 05/01/2020   Mixed anxiety and depressive disorder 02/18/2020   Combined hyperlipidemia associated with type 2 diabetes mellitus ([place]) 02/18/2020   Hypertension associated with type 2 diabetes mellitus ([place]) 02/18/2020   Osteoporosis 02/18/2020   Hyperlipidemia 02/18/2020   Foot pain 06/22/2010    Immunization History  Administered Date(s) Administered   Influenza, Quadrivalent, Recombinant, Inj, Pf 10/23/2016   Influenza-Unspecified 11/15/2019, 10/31/2020   Moderna Sars-Covid-2 Vaccination 03/12/2019, 04/14/2019, 11/15/2019, 05/27/2020, 10/31/2020   Pneumococcal Conjugate-13 10/08/2018   Pneumococcal Polysaccharide-23 02/15/2020   Tdap 08/03/2008, 02/15/2013    Conditions to be addressed/monitored:  Hypertension, Hyperlipidemia, Diabetes, and GERD  Care Plan : [place]  Updates made by Lane Hacker, [place] since 12/05/2021 12:00 AM     Problem: DM, Lipids, Cardio, GERD   Priority: High  Onset Date: 01/01/2021     Long-Range Goal: Disease State Management   Start Date: 01/01/2021  Expected End Date: 01/01/2022  Recent Progress: On track  Priority: High  Note:   Current Barriers:  Does not maintain contact with provider office Does not contact provider office for questions/concerns  Pharmacist Clinical Goal(s):  Patient will contact provider office for questions/concerns as evidenced notation of same in electronic health record through collaboration with PharmD and provider.   Interventions: 1:1 collaboration with Rochel Brome, MD regarding development and update of comprehensive plan of care as evidenced by provider attestation and co-signature Inter-disciplinary care team collaboration (see longitudinal plan of care) Comprehensive medication review performed; medication list updated in electronic medical record  Hypertension (BP goal <140/90) BP Readings from Last 3 Encounters:  10/01/21 122/82  07/02/21 124/76  05/28/21 122/74  -Controlled -Current treatment: Atenolol/Chlorthalidone 100/25 QD Appropriate, Effective, Safe, Accessible Losartan 40m QD Appropriate, Effective, Safe, Accessible Kcl 158m Appropriate, Effective, Safe, Accessible -Medications previously tried: N/A  -Current home readings: Didn't have readings -Current dietary habits: "Tries to eat healthy" -Current exercise habits: Works as grEngineer, sitex/week for 5 hour shifts Denies hypotensive/hypertensive symptoms -Educated on BP goals and benefits of medications for prevention of heart attack, stroke and kidney damage; -Counseled to monitor BP at home PRN, document, and provide log at future appointments May 2023: Will ask PCP  to send in separate Atenolol/Chlorthalidone, this may cause her to hit [facility] sooner November 2023: Recommend maintain  therapy  Hyperlipidemia: (LDL goal < 70) The 10-year ASCVD risk score (Arnett DK, et al., 2019) is: 15.7%   Values used to calculate the score:     Age: 39 years     Sex: Female     Is Non-Hispanic African American: No     Diabetic: Yes     Tobacco smoker: No     Systolic Blood Pressure: 800 mmHg     Is BP treated: Yes     HDL Cholesterol: 52 mg/dL     Total Cholesterol: 147 mg/dL Lab Results  Component Value Date   CHOL 147 09/24/2021   CHOL 154 05/07/2021   CHOL 129 02/05/2021   Lab Results  Component Value Date   HDL 52 09/24/2021   HDL 56 05/07/2021   HDL 56 02/05/2021   Lab Results  Component Value Date   LDLCALC 67 09/24/2021   LDLCALC 72 05/07/2021   LDLCALC 54 02/05/2021   Lab Results  Component Value Date   TRIG 169 (H) 09/24/2021   TRIG 154 (H) 05/07/2021   TRIG 105 02/05/2021   Lab Results  Component Value Date   CHOLHDL 2.8 09/24/2021   CHOLHDL 2.8 05/07/2021   CHOLHDL 2.3 02/05/2021  No results found for: "LDLDIRECT" -Controlled -Current treatment: Rosuvastatin 26m Appropriate, Effective, Safe, Accessible -Medications previously tried: N/A  -Current dietary patterns: "Tries to eat healthy" -Current exercise habits: Works as gEngineer, site4x/week for 5 hour shifts -Educated on Cholesterol goals;  -Recommended to continue current medication  Diabetes (A1c goal <7%) Lab Results  Component Value Date   HGBA1C 5.9 (H) 09/24/2021   HGBA1C 5.9 (H) 05/07/2021   HGBA1C 6.3 (H) 02/05/2021   Lab Results  Component Value Date   LDLCALC 67 09/24/2021   CREATININE 0.94 10/01/2021   Lab Results  Component Value Date   NA 145 (H) 10/01/2021   K 4.5 10/01/2021   CREATININE 0.94 10/01/2021   EGFR 66 10/01/2021   GFRNONAA 74 02/21/2020   GLUCOSE 97 10/01/2021   Lab Results  Component Value Date   WBC 6.9 09/24/2021   HGB 13.3 09/24/2021   HCT 40.4 09/24/2021   MCV 88 09/24/2021   PLT 292 09/24/2021  -Controlled -Current medications: Victoza  1.831mday Appropriate, Effective, Safe, Accessible PAP Approved 2023 PAP submitted for 2024 on 12/04/21 -Medications previously tried: Metformin  -Current home glucose readings fasting glucose: Doesn't test post prandial glucose: Doesn't test -Denies hypoglycemic/hyperglycemic symptoms -Current exercise habits: Works as grEngineer, sitex/week for 5 hour shifts -Educated on A1c and blood sugar goals; -Counseled to check feet daily and get yearly eye exams May 2023: [facility] to afford [facility]pic[facility]. -Sent direct msg to Dr. CoTobie Poetnd LaElissa LovettPatient will use Victoza samples daily (Emphasized to taper up the dose) and we will try to get Trulicity PAP approved (Ozempic PAP's have been denied recently due to shortage). Gave patient my number and recommended she call me a week before samples run out, not the day before -Part time job: $360/2 weeks or $9,360/year SS: $1,469.40/month 17,628/year Total/year: $2$34,917-Husband: $2,482/month or $29,784/year Part Time: $496/2 weeks or $12,896/year Total/year: $42,680  -Total both yearly income: $6$91,505-PAP Max income for house of 2: $78,880 June 2023: PAP approved for Victoza for 2023. F/U November for 2023 -Recommended to continue current medication  GERD (Goal: minimize symptoms of reflux ) -  Controlled -Current treatment  Omeprazole 34m Appropriate, Effective, Safe, Accessible Calcium 6057m+ VitD3 (2051m Appropriate, Effective, Safe, Accessible -Medications previously tried: none reported  -Triggering factors: Daily occurrence, "Burning in my esophagus if I don't take it" -Hx of Bleeds/ulcers: No -Counseled on small meals, elevating head, and sleeping on left side -Recommended to continue current medication and to separate this and her Women's MVI. 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 States brings pain from 10->3 at night  Patient Goals/Self-Care Activities Patient will:  - take medications as prescribed as evidenced by patient report and record review  Follow Up Plan: The patient has been provided with contact information for the care management team and has been advised to call with any health related questions or concerns.   CPP F/U prn  [place] Constablem.D. - [phone]-- [phone]  Medication Assistance:  Victoza: -PAP approved 2023 -PAP being worked on for 2024  [facility] [place] [id number] [place] [address]: [phone] pill box? No -   Pt endorses 100% compliance  We discussed: Benefits of medication synchronization, packaging and delivery as well as enhanced pharmacist oversight with [facility]. Patient decided to: Utilize [facility] pharmacy for medication synchronization, packaging and delivery Verbal consent obtained for [facility] enhanced pharmacy services (medication synchronization, adherence packaging, delivery coordination). A medication sync plan was created to allow patient to get all medications delivered once every 30 to 90 days per patient preference. Patient understands they have freedom to choose pharmacy and clinical pharmacist will coordinate care between all prescribers  and [facility].   Care Plan and Follow Up Patient Decision:  Patient agrees to Care Plan and Follow-up.  Plan: The patient has been provided with contact information for the care management team and has been advised to call with any health related questions or concerns.   CPP F/U November 2023  [place] Constablem.Sherian Rein336-- [phone]

## 2021-12-05 NOTE — Patient Instructions (Signed)
Visit Information   Goals Addressed   None    Patient Care Plan: CCM Pharmacy Care Plan     Problem Identified: DM, Lipids, Cardio, GERD   Priority: High  Onset Date: 01/01/2021     Long-Range Goal: Disease State Management   Start Date: 01/01/2021  Expected End Date: 01/01/2022  Recent Progress: On track  Priority: High  Note:   Current Barriers:  Does not maintain contact with provider office Does not contact provider office for questions/concerns  Pharmacist Clinical Goal(s):  Patient will contact provider office for questions/concerns as evidenced notation of same in electronic health record through collaboration with PharmD and provider.   Interventions: 1:1 collaboration with Rochel Brome, MD regarding development and update of comprehensive plan of care as evidenced by provider attestation and co-signature Inter-disciplinary care team collaboration (see longitudinal plan of care) Comprehensive medication review performed; medication list updated in electronic medical record  Hypertension (BP goal <140/90) BP Readings from Last 3 Encounters:  10/01/21 122/82  07/02/21 124/76  05/28/21 122/74  -Controlled -Current treatment: Atenolol/Chlorthalidone 100/25 QD Appropriate, Effective, Safe, Accessible Losartan 50m QD Appropriate, Effective, Safe, Accessible Kcl 1103m Appropriate, Effective, Safe, Accessible -Medications previously tried: N/A  -Current home readings: Didn't have readings -Current dietary habits: "Tries to eat healthy" -Current exercise habits: Works as grEngineer, sitex/week for 5 hour shifts Denies hypotensive/hypertensive symptoms -Educated on BP goals and benefits of medications for prevention of heart attack, stroke and kidney damage; -Counseled to monitor BP at home PRN, document, and provide log at future appointments May 2023: Will ask PCP to send in separate Atenolol/Chlorthalidone, this [facility] Recommend  maintain therapy  Hyperlipidemia: (LDL goal < 70) The 10-year ASCVD risk score (Arnett DK, et al., 2019) is: 15.7%   Values used to calculate the score:     Age: 89ears     Sex: Female     Is Non-Hispanic African American: No     Diabetic: Yes     Tobacco smoker: No     Systolic Blood Pressure: 12478mHg     Is BP treated: Yes     HDL Cholesterol: 52 mg/dL     Total Cholesterol: 147 mg/dL Lab Results  Component Value Date   CHOL 147 09/24/2021   CHOL 154 05/07/2021   CHOL 129 02/05/2021   Lab Results  Component Value Date   HDL 52 09/24/2021   HDL 56 05/07/2021   HDL 56 02/05/2021   Lab Results  Component Value Date   LDLCALC 67 09/24/2021   LDLCALC 72 05/07/2021   LDLCALC 54 02/05/2021   Lab Results  Component Value Date   TRIG 169 (H) 09/24/2021   TRIG 154 (H) 05/07/2021   TRIG 105 02/05/2021   Lab Results  Component Value Date   CHOLHDL 2.8 09/24/2021   CHOLHDL 2.8 05/07/2021   CHOLHDL 2.3 02/05/2021  No results found for: "LDLDIRECT" -Controlled -Current treatment: Rosuvastatin 37m53mppropriate, Effective, Safe, Accessible -Medications previously tried: N/A  -Current dietary patterns: "Tries to eat healthy" -Current exercise habits: Works as groEngineer, site/week for 5 hour shifts -Educated on Cholesterol goals;  -Recommended to continue current medication  Diabetes (A1c goal <7%) Lab Results  Component Value Date   HGBA1C 5.9 (H) 09/24/2021   HGBA1C 5.9 (H) 05/07/2021   HGBA1C 6.3 (H) 02/05/2021   Lab Results  Component Value Date   LDLCALC 67 09/24/2021   CREATININE 0.94 10/01/2021   Lab Results  Component Value Date  NA 145 (H) 10/01/2021   K 4.5 10/01/2021   CREATININE 0.94 10/01/2021   EGFR 66 10/01/2021   GFRNONAA 74 02/21/2020   GLUCOSE 97 10/01/2021   Lab Results  Component Value Date   WBC 6.9 09/24/2021   HGB 13.3 09/24/2021   HCT 40.4 09/24/2021   MCV 88 09/24/2021   PLT 292 09/24/2021  -Controlled -Current  medications: Victoza 1.38m/day Appropriate, Effective, Safe, Accessible PAP Approved 2023 PAP submitted for 2024 on 12/04/21 -Medications previously tried: Metformin  -Current home glucose readings fasting glucose: Doesn't test post prandial glucose: Doesn't test -Denies hypoglycemic/hyperglycemic symptoms -Current exercise habits: Works as gEngineer, site4x/week for 5 hour shifts -Educated on A1c and blood sugar goals; -Counseled to check feet daily and get yearly eye exams May 2023: [facility] to afford Ozempic, [facility]. -Sent direct msg to Dr. CTobie Poetand LElissa Lovett Patient will use Victoza samples daily (Emphasized to taper up the dose) and we will try to get Trulicity PAP approved (Ozempic PAP's have been denied recently due to shortage). Gave patient my number and recommended she call me a week before samples run out, not the day before -Part time job: $360/2 weeks or $9,360/year SS: $1,469.40/month 17,628/year Total/year: $$41,740 -Husband: $2,482/month or $29,784/year Part Time: $496/2 weeks or $12,896/year Total/year: $42,680  -Total both yearly income: $$81,448 -PAP Max income for house of 2: $78,880 June 2023: PAP approved for Victoza for 2023. 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 States brings pain from 10->3 at night  Patient Goals/Self-Care Activities Patient will:  - take medications as prescribed as evidenced by patient report and record review  Follow Up Plan: The patient has been provided with contact information for the care management team and has been advised to call with any health related questions or concerns.   CPP F/U prn  Saharsh [place] Constable.D. - [phone]. Adinolfi was given information about Chronic Care Management services today including:  CCM service includes personalized support from designated clinical staff supervised by her physician, including individualized plan of care and coordination with other care providers 24/7 contact phone numbers for assistance for urgent and routine care needs. Standard insurance, coinsurance, copays and deductibles apply for chronic care management only during months in which we provide at least 20 minutes of these services. Most insurances cover these services at 100%, however patients may be responsible for  any copay, coinsurance and/or deductible if applicable. This service may help you avoid the need for more expensive face-to-face services. Only one practitioner may furnish and bill the service in a calendar month. The patient may stop CCM services at any time (effective at the end of the month) by phone call to the office staff.  Patient agreed to  services and verbal consent obtained.   The patient verbalized understanding of instructions, educational materials, and care plan provided today and DECLINED offer to receive copy of patient instructions, educational materials, and care plan.  The pharmacy team will reach out to the patient again over the next 60 days.   Lane Hacker, [place]

## 2021-12-13 DIAGNOSIS — E785 Hyperlipidemia, unspecified: Secondary | ICD-10-CM

## 2021-12-13 DIAGNOSIS — E1159 Type 2 diabetes mellitus with other circulatory complications: Secondary | ICD-10-CM

## 2021-12-13 DIAGNOSIS — I10 Essential (primary) hypertension: Secondary | ICD-10-CM

## 2021-12-13 DIAGNOSIS — Z7985 Long-term (current) use of injectable non-insulin antidiabetic drugs: Secondary | ICD-10-CM | POA: Diagnosis not present

## 2021-12-20 ENCOUNTER — Telehealth: Payer: Self-pay

## 2021-12-20 NOTE — Chronic Care Management (AMB) (Signed)
Faxed 2024 re-enrollment application to [employer] patient assistance for Victoza.   12/31/21- Tier exception forms faxed for celebrex, klorcon, zanaflex,  and clonazepam to [employer].    Billee Cashing, CMA Clinical Pharmacist Assistant [phone]

## 2021-12-24 ENCOUNTER — Telehealth: Payer: Medicare HMO

## 2021-12-27 ENCOUNTER — Telehealth: Payer: Medicare HMO

## 2021-12-27 ENCOUNTER — Ambulatory Visit (INDEPENDENT_AMBULATORY_CARE_PROVIDER_SITE_OTHER): Payer: Medicare HMO

## 2021-12-27 DIAGNOSIS — I152 Hypertension secondary to endocrine disorders: Secondary | ICD-10-CM

## 2021-12-27 NOTE — Chronic Care Management (AMB) (Signed)
Chronic Care Management   CCM RN Visit Note  12/27/2021 Name: Anna Greene MRN: [id number] DOB: 1955-06-17  Subjective: Anna Greene is a 66 y.o. year old female who is a primary care patient of Cox, Kirsten, MD. The patient was referred to the Chronic Care Management team for assistance with care management needs subsequent to provider initiation of CCM services and plan of care.    Today's Visit:  Engaged with patient by telephone for follow up visit.        Goals Addressed             This Visit's Progress    CCM Expected Outcome:  Monitor, Self-Manage and Reduce Symptoms of Diabetes       Current Barriers:  Care Coordination needs related to cost of Victoza and current pap in a patient with DM Chronic Disease Management support and education needs related to effective management of DM Financial Constraints.   Planned Interventions: Provided education to patient about basic DM disease process; Reviewed medications with patient and discussed importance of medication adherence. The patient continues to work with pharm D for Victoza PAP assistance. She has gotten for 2023 but has not heard from 2024 yet. ;        Reviewed prescribed diet with patient heart healthy/ADA diet. Review and education given ; Counseled on importance of regular laboratory monitoring as prescribed. New lab work scheduled for January;        Discussed plans with patient for ongoing care management follow up and provided patient with direct contact information for care management team;      Provided patient with written educational materials related to hypo and hyperglycemia and importance of correct treatment;       Reviewed scheduled/upcoming provider appointments including: 02-04-2022;         Advised patient, providing education and rationale, to check cbg when you have symptoms of low or high blood sugar and record        call provider for findings outside established parameters;       Referral made to  pharmacy team for assistance with medication questions and assistance with PAP for Victoza. Is on pill packaging system. She received the rest of her doses for 2023. She is currently waiting on information for approval for 2024.      Review of patient status, including review of consultants reports, relevant laboratory and other test results, and medications completed;       Advised patient to discuss changes in DM or chronic conditions with provider;      Screening for signs and symptoms of depression related to chronic disease state;        Assessed social determinant of health barriers;         Symptom Management: Take medications as prescribed   Attend all scheduled provider appointments Call pharmacy for medication refills 3-7 days in advance of running out of medications Call provider office for new concerns or questions  call the Suicide and Crisis Lifeline: 988 call the [place] National Suicide Prevention Lifeline: 347-754-7762 or TTY: 925-412-3164 TTY (416)634-3737) to talk to a trained counselor call 1-800-273-TALK (toll free, 24 hour hotline) if experiencing a Mental Health or Behavioral Health Crisis  check blood sugar at prescribed times: when you have symptoms of low or high blood sugar check feet daily for cuts, sores or redness trim toenails straight across wash and dry feet carefully every day wear comfortable, cotton socks wear comfortable, well-fitting shoes  Follow Up Plan: Telephone follow  up appointment with care management team member scheduled for: 02-21-2022 at 230  pm       CCM Expected Outcome:  Monitor, Self-Manage, and Reduce Symptoms of Hypertension       Current Barriers:  Chronic Disease Management support and education needs related to effective management of HTN Financial Constraints.  BP Readings from Last 3 Encounters:  10/01/21 122/82  07/02/21 124/76  05/28/21 122/74     Planned Interventions: Evaluation of current treatment plan related to  hypertension self management and patient's adherence to plan as established by provider. The patient denies any acute findings today. The patient states she is doing well and will see the pcp next month. No new concerns at this time;   Provided education to patient re: stroke prevention, s/s of heart attack and stroke; Reviewed prescribed diet heart healthy/ADA diet. Review and education. The patient is compliant with dietary restrictions Reviewed medications with patient and discussed importance of compliance. Patient is compliant with medications. Denies any medication needs related to her HTN and heart health;  Discussed plans with patient for ongoing care management follow up and provided patient with direct contact information for care management team; Reviewed scheduled/upcoming provider appointments including: 02-04-2022 at 1040 am. 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   Symptom Management: Take medications as prescribed   Attend all scheduled provider appointments Call pharmacy for medication refills 3-7 days in advance of running out of medications Call provider office for new concerns or questions  call the Suicide and Crisis Lifeline: 988 call the [place] National Suicide Prevention Lifeline: (873) 888-7491 or TTY: 480-044-5266 TTY (425)471-0776) to talk to a trained counselor call 1-800-273-TALK (toll free, 24 hour hotline) if experiencing a Mental Health or Behavioral Health Crisis   learn about high blood pressure call doctor for signs and symptoms of high blood pressure develop an action plan for high blood pressure keep all doctor appointments take medications for blood pressure exactly as prescribed report new symptoms to your doctor  Follow Up Plan: Telephone follow up appointment with care management team member scheduled for: 02-21-2022 at 230 pm          Plan:Telephone follow up appointment with care management team member scheduled for:  02-21-2022 at 230 pm  Alto Denver RN, MSN, CCM RN Care Manager  Chronic Care Management Direct Number: [phone]

## 2021-12-27 NOTE — Patient Instructions (Signed)
Please call the care guide team at [phone] if you need to cancel or reschedule your appointment.   If you are experiencing a Mental Health or Behavioral Health Crisis or need someone to talk to, please call the Suicide and Crisis Lifeline: 988 call the [place] National Suicide Prevention Lifeline: (854)651-6232 or TTY: 802-792-8531 TTY (206)219-6508) to talk to a trained counselor call 1-800-273-TALK (toll free, 24 hour hotline)   Following is a copy of your full provider care plan:   Goals Addressed             This Visit's Progress    CCM Expected Outcome:  Monitor, Self-Manage and Reduce Symptoms of Diabetes       Current Barriers:  Care Coordination needs related to cost of Victoza and current pap in a patient with DM Chronic Disease Management support and education needs related to effective management of DM Financial Constraints.   Planned Interventions: Provided education to patient about basic DM disease process; Reviewed medications with patient and discussed importance of medication adherence. The patient continues to work with pharm D for Victoza PAP assistance. She has gotten for 2023 but has not heard from 2024 yet. ;        Reviewed prescribed diet with patient heart healthy/ADA diet. Review and education given ; Counseled on importance of regular laboratory monitoring as prescribed. New lab work scheduled for January;        Discussed plans with patient for ongoing care management follow up and provided patient with direct contact information for care management team;      Provided patient with written educational materials related to hypo and hyperglycemia and importance of correct treatment;       Reviewed scheduled/upcoming provider appointments including: 02-04-2022;         Advised patient, providing education and rationale, to check cbg when you have symptoms of low or high blood sugar and record        call provider for findings outside established parameters;        Referral made to pharmacy team for assistance with medication questions and assistance with PAP for Victoza. Is on pill packaging system. She received the rest of her doses for 2023. She is currently waiting on information for approval for 2024.      Review of patient status, including review of consultants reports, relevant laboratory and other test results, and medications completed;       Advised patient to discuss changes in DM or chronic conditions with provider;      Screening for signs and symptoms of depression related to chronic disease state;        Assessed social determinant of health barriers;         Symptom Management: Take medications as prescribed   Attend all scheduled provider appointments Call pharmacy for medication refills 3-7 days in advance of running out of medications Call provider office for new concerns or questions  call the Suicide and Crisis Lifeline: 988 call the [place] National Suicide Prevention Lifeline: 251-651-4548 or TTY: 410-100-5345 TTY 661-575-7411) to talk to a trained counselor call 1-800-273-TALK (toll free, 24 hour hotline) if experiencing a Mental Health or Behavioral Health Crisis  check blood sugar at prescribed times: when you have symptoms of low or high blood sugar check feet daily for cuts, sores or redness trim toenails straight across wash and dry feet carefully every day wear comfortable, cotton socks wear comfortable, well-fitting shoes  Follow Up Plan: Telephone follow up appointment with care  management team member scheduled for: 02-21-2022 at 230  pm       CCM Expected Outcome:  Monitor, Self-Manage, and Reduce Symptoms of Hypertension       Current Barriers:  Chronic Disease Management support and education needs related to effective management of HTN Financial Constraints.  BP Readings from Last 3 Encounters:  10/01/21 122/82  07/02/21 124/76  05/28/21 122/74     Planned Interventions: Evaluation of current treatment  plan related to hypertension self management and patient's adherence to plan as established by provider. The patient denies any acute findings today. The patient states she is doing well and will see the pcp next month. No new concerns at this time;   Provided education to patient re: stroke prevention, s/s of heart attack and stroke; Reviewed prescribed diet heart healthy/ADA diet. Review and education. The patient is compliant with dietary restrictions Reviewed medications with patient and discussed importance of compliance. Patient is compliant with medications. Denies any medication needs related to her HTN and heart health;  Discussed plans with patient for ongoing care management follow up and provided patient with direct contact information for care management team; Reviewed scheduled/upcoming provider appointments including: 02-04-2022 at 1040 am. 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   Symptom Management: Take medications as prescribed   Attend all scheduled provider appointments Call pharmacy for medication refills 3-7 days in advance of running out of medications Call provider office for new concerns or questions  call the Suicide and Crisis Lifeline: 988 call the [place] National Suicide Prevention Lifeline: (480)882-0453 or TTY: 743-276-3840 TTY (804)569-6372) to talk to a trained counselor call 1-800-273-TALK (toll free, 24 hour hotline) if experiencing a Mental Health or Behavioral  Health Crisis  learn about high blood pressure call doctor for signs and symptoms of high blood pressure develop an action plan for high blood pressure keep all doctor appointments take medications for blood pressure exactly as prescribed report new symptoms to your doctor  Follow Up Plan: Telephone follow up appointment with care management team member scheduled for: 02-21-2022 at 230 pm          Patient verbalizes understanding of instructions and care plan provided today and agrees to view in MyChart. Active MyChart status and patient understanding of how to access instructions and care plan via MyChart confirmed with patient.     Telephone follow up appointment with care management team member scheduled for: 02-21-2022 at 230 pm

## 2021-12-31 DIAGNOSIS — M545 Low back pain, unspecified: Secondary | ICD-10-CM | POA: Diagnosis not present

## 2022-01-01 ENCOUNTER — Other Ambulatory Visit: Payer: Self-pay | Admitting: Family Medicine

## 2022-01-01 DIAGNOSIS — F411 Generalized anxiety disorder: Secondary | ICD-10-CM

## 2022-01-03 ENCOUNTER — Other Ambulatory Visit: Payer: Self-pay

## 2022-01-03 ENCOUNTER — Telehealth: Payer: Self-pay

## 2022-01-03 DIAGNOSIS — I152 Hypertension secondary to endocrine disorders: Secondary | ICD-10-CM

## 2022-01-03 MED ORDER — ATENOLOL-CHLORTHALIDONE 100-25 MG PO TABS: 1.0000 | ORAL_TABLET | Freq: Every day | ORAL | 1 refills | Status: DC

## 2022-01-03 NOTE — Progress Notes (Signed)
Chronic Care Management Pharmacy Assistant   Name: Anna Greene  MRN: [id number] DOB: December 24, 1955   Reason for Encounter: Medication Coordination for [facility]    Recent office visits:  None  Recent consult visits:  None  Hospital visits:  None  Medications: Outpatient Encounter Medications as of 01/03/2022  Medication Sig   atenolol-chlorthalidone (TENORETIC) 100-25 MG tablet Take 1 tablet by mouth daily.   celecoxib (CELEBREX) 200 MG capsule Take 1 capsule (200 mg total) by mouth 2 (two) times daily.   cetirizine (ZYRTEC) 10 MG tablet Take 1 tablet (10 mg total) by mouth daily.   clonazePAM (KLONOPIN) 1 MG tablet TAKE ONE TABLET BY MOUTH AT bedtime AS NEEDED FOR ANXIETY   Insulin Pen Needle 32G X 6 MM MISC 1 each by Does not apply route daily. Use with victoza samples   liraglutide (VICTOZA) 18 MG/3ML SOPN Inject 1.8 mg into the skin daily. PAP Approved 2023   losartan (COZAAR) 50 MG tablet Take 1 tablet (50 mg total) by mouth daily.   omeprazole (PRILOSEC) 20 MG capsule TAKE ONE CAPSULE BY MOUTH EVERY MORNING   potassium chloride (KLOR-CON) 10 MEQ tablet TAKE ONE TABLET BY MOUTH EVERY MORNING   rosuvastatin (CRESTOR) 5 MG tablet Take 1 tablet (5 mg total) by mouth daily.   terbinafine (LAMISIL) 250 MG tablet Take 1 tablet (250 mg total) by mouth daily.   tiZANidine (ZANAFLEX) 4 MG tablet Take 1 tablet (4 mg total) by mouth every 8 (eight) hours as needed for muscle spasms.   No facility-administered encounter medications on file as of 01/03/2022.    Reviewed chart for medication changes ahead of medication coordination call.  No OVs, Consults, or hospital visits since last care coordination call/Pharmacist visit.   No medication changes indicated OR if recent visit, treatment plan here.  BP Readings from Last 3 Encounters:  10/01/21 122/82  07/02/21 124/76  05/28/21 122/74    Lab Results  Component Value Date   HGBA1C 5.9 (H) 09/24/2021     Patient obtains  medications through Adherence Packaging  30 Days   Last adherence delivery included:  Clonazepam 1 mg at bedtime PRN- Vials Calcium/Vitamin D3 600-800 1 at breakfast and 1 at evening meal Cetirizine 10mg  1 at evening meal Potassium Chloride Er 1 at Breakfast Omeprazole 20mg  1 at breakfast Losartan 50mg  1 at breakfast Atenolol-Chlorthalidone 100-25mg  1 at breakfast Celecoxib 200mg  1 at breakfast and 1 at evening meal Rosuvastatin 5mg  1 at evening meal Terbinafine HCI 250mg  1 at breakfast  Tizanidine 4mg -1 tab every 8 hours as needed- Vials  Patient declined (meds) last month  Pen Needles 32g Victoza 18mg /71ml - Gets PAP   Patient is due for next adherence delivery on: 01/17/22. Called patient and reviewed medications and coordinated delivery.  This delivery to include: Clonazepam 1 mg at bedtime PRN- Vials Calcium/Vitamin D3 600-800 1 at breakfast and 1 at evening meal Cetirizine 10mg  1 at evening meal Potassium Chloride Er 1 at Breakfast Omeprazole 20mg  1 at breakfast Losartan 50mg  1 at breakfast Atenolol-Chlorthalidone 100-25mg  1 at breakfast Celecoxib 200mg  1 at breakfast and 1 at evening meal Rosuvastatin 5mg  1 at evening meal Terbinafine HCI 250mg  1 at breakfast  Tizanidine 4mg -1 tab every 8 hours as needed- Vials  Patient declined the following medications  Pen Needles 32g-Filled on 11/14/21 100ds Victoza 18mg /68ml - Gets PAP- Last shipment 12/17/21 60ds  Patient needs refills-Requested  Atenolol-Chlorthalidone 100-25mg    Confirmed delivery date of 01/17/22, advised patient that pharmacy will  contact them the morning of delivery.   Elray Mcgregor, [facility] Pharmacist Assistant  [phone]

## 2022-01-13 DIAGNOSIS — I10 Essential (primary) hypertension: Secondary | ICD-10-CM | POA: Diagnosis not present

## 2022-01-13 DIAGNOSIS — E1159 Type 2 diabetes mellitus with other circulatory complications: Secondary | ICD-10-CM | POA: Diagnosis not present

## 2022-01-21 DIAGNOSIS — M5416 Radiculopathy, lumbar region: Secondary | ICD-10-CM | POA: Diagnosis not present

## 2022-01-28 ENCOUNTER — Ambulatory Visit: Payer: Medicare HMO

## 2022-01-28 DIAGNOSIS — E1159 Type 2 diabetes mellitus with other circulatory complications: Secondary | ICD-10-CM | POA: Diagnosis not present

## 2022-01-28 DIAGNOSIS — E782 Mixed hyperlipidemia: Secondary | ICD-10-CM

## 2022-01-28 DIAGNOSIS — E1169 Type 2 diabetes mellitus with other specified complication: Secondary | ICD-10-CM

## 2022-01-28 DIAGNOSIS — I152 Hypertension secondary to endocrine disorders: Secondary | ICD-10-CM | POA: Diagnosis not present

## 2022-01-29 LAB — CBC WITH DIFFERENTIAL/PLATELET
Basophils Absolute: 0.1 10*3/uL (ref 0.0–0.2)
Basos: 1 %
EOS (ABSOLUTE): 0.3 10*3/uL (ref 0.0–0.4)
Eos: 4 %
Hematocrit: 39.5 % (ref 34.0–46.6)
Hemoglobin: 12.9 g/dL (ref 11.1–15.9)
Immature Grans (Abs): 0.1 10*3/uL (ref 0.0–0.1)
Immature Granulocytes: 1 %
Lymphocytes Absolute: 2.4 10*3/uL (ref 0.7–3.1)
Lymphs: 34 %
MCH: 28.4 pg (ref 26.6–33.0)
MCHC: 32.7 g/dL (ref 31.5–35.7)
MCV: 87 fL (ref 79–97)
Monocytes Absolute: 0.7 10*3/uL (ref 0.1–0.9)
Monocytes: 10 %
Neutrophils Absolute: 3.5 10*3/uL (ref 1.4–7.0)
Neutrophils: 50 %
Platelets: 297 10*3/uL (ref 150–450)
RBC: 4.55 x10E6/uL (ref 3.77–5.28)
RDW: 13.1 % (ref 11.7–15.4)
WBC: 7 10*3/uL (ref 3.4–10.8)

## 2022-01-29 LAB — COMPREHENSIVE METABOLIC PANEL
ALT: 19 IU/L (ref 0–32)
AST: 21 IU/L (ref 0–40)
Albumin/Globulin Ratio: 1.8 (ref 1.2–2.2)
Albumin: 4.2 g/dL (ref 3.9–4.9)
Alkaline Phosphatase: 93 IU/L (ref 44–121)
BUN/Creatinine Ratio: 21 (ref 12–28)
BUN: 21 mg/dL (ref 8–27)
Bilirubin Total: 0.3 mg/dL (ref 0.0–1.2)
CO2: 26 mmol/L (ref 20–29)
Calcium: 9.7 mg/dL (ref 8.7–10.3)
Chloride: 96 mmol/L (ref 96–106)
Creatinine, Ser: 0.99 mg/dL (ref 0.57–1.00)
Globulin, Total: 2.4 g/dL (ref 1.5–4.5)
Glucose: 101 mg/dL — ABNORMAL HIGH (ref 70–99)
Potassium: 4.4 mmol/L (ref 3.5–5.2)
Sodium: 138 mmol/L (ref 134–144)
Total Protein: 6.6 g/dL (ref 6.0–8.5)
eGFR: 62 mL/min/{1.73_m2} (ref >59)

## 2022-01-29 LAB — HEMOGLOBIN A1C
Est. average glucose Bld gHb Est-mCnc: 128 mg/dL
Hgb A1c MFr Bld: 6.1 % — ABNORMAL HIGH (ref 4.8–5.6)

## 2022-01-29 LAB — LIPID PANEL
Chol/HDL Ratio: 3.2 ratio (ref 0.0–4.4)
Cholesterol, Total: 170 mg/dL (ref 100–199)
HDL: 53 mg/dL (ref >39)
LDL Chol Calc (NIH): 84 mg/dL (ref 0–99)
Triglycerides: 198 mg/dL — ABNORMAL HIGH (ref 0–149)
VLDL Cholesterol Cal: 33 mg/dL (ref 5–40)

## 2022-01-29 LAB — CARDIOVASCULAR RISK ASSESSMENT

## 2022-01-29 LAB — TSH: TSH: 0.948 u[IU]/mL (ref 0.450–4.500)

## 2022-02-04 ENCOUNTER — Ambulatory Visit (INDEPENDENT_AMBULATORY_CARE_PROVIDER_SITE_OTHER): Payer: Medicare HMO | Admitting: Family Medicine

## 2022-02-04 VITALS — BP 128/78 | HR 78 | Temp 97.0°F | Resp 18 | Ht 66.0 in | Wt 211.0 lb

## 2022-02-04 DIAGNOSIS — B351 Tinea unguium: Secondary | ICD-10-CM | POA: Diagnosis not present

## 2022-02-04 DIAGNOSIS — E782 Mixed hyperlipidemia: Secondary | ICD-10-CM | POA: Diagnosis not present

## 2022-02-04 DIAGNOSIS — E1159 Type 2 diabetes mellitus with other circulatory complications: Secondary | ICD-10-CM | POA: Diagnosis not present

## 2022-02-04 DIAGNOSIS — I152 Hypertension secondary to endocrine disorders: Secondary | ICD-10-CM | POA: Diagnosis not present

## 2022-02-04 DIAGNOSIS — F411 Generalized anxiety disorder: Secondary | ICD-10-CM | POA: Diagnosis not present

## 2022-02-04 DIAGNOSIS — E1169 Type 2 diabetes mellitus with other specified complication: Secondary | ICD-10-CM

## 2022-02-04 DIAGNOSIS — M5136 Other intervertebral disc degeneration, lumbar region: Secondary | ICD-10-CM

## 2022-02-04 DIAGNOSIS — F5102 Adjustment insomnia: Secondary | ICD-10-CM

## 2022-02-04 DIAGNOSIS — R69 Illness, unspecified: Secondary | ICD-10-CM | POA: Diagnosis not present

## 2022-02-04 MED ORDER — OMEGA-3-ACID ETHYL ESTERS 1 G PO CAPS: 2.0000 g | ORAL_CAPSULE | Freq: Two times a day (BID) | ORAL | 0 refills | Status: DC

## 2022-02-04 NOTE — Progress Notes (Unsigned)
Subjective:  Patient ID: Anna Greene, female    DOB: Apr 05, 1955  Age: 67 y.o. MRN: [id number]  Chief Complaint  Patient presents with   Hypertension   Hyperlipidemia   Diabetes    HPI Diabetes:  Complications: Hyperlipidemia, hypertension Glucose checking: No Glucose logs: No Hypoglycemia: She is not sure Most recent A1C: 5.9% Current medications: Victoza 1.8 mg daily. Last Eye Exam: July 2022 Foot checks: Daily  Hyperlipidemia: Current medications: Rosuvastatin 5 mg daily.  Hypertension: Complications: Diabetes Current medications: Atenolol-chlorthalidone 100-25 mg daily, Losartan 50 mg daily.   GAD:  Taking klonopin 0.5 mg daily PRN. Usually   Onychomycosis: She is taking terbinafine 250 mg daily.  GERD: Taking Omeprazole 20 mg every morning.  Insomnia: Patient is on clonazepam 0.5 mg nightly.    Runny nose since Saturday, mild scratchy throat and nasal congestion.       02/04/2022   10:39 AM 10/01/2021    1:32 PM 05/28/2021    2:31 PM 05/14/2021    8:23 AM 11/13/2020   10:59 AM  Depression screen PHQ 2/9  Decreased Interest 0 0 0 0 0  Down, Depressed, Hopeless 0 0 0 0 0  PHQ - 2 Score 0 0 0 0 0  Altered sleeping  0 2 2 0  Tired, decreased energy  0 0 0 0  Change in appetite  0 0 0 0  Feeling bad or failure about yourself   0 0 0 0  Trouble concentrating  0 0 0 0  Moving slowly or fidgety/restless  0 0 0 0  Suicidal thoughts  0 0 0 0  PHQ-9 Score  0 2 2 0  Difficult doing work/chores  Not difficult at all   Not difficult at all         11/13/2020   10:59 AM 05/24/2021    1:41 PM 05/28/2021   11:32 AM 11/12/2021    3:18 PM 02/04/2022   10:39 AM  Fall Risk  Falls in the past year? 0 0 0 0 0  Was there an injury with Fall? 0  0 0 0  Fall Risk Category Calculator 0  0 0 0  Fall Risk Category (Retired) Low  Low Low   (RETIRED) Patient Fall Risk Level Low fall risk  Low fall risk Low fall risk   Patient at Risk for Falls Due to No Fall Risks  No Fall Risks  No Fall Risks No Fall Risks  Fall risk Follow up Falls evaluation completed  Falls evaluation completed;Education provided;Falls prevention discussed Falls evaluation completed Falls evaluation completed      Review of Systems  Constitutional:  Negative for chills, fatigue and fever.  HENT:  Positive for congestion and postnasal drip. Negative for rhinorrhea and sore throat.   Respiratory:  Negative for cough and shortness of breath.   Cardiovascular:  Negative for chest pain.  Gastrointestinal:  Negative for abdominal pain, constipation, diarrhea, nausea and vomiting.  Genitourinary:  Negative for dysuria and urgency.  Musculoskeletal:  Negative for back pain and myalgias.  Neurological:  Negative for dizziness, weakness, light-headedness and headaches.  Psychiatric/Behavioral:  Negative for dysphoric mood. The patient is not nervous/anxious.     Current Outpatient Medications on File Prior to Visit  Medication Sig Dispense Refill   atenolol-chlorthalidone (TENORETIC) 100-25 MG tablet Take 1 tablet by mouth daily. 90 tablet 1   celecoxib (CELEBREX) 200 MG capsule Take 1 capsule (200 mg total) by mouth 2 (two) times daily. 180 capsule 1  cetirizine (ZYRTEC) 10 MG tablet Take 1 tablet (10 mg total) by mouth daily. 90 tablet 3   clonazePAM (KLONOPIN) 1 MG tablet TAKE ONE TABLET BY MOUTH AT bedtime AS NEEDED FOR ANXIETY 30 tablet 2   Insulin Pen Needle 32G X 6 MM MISC 1 each by Does not apply route daily. Use with victoza samples 30 each 2   liraglutide (VICTOZA) 18 MG/3ML SOPN Inject 1.8 mg into the skin daily. PAP Approved 2023     losartan (COZAAR) 50 MG tablet Take 1 tablet (50 mg total) by mouth daily. 90 tablet 1   omeprazole (PRILOSEC) 20 MG capsule TAKE ONE CAPSULE BY MOUTH EVERY MORNING 90 capsule 1   potassium chloride (KLOR-CON) 10 MEQ tablet TAKE ONE TABLET BY MOUTH EVERY MORNING 90 tablet 1   rosuvastatin (CRESTOR) 5 MG tablet Take 1 tablet (5 mg total) by mouth daily. 90  tablet 1   tiZANidine (ZANAFLEX) 4 MG tablet Take 1 tablet (4 mg total) by mouth every 8 (eight) hours as needed for muscle spasms. 270 tablet 1   No current facility-administered medications on file prior to visit.   Past Medical History:  Diagnosis Date   Benign fasciculation-cramp syndrome    Depression    Diabetes mellitus without complication (HCC)    GERD (gastroesophageal reflux disease)    Hyperlipidemia    Hypertension    Nephrolithiasis    Osteoarthritis    Bilateral knees, degenerative disc disease.  Patient has had epidural steroid injections x2   Plantar fasciitis    Past Surgical History:  Procedure Laterality Date   BREAST REDUCTION SURGERY Bilateral [place] ARTHROSCOPY W/ MENISCAL REPAIR Left 1995   Per patient muscle was repaired   KNEE ARTHROSCOPY W/ MENISCAL REPAIR Right 2003   knee surgery Left 10/2020   meniscal repair via arthroscopy   PANNICULECTOMY     REDUCTION MAMMAPLASTY  1996   REPLACEMENT TOTAL KNEE Right 2019   TONSILLECTOMY  1960    Family History  Problem Relation Age of Onset   Stroke Mother    Heart disease Mother    Cancer Father    Diabetes Sister    Anxiety disorder Sister    Depression Sister    Diabetes Sister    Diabetes Sister    Arthritis Sister    Stroke Brother    Breast cancer Neg Hx    Social History   Socioeconomic History   Marital status: Married    Spouse name: Ronnie   Number of children: 2   Years of education: Not on file   Highest education level: Not on file  Occupational History   Occupation: Teacher, music   Occupation: Retired Corporate treasurer  Tobacco Use   Smoking status: Never   Smokeless tobacco: Never  Scientific laboratory technician Use: Never used  Substance and Sexual Activity   Alcohol use: Never   Drug use: Never   Sexual activity: Yes    Partners: Male  Other Topics Concern   Not on file  Social History Narrative   Patient is married.   Has 2 adult children that live in [place] (where she is from)   Social Determinants of Health   Financial Resource Strain: Medium Risk (12/05/2021)   Overall Financial Resource Strain (CARDIA)    Difficulty of Paying Living Expenses: Somewhat hard  Food Insecurity: No Food Insecurity (11/12/2021)   Hunger Vital Sign  Worried About Programme researcher, broadcasting/film/video in the Last Year: Never true    Ran Out of Food in the Last Year: Never true  Transportation Needs: No Transportation Needs (12/05/2021)   PRAPARE - Administrator, Civil Service (Medical): No    Lack of Transportation (Non-Medical): No  Physical Activity: Insufficiently Active (05/24/2021)   Exercise Vital Sign    Days of Exercise per Week: 3 days    Minutes of Exercise per Session: 40 min  Stress: No Stress Concern Present (05/24/2021)   [employer] of Occupational Health - Occupational Stress Questionnaire    Feeling of Stress : Only a little  Social Connections: Moderately Isolated (11/12/2021)   Social Connection and Isolation Panel [NHANES]    Frequency of Communication with Friends and Family: More than three times a week    Frequency of Social Gatherings with Friends and Family: Twice a week    Attends Religious Services: Never    Diplomatic Services operational officer: No    Attends Engineer, structural: Never    Marital Status: Married    Objective:  BP 128/78   Pulse 78   Temp (!) 97 F (36.1 C)   Resp 18   Ht 5\' 6"  (1.676 m)   Wt 211 lb (95.7 kg)   LMP  (LMP Unknown)   BMI 34.06 kg/m      02/04/2022   10:34 AM 10/01/2021    1:27 PM 07/02/2021    1:23 PM  BP/Weight  Systolic BP 128 122 124  Diastolic BP 78 82 76  Wt. (Lbs) 211 209 210  BMI 34.06 kg/m2 33.73 kg/m2 33.89 kg/m2    Physical Exam Vitals reviewed.  Constitutional:      Appearance: Normal appearance. She is normal weight.  Neck:     Vascular: No carotid bruit.  Cardiovascular:     Rate and Rhythm: Normal rate and  regular rhythm.     Heart sounds: Normal heart sounds.  Pulmonary:     Effort: Pulmonary effort is normal. No respiratory distress.     Breath sounds: Normal breath sounds.  Abdominal:     General: Abdomen is flat. Bowel sounds are normal.     Palpations: Abdomen is soft.     Tenderness: There is no abdominal tenderness.  Neurological:     Mental Status: She is alert and oriented to person, place, and time.  Psychiatric:        Mood and Affect: Mood normal.        Behavior: Behavior normal.     Diabetic Foot Exam - Simple   Simple Foot Form  02/04/2022 10:11 PM  Visual Inspection See comments: Yes Sensation Testing Intact to touch and monofilament testing bilaterally: Yes Pulse Check Posterior Tibialis and Dorsalis pulse intact bilaterally: Yes Comments Onychomycosis. Thickened nails.       Lab Results  Component Value Date   WBC 7.0 01/28/2022   HGB 12.9 01/28/2022   HCT 39.5 01/28/2022   PLT 297 01/28/2022   GLUCOSE 101 (H) 01/28/2022   CHOL 170 01/28/2022   TRIG 198 (H) 01/28/2022   HDL 53 01/28/2022   LDLCALC 84 01/28/2022   ALT 19 01/28/2022   AST 21 01/28/2022   NA 138 01/28/2022   K 4.4 01/28/2022   CL 96 01/28/2022   CREATININE 0.99 01/28/2022   BUN 21 01/28/2022   CO2 26 01/28/2022   TSH 0.948 01/28/2022   HGBA1C 6.1 (H) 01/28/2022  Assessment & Plan:    Hypertension associated with type 2 diabetes mellitus (HCC) Well controlled.  No changes to medicines. Atenolol-chlorthalidone 100-25 mg daily, Losartan 50 mg daily.  Continue to work on eating a healthy diet and exercise.    Mixed diabetic hyperlipidemia associated with type 2 diabetes mellitus (HCC) Control: Good Recommend check sugars fasting daily. Recommend check feet daily. Recommend annual eye exams. Medicines: Victoza 1.8 mg daily.  Continue to work on eating a healthy diet and exercise.  Labs drawn today.     Onychomycosis The current medical regimen is effective;  continue  present plan and medications.    Degenerative disc disease, lumbar Continue with Celebrex  Insomnia The current medical regimen is effective;  continue present plan and medications.  clonazepam 0.5 mg nightly.     Hyperlipidemia Well controlled.  No changes to medicines. Rosuvastatin 5 mg daily.  Continue to work on eating a healthy diet and exercise.    GAD (generalized anxiety disorder) The current medical regimen is effective;  continue present plan and medications.  klonopin 0.5 mg daily PRN     Meds ordered this encounter  Medications   omega-3 acid ethyl esters (LOVAZA) 1 g capsule    Sig: Take 2 capsules (2 g total) by mouth 2 (two) times daily.    Dispense:  180 capsule    Refill:  0    Orders Placed This Encounter  Procedures   Microalbumin / creatinine urine ratio     Follow-up: Return in about 4 months (around 06/05/2022) for chronic follow up, lab visit.  An After Visit Summary was printed and given to the patient.  I,Mace Weinberg,acting as a Neurosurgeon for Blane Ohara, MD.,have documented all relevant documentation on the behalf of Blane Ohara, MD,as directed by  Blane Ohara, MD while in the presence of Blane Ohara, MD.   Blane Ohara, MD [facility] [phone]

## 2022-02-05 LAB — MICROALBUMIN / CREATININE URINE RATIO
Creatinine, Urine: 319.6 mg/dL
Microalb/Creat Ratio: 4 mg/g creat (ref 0–29)
Microalbumin, Urine: 13.6 ug/mL

## 2022-02-07 ENCOUNTER — Other Ambulatory Visit: Payer: Self-pay | Admitting: Family Medicine

## 2022-02-08 ENCOUNTER — Telehealth: Payer: Self-pay

## 2022-02-08 NOTE — Progress Notes (Signed)
Care Management & Coordination Services Pharmacy Team  Reason for Encounter: Medication coordination and delivery  Contacted patient to discuss medications and coordinate delivery from [facility]. Spoke with patient on 02/08/2022  C[facility] dispensing form sent to Anna Greene  for review.   Last adherence delivery date: 01-042024  Patient is due for next adherence delivery on: 02-18-2022  This delivery to include: Adherence Packaging  30 Days  Clonazepam 1 mg at bedtime PRN- Vials Calcium/Vitamin D3 600-800 1 at breakfast and 1 at evening meal Cetirizine 10mg  1 at evening meal Potassium Chloride Er 45meq 1 at Breakfast Omeprazole 20mg  1 at breakfast Losartan 50mg  1 at breakfast Atenolol-Chlorthalidone 100-25mg  1 at breakfast Celecoxib 200mg  1 at breakfast and 1 at evening meal Rosuvastatin 5mg  1 at evening meal Terbinafine HCI 250mg  1 at breakfast  Tizanidine 4mg -1 tab every 8 hours as needed- Vials Pen Needles 32g  Omega 3 1 gm 2 at B 2 with EM  Patient declined the following medications this month: Terbinafine- D/C Victoza 18mg /18ml - Gets PAP     No refill request needed.  Confirmed delivery date of 02-18-2022, advised patient that pharmacy will contact them the morning of delivery.   Any concerns about your medications? No  How often do you forget or accidentally miss a dose? Never  Do you use a pillbox? No  Is patient in packaging Yes  If yes  What is the date on your next pill pack? 02-09-2022  Any concerns or issues with your packaging? No   Recent blood pressure readings are as follows: 128/78 02-04-2022  Recent blood glucose readings are as follows: None patient stated PCP said she doesn't have   Chart review: Recent office visits:  02-04-2022 Anna Brome, MD. STOP terbinafine. START omega 3 2 g twice daily.  01-28-2022 Anna Brome, MD. Lab visit. Glucose= 101. A1C= 6.1. Trig= 198  Recent consult visits:  None  Hospital visits:  None in  previous 6 months  Medications: Outpatient Encounter Medications as of 02/08/2022  Medication Sig   atenolol-chlorthalidone (TENORETIC) 100-25 MG tablet Take 1 tablet by mouth daily.   celecoxib (CELEBREX) 200 MG capsule Take 1 capsule (200 mg total) by mouth 2 (two) times daily.   cetirizine (ZYRTEC) 10 MG tablet Take 1 tablet (10 mg total) by mouth daily.   clonazePAM (KLONOPIN) 1 MG tablet TAKE ONE TABLET BY MOUTH AT bedtime AS NEEDED FOR ANXIETY   Insulin Pen Needle 32G X 6 MM MISC 1 each by Does not apply route daily. Use with victoza samples   liraglutide (VICTOZA) 18 MG/3ML SOPN Inject 1.8 mg into the skin daily. PAP Approved 2023   losartan (COZAAR) 50 MG tablet Take 1 tablet (50 mg total) by mouth daily.   omega-3 acid ethyl esters (LOVAZA) 1 g capsule Take 2 capsules (2 g total) by mouth 2 (two) times daily.   omeprazole (PRILOSEC) 20 MG capsule TAKE ONE CAPSULE BY MOUTH EVERY MORNING   potassium chloride (KLOR-CON) 10 MEQ tablet TAKE ONE TABLET BY MOUTH EVERY MORNING   rosuvastatin (CRESTOR) 5 MG tablet Take 1 tablet (5 mg total) by mouth daily.   tiZANidine (ZANAFLEX) 4 MG tablet Take 1 tablet (4 mg total) by mouth every 8 (eight) hours as needed for muscle spasms.   No facility-administered encounter medications on file as of 02/08/2022.   BP Readings from Last 3 Encounters:  02/04/22 128/78  10/01/21 122/82  07/02/21 124/76    Pulse Readings from Last 3 Encounters:  02/04/22 78  10/01/21 80  07/02/21 76    Lab Results  Component Value Date/Time   HGBA1C 6.1 (H) 01/28/2022 09:24 AM   HGBA1C 5.9 (H) 09/24/2021 08:50 AM   Lab Results  Component Value Date   CREATININE 0.99 01/28/2022   BUN 21 01/28/2022   GFRNONAA 74 02/21/2020   GFRAA 85 02/21/2020   NA 138 01/28/2022   K 4.4 01/28/2022   CALCIUM 9.7 01/28/2022   CO2 26 01/28/2022     [place] Pharmacist Assistant [phone]

## 2022-02-09 NOTE — Assessment & Plan Note (Signed)
The current medical regimen is effective;  continue present plan and medications.  clonazepam 0.5 mg nightly.

## 2022-02-09 NOTE — Assessment & Plan Note (Signed)
Well controlled.  No changes to medicines. Rosuvastatin 5 mg daily. Continue to work on eating a healthy diet and exercise.   

## 2022-02-09 NOTE — Assessment & Plan Note (Signed)
Continue with Celebrex

## 2022-02-09 NOTE — Assessment & Plan Note (Signed)
The current medical regimen is effective;  continue present plan and medications.  klonopin 0.5 mg daily PRN

## 2022-02-09 NOTE — Assessment & Plan Note (Signed)
The current medical regimen is effective;  continue present plan and medications.  

## 2022-02-09 NOTE — Assessment & Plan Note (Addendum)
Well controlled.  No changes to medicines. Atenolol-chlorthalidone 100-25 mg daily, Losartan 50 mg daily.  Continue to work on eating a healthy diet and exercise.

## 2022-02-09 NOTE — Assessment & Plan Note (Signed)
Control: Good Recommend check sugars fasting daily. Recommend check feet daily. Recommend annual eye exams. Medicines: Victoza 1.8 mg daily.  Continue to work on eating a healthy diet and exercise.  Labs drawn today.

## 2022-02-10 ENCOUNTER — Encounter: Payer: Self-pay | Admitting: Family Medicine

## 2022-02-11 DIAGNOSIS — M5416 Radiculopathy, lumbar region: Secondary | ICD-10-CM | POA: Diagnosis not present

## 2022-02-12 ENCOUNTER — Other Ambulatory Visit: Payer: Self-pay | Admitting: Family Medicine

## 2022-02-12 ENCOUNTER — Other Ambulatory Visit: Payer: Self-pay

## 2022-02-12 MED ORDER — INSULIN PEN NEEDLE 32G X 6 MM MISC: 1.0000 | Freq: Every day | 2 refills | Status: DC

## 2022-02-14 ENCOUNTER — Encounter: Payer: Self-pay | Admitting: Nurse Practitioner

## 2022-02-14 ENCOUNTER — Ambulatory Visit (INDEPENDENT_AMBULATORY_CARE_PROVIDER_SITE_OTHER): Payer: Medicare HMO | Admitting: Nurse Practitioner

## 2022-02-14 VITALS — BP 132/70 | HR 82 | Temp 98.4°F | Resp 14 | Ht 66.0 in | Wt 209.0 lb

## 2022-02-14 DIAGNOSIS — R051 Acute cough: Secondary | ICD-10-CM | POA: Diagnosis not present

## 2022-02-14 DIAGNOSIS — J0100 Acute maxillary sinusitis, unspecified: Secondary | ICD-10-CM | POA: Insufficient documentation

## 2022-02-14 LAB — POCT INFLUENZA A/B
Influenza A, POC: NEGATIVE
Influenza B, POC: NEGATIVE

## 2022-02-14 LAB — POC COVID19 BINAXNOW: SARS Coronavirus 2 Ag: NEGATIVE

## 2022-02-14 MED ORDER — TRIAMCINOLONE ACETONIDE 40 MG/ML IJ SUSP
60.0000 mg | Freq: Once | INTRAMUSCULAR | Status: AC
  Administered 2022-02-14: 60 mg via INTRAMUSCULAR

## 2022-02-14 MED ORDER — AMOXICILLIN-POT CLAVULANATE 875-125 MG PO TABS: 1.0000 | ORAL_TABLET | Freq: Two times a day (BID) | ORAL | 0 refills | Status: DC

## 2022-02-14 MED ORDER — PROMETHAZINE-DM 6.25-15 MG/5ML PO SYRP: 5.0000 mL | ORAL_SOLUTION | Freq: Four times a day (QID) | ORAL | 0 refills | Status: DC | PRN

## 2022-02-14 NOTE — Assessment & Plan Note (Signed)
COVID and FLU negative Take antibiotics as prescribed Take Promethazine-DM up to 4 times daily for cough/sinus congestion Drink plenty of fluids Follow-up as needed

## 2022-02-14 NOTE — Assessment & Plan Note (Signed)
Take antibiotics as prescribed Take Promethazine-DM up to 4 times daily for cough/sinus congestion Drink plenty of fluids Follow-up as needed  

## 2022-02-14 NOTE — Progress Notes (Signed)
Acute Office Visit  Subjective:    Patient ID: Anna Greene, female    DOB: July 01, 1955, 67 y.o.   MRN: [id number]   Chief Complaints: cough, congestion, post nasal drip and sinus pressure  History of Present ilIness: Patient is in today for URI symptoms UPPER RESPIRATORY TRACT INFECTION  Worst symptom: Fever: no Cough: yes Shortness of breath: no Wheezing: yes Chest pain: no Chest tightness: no Chest congestion: yes Nasal congestion: yes Runny nose: yes Post nasal drip: yes Sneezing: yes Sore throat: yes Swollen glands: no Sinus pressure: yes Headache: yes Face pain: no Toothache: no Ear pain: no  Ear pressure: no  Eyes red/itching:yes Eye drainage/crusting: no  Vomiting: yes Rash: no Fatigue: yes Sick contacts: no Strep contacts: no  Context: worse Recurrent sinusitis: no Relief with OTC cold/cough medications: no  Treatments attempted: mucinex    Past Medical History:  Diagnosis Date   Benign fasciculation-cramp syndrome    Depression    Diabetes mellitus without complication (HCC)    GERD (gastroesophageal reflux disease)    Hyperlipidemia    Hypertension    Nephrolithiasis    Osteoarthritis    Bilateral knees, degenerative disc disease.  Patient has had epidural steroid injections x2   Plantar fasciitis     Past Surgical History:  Procedure Laterality Date   BREAST REDUCTION SURGERY Bilateral [place] ARTHROSCOPY W/ MENISCAL REPAIR Left 1995   Per patient muscle was repaired   KNEE ARTHROSCOPY W/ MENISCAL REPAIR Right 2003   knee surgery Left 10/2020   meniscal repair via arthroscopy   PANNICULECTOMY     REDUCTION MAMMAPLASTY  1996   REPLACEMENT TOTAL KNEE Right 2019   TONSILLECTOMY  1960    Family History  Problem Relation Age of Onset   Stroke Mother    Heart disease Mother    Cancer Father    Diabetes Sister    Anxiety disorder Sister    Depression Sister    Diabetes Sister     Diabetes Sister    Arthritis Sister    Stroke Brother    Breast cancer Neg Hx     Social History   Socioeconomic History   Marital status: Married    Spouse name: Ronnie   Number of children: 2   Years of education: Not on file   Highest education level: Not on file  Occupational History   Occupation: Teacher, music   Occupation: Retired Corporate treasurer  Tobacco Use   Smoking status: Never   Smokeless tobacco: Never  Scientific laboratory technician Use: Never used  Substance and Sexual Activity   Alcohol use: Never   Drug use: Never   Sexual activity: Yes    Partners: Male  Other Topics Concern   Not on file  Social History Narrative   Patient is married.  Has 2 adult children that live in [place] (where she is from)   Social Determinants of Health   Financial Resource Strain: Medium Risk (12/05/2021)   Overall Financial Resource Strain (CARDIA)    Difficulty of Paying Living Expenses: Somewhat hard  Food Insecurity: No Food Insecurity (11/12/2021)   Hunger Vital Sign    Worried About Running Out of Food in the Last Year: Never true    Ran Out of Food in the Last Year: Never true  Transportation Needs: No Transportation Needs (12/05/2021)   PRAPARE - Hydrologist (Medical): No  Lack of Transportation (Non-Medical): No  Physical Activity: Insufficiently Active (05/24/2021)   Exercise Vital Sign    Days of Exercise per Week: 3 days    Minutes of Exercise per Session: 40 min  Stress: No Stress Concern Present (05/24/2021)   [place]    Feeling of Stress : Only a little  Social Connections: Moderately Isolated (11/12/2021)   Social Connection and Isolation Panel [NHANES]    Frequency of Communication with Friends and Family: More than three times a week    Frequency of Social Gatherings with Friends and Family: Twice a week    Attends Religious Services: Never    Museum/gallery conservator or Organizations: No    Attends Archivist Meetings: Never    Marital Status: Married  Human resources officer Violence: Not At Risk (11/12/2021)   Humiliation, Afraid, Rape, and Kick questionnaire    Fear of Current or Ex-Partner: No    Emotionally Abused: No    Physically Abused: No    Sexually Abused: No    Outpatient Medications Prior to Visit  Medication Sig Dispense Refill   atenolol-chlorthalidone (TENORETIC) 100-25 MG tablet Take 1 tablet by mouth daily. 90 tablet 1   celecoxib (CELEBREX) 200 MG capsule Take 1 capsule (200 mg total) by mouth 2 (two) times daily. 180 capsule 1   cetirizine (ZYRTEC) 10 MG tablet Take 1 tablet (10 mg total) by mouth daily. 90 tablet 3   clonazePAM (KLONOPIN) 1 MG tablet TAKE ONE TABLET BY MOUTH AT bedtime AS NEEDED FOR ANXIETY 30 tablet 2   Insulin Pen Needle 32G X 6 MM MISC 1 each by Does not apply route daily. Use with victoza samples 30 each 2   liraglutide (VICTOZA) 18 MG/3ML SOPN Inject 1.8 mg into the skin daily. PAP Approved 2023     losartan (COZAAR) 50 MG tablet Take 1 tablet (50 mg total) by mouth daily. 90 tablet 1   omega-3 acid ethyl esters (LOVAZA) 1 g capsule Take 2 capsules (2 g total) by mouth 2 (two) times daily. 180 capsule 0   omeprazole (PRILOSEC) 20 MG capsule TAKE ONE CAPSULE BY MOUTH EVERY MORNING 90 capsule 1   potassium chloride (KLOR-CON) 10 MEQ tablet TAKE ONE TABLET BY MOUTH EVERY MORNING 90 tablet 1   rosuvastatin (CRESTOR) 5 MG tablet Take 1 tablet (5 mg total) by mouth daily. 90 tablet 1   tiZANidine (ZANAFLEX) 4 MG tablet Take 1 tablet (4 mg total) by mouth every 8 (eight) hours as needed for muscle spasms. 270 tablet 1   No facility-administered medications prior to visit.    Allergies  Allergen Reactions   Metformin And Related     Diarrhea   Ramipril Other (See Comments)    Cough   Tuberculin, Ppd Other (See Comments)    False positive    Review of Systems  Constitutional:  Negative for  chills, fatigue and fever.  HENT:  Positive for congestion. Negative for ear pain and sore throat.   Respiratory:  Positive for cough. Negative for shortness of breath.   Cardiovascular:  Negative for chest pain and palpitations.  Gastrointestinal:  Negative for abdominal pain, constipation, diarrhea, nausea and vomiting.  Endocrine: Negative for polydipsia, polyphagia and polyuria.  Genitourinary:  Negative for difficulty urinating and dysuria.  Musculoskeletal:  Negative for arthralgias, back pain and myalgias.  Skin:  Negative for rash.  Neurological:  Negative for headaches.  Psychiatric/Behavioral:  Negative for dysphoric mood.  The patient is not nervous/anxious.    See pertinent positives and negatives per HPI.     Objective:    Physical Exam Vitals reviewed.  Constitutional:      Appearance: Normal appearance.  HENT:     Nose:     Right Turbinates: Enlarged and swollen.     Left Turbinates: Enlarged and swollen.     Right Sinus: Maxillary sinus tenderness present.     Left Sinus: Maxillary sinus tenderness present. No frontal sinus tenderness.  Neck:     Vascular: No carotid bruit.  Cardiovascular:     Rate and Rhythm: Normal rate and regular rhythm.     Heart sounds: Normal heart sounds.  Pulmonary:     Effort: Pulmonary effort is normal.     Breath sounds: Wheezing present.  Abdominal:     General: Bowel sounds are normal.     Palpations: Abdomen is soft.     Tenderness: There is no abdominal tenderness.  Neurological:     Mental Status: She is alert and oriented to person, place, and time.  Psychiatric:        Mood and Affect: Mood normal.        Behavior: Behavior normal.     BP 132/70   Pulse 82   Temp 98.4 F (36.9 C)   Resp 14   Ht 5\' 6"  (1.676 m)   Wt 209 lb (94.8 kg)   LMP  (LMP Unknown)   SpO2 95%   BMI 33.73 kg/m  Wt Readings from Last 3 Encounters:  02/14/22 209 lb (94.8 kg)  02/04/22 211 lb (95.7 kg)  10/01/21 209 lb (94.8 kg)     Health Maintenance Due  Topic Date Due   Hepatitis C Screening  Never done   OPHTHALMOLOGY EXAM  07/24/2021    There are no preventive care reminders to display for this patient.   Lab Results  Component Value Date   TSH 0.948 01/28/2022   Lab Results  Component Value Date   WBC 7.0 01/28/2022   HGB 12.9 01/28/2022   HCT 39.5 01/28/2022   MCV 87 01/28/2022   PLT 297 01/28/2022   Lab Results  Component Value Date   NA 138 01/28/2022   K 4.4 01/28/2022   CO2 26 01/28/2022   GLUCOSE 101 (H) 01/28/2022   BUN 21 01/28/2022   CREATININE 0.99 01/28/2022   BILITOT 0.3 01/28/2022   ALKPHOS 93 01/28/2022   AST 21 01/28/2022   ALT 19 01/28/2022   PROT 6.6 01/28/2022   ALBUMIN 4.2 01/28/2022   CALCIUM 9.7 01/28/2022   EGFR 62 01/28/2022   Lab Results  Component Value Date   CHOL 170 01/28/2022   Lab Results  Component Value Date   HDL 53 01/28/2022   Lab Results  Component Value Date   LDLCALC 84 01/28/2022   Lab Results  Component Value Date   TRIG 198 (H) 01/28/2022   Lab Results  Component Value Date   CHOLHDL 3.2 01/28/2022   Lab Results  Component Value Date   HGBA1C 6.1 (H) 01/28/2022       Assessment & Plan:   Acute non-recurrent maxillary sinusitis Assessment & Plan: Take antibiotics as prescribed Take Promethazine-DM up to 4 times daily for cough/sinus congestion Drink plenty of fluids Follow-up as needed   Orders: -     Amoxicillin-Pot Clavulanate; Take 1 tablet by mouth 2 (two) times daily.  Dispense: 20 tablet; Refill: 0 -     Promethazine-DM; Take 5  mLs by mouth 4 (four) times daily as needed.  Dispense: 118 mL; Refill: 0 -     Triamcinolone Acetonide  Acute cough Assessment & Plan: COVID and FLU negative Take antibiotics as prescribed Take Promethazine-DM up to 4 times daily for cough/sinus congestion Drink plenty of fluids Follow-up as needed   Orders: -     POC COVID-19 BinaxNow -     POCT Influenza A/B -      Promethazine-DM; Take 5 mLs by mouth 4 (four) times daily as needed.  Dispense: 118 mL; Refill: 0 -     Triamcinolone Acetonide    Follow-up: Return if symptoms worsen or fail to improve. I, Neil Crouch have reviewed all documentation for this visit. The documentation on 02/14/22   for the exam, diagnosis, procedures, and orders are all accurate and complete.     An After Visit Summary was printed and given to the patient.  Neil Crouch, DNP, [place] [phone]

## 2022-02-14 NOTE — Patient Instructions (Signed)
Take antibiotics as prescribed Take Promethazine-DM up to 4 times daily for cough/sinus congestion Drink plenty of fluids Follow-up as needed   Sinus Infection, Adult A sinus infection, also called sinusitis, is inflammation of your sinuses. Sinuses are hollow spaces in the bones around your face. Your sinuses are located: Around your eyes. In the middle of your forehead. Behind your nose. In your cheekbones. Mucus normally drains out of your sinuses. When your nasal tissues become inflamed or swollen, mucus can become trapped or blocked. This allows bacteria, viruses, and fungi to grow, which leads to infection. Most infections of the sinuses are caused by a virus. A sinus infection can develop quickly. It can last for up to 4 weeks (acute) or for more than 12 weeks (chronic). A sinus infection often develops after a cold. What are the causes? This condition is caused by anything that creates swelling in the sinuses or stops mucus from draining. This includes: Allergies. Asthma. Infection from bacteria or viruses. Deformities or blockages in your nose or sinuses. Abnormal growths in the nose (nasal polyps). Pollutants, such as chemicals or irritants in the air. Infection from fungi. This is rare. What increases the risk? You are more likely to develop this condition if you: Have a weak body defense system (immune system). Do a lot of swimming or diving. Overuse nasal sprays. Smoke. What are the signs or symptoms? The main symptoms of this condition are pain and a feeling of pressure around the affected sinuses. Other symptoms include: Stuffy nose or congestion that makes it difficult to breathe through your nose. Thick yellow or greenish drainage from your nose. Tenderness, swelling, and warmth over the affected sinuses. A cough that may get worse at night. Decreased sense of smell and taste. Extra mucus that collects in the throat or the back of the nose (postnasal drip) causing  a sore throat or bad breath. Tiredness (fatigue). Fever. How is this diagnosed? This condition is diagnosed based on: Your symptoms. Your medical history. A physical exam. Tests to find out if your condition is acute or chronic. This may include: Checking your nose for nasal polyps. Viewing your sinuses using a device that has a light (endoscope). Testing for allergies or bacteria. Imaging tests, such as an MRI or CT scan. In rare cases, a bone biopsy may be done to rule out more serious types of fungal sinus disease. How is this treated? Treatment for a sinus infection depends on the cause and whether your condition is chronic or acute. If caused by a virus, your symptoms should go away on their own within 10 days. You may be given medicines to relieve symptoms. They include: Medicines that shrink swollen nasal passages (decongestants). A spray that eases inflammation of the nostrils (topical intranasal corticosteroids). Rinses that help get rid of thick mucus in your nose (nasal saline washes). Medicines that treat allergies (antihistamines). Over-the-counter pain relievers. If caused by bacteria, your health care provider may recommend waiting to see if your symptoms improve. Most bacterial infections will get better without antibiotic medicine. You may be given antibiotics if you have: A severe infection. A weak immune system. If caused by narrow nasal passages or nasal polyps, surgery may be needed. Follow these instructions at home: Medicines Take, use, or apply over-the-counter and prescription medicines only as told by your health care provider. These may include nasal sprays. If you were prescribed an antibiotic medicine, take it as told by your health care provider. Do not stop taking the antibiotic even  if you start to feel better. Hydrate and humidify  Drink enough fluid to keep your urine pale yellow. Staying hydrated will help to thin your mucus. Use a cool mist  humidifier to keep the humidity level in your home above 50%. Inhale steam for 10-15 minutes, 3-4 times a day, or as told by your health care provider. You can do this in the bathroom while a hot shower is running. Limit your exposure to cool or dry air. Rest Rest as much as possible. Sleep with your head raised (elevated). Make sure you get enough sleep each night. General instructions  Apply a warm, moist washcloth to your face 3-4 times a day or as told by your health care provider. This will help with discomfort. Use nasal saline washes as often as told by your health care provider. Wash your hands often with soap and water to reduce your exposure to germs. If soap and water are not available, use hand sanitizer. Do not smoke. Avoid being around people who are smoking (secondhand smoke). Keep all follow-up visits. This is important. Contact a health care provider if: You have a fever. Your symptoms get worse. Your symptoms do not improve within 10 days. Get help right away if: You have a severe headache. You have persistent vomiting. You have severe pain or swelling around your face or eyes. You have vision problems. You develop confusion. Your neck is stiff. You have trouble breathing. These symptoms may be an emergency. Get help right away. Call 911. Do not wait to see if the symptoms will go away. Do not drive yourself to the hospital. Summary A sinus infection is soreness and inflammation of your sinuses. Sinuses are hollow spaces in the bones around your face. This condition is caused by nasal tissues that become inflamed or swollen. The swelling traps or blocks the flow of mucus. This allows bacteria, viruses, and fungi to grow, which leads to infection. If you were prescribed an antibiotic medicine, take it as told by your health care provider. Do not stop taking the antibiotic even if you start to feel better. Keep all follow-up visits. This is important. This  information is not intended to replace advice given to you by your health care provider. Make sure you discuss any questions you have with your health care provider. Document Revised: 12/05/2020 Document Reviewed: 12/05/2020 Elsevier Patient Education  [place].

## 2022-02-18 ENCOUNTER — Encounter: Payer: Self-pay | Admitting: Family Medicine

## 2022-02-21 ENCOUNTER — Ambulatory Visit (INDEPENDENT_AMBULATORY_CARE_PROVIDER_SITE_OTHER): Payer: Medicare HMO

## 2022-02-21 ENCOUNTER — Telehealth: Payer: Self-pay

## 2022-02-21 ENCOUNTER — Telehealth: Payer: Medicare HMO

## 2022-02-21 DIAGNOSIS — I152 Hypertension secondary to endocrine disorders: Secondary | ICD-10-CM

## 2022-02-21 DIAGNOSIS — E1165 Type 2 diabetes mellitus with hyperglycemia: Secondary | ICD-10-CM

## 2022-02-21 NOTE — Patient Instructions (Signed)
Please call the care guide team at [phone] if you need to cancel or reschedule your appointment.   If you are experiencing a Mental Health or [place] or need someone to talk to, please call the Suicide and Crisis Lifeline: 988 call the [place] National Suicide Prevention Lifeline: 972-017-0540 or TTY: 830-618-5422 TTY (509) 082-5678) to talk to a trained counselor call 1-800-273-TALK (toll free, 24 hour hotline) go to [facility] [place] [phone])   Following is a copy of the CCM Program Consent:  CCM service includes personalized support from designated clinical staff supervised by the physician, including individualized plan of care and coordination with other care providers 24/7 contact phone numbers for assistance for urgent and routine care needs. Service will only be billed when office clinical staff spend 20 minutes or more in a month to coordinate care. Only one practitioner may furnish and bill the service in a calendar month. The patient may stop CCM services at amy time (effective at the end of the month) by phone call to the office staff. The patient will be responsible for cost sharing (co-pay) or up to 20% of the service fee (after annual deductible is met)  Following is a copy of your full provider care plan:   Goals Addressed             This Visit's Progress    CCM Expected Outcome:  Monitor, Self-Manage and Reduce Symptoms of Diabetes       Current Barriers:  Care Coordination needs related to cost of Victoza and current pap in a patient with DM Chronic Disease Management support and education needs related to effective management of DM Financial Constraints.  Lab Results  Component Value Date   HGBA1C 6.1 (H) 01/28/2022     Planned Interventions: Provided education to patient about basic DM disease process. The patient has good control of her DM. Her A1C is slightly elevated from 5.9%  to 6.1% on 01-28-2022. ; Reviewed medications with patient and discussed importance of medication adherence. The patient continues to work with pharm D for Victoza PAP assistance. The patient states she was told on 02-04-2022 that she had been approved for Victoza for 2024 and they told her it would be mailed to the office in 7 to 14 business days from that date. She has also received a letter stating of the the approval. She has called the office today and there is not a delivery of her Victoza. Will collaborate with the pharm D for assistance with medication needs. Secure chat and will also send the chart for pharm D support ;        Reviewed prescribed diet with patient heart healthy/ADA diet. Review and education given ; Counseled on importance of regular laboratory monitoring as prescribed. Had labs done in January. A1C up slightly. Has regular lab work;        Discussed plans with patient for ongoing care management follow up and provided patient with direct contact information for care management team;      Provided patient with written educational materials related to hypo and hyperglycemia and importance of correct treatment;       Reviewed scheduled/upcoming provider appointments including: 06-24-2022;         Advised patient, providing education and rationale, to check cbg when you have symptoms of low or high blood sugar and record        call provider for findings outside established parameters;  Referral made to pharmacy team for assistance with medication questions and assistance with PAP for Victoza. Is on pill packaging system. Has been approved for 2024 and received her letter. The patient has not received the medications yet. Message sent to the pharm D.    Review of patient status, including review of consultants reports, relevant laboratory and other test results, and medications completed;       Advised patient to discuss changes in DM or chronic conditions with provider;       Screening for signs and symptoms of depression related to chronic disease state;        Assessed social determinant of health barriers;         Symptom Management: Take medications as prescribed   Attend all scheduled provider appointments Call pharmacy for medication refills 3-7 days in advance of running out of medications Call provider office for new concerns or questions  call the Suicide and Crisis Lifeline: 988 call the [place] National Suicide Prevention Lifeline: (712) 122-5695 or TTY: (602)203-2000 TTY 386-738-0238) to talk to a trained counselor call 1-800-273-TALK (toll free, 24 hour hotline) if experiencing a Mental Health or [place]  check blood sugar at prescribed times: when you have symptoms of low or high blood sugar check feet daily for cuts, sores or redness trim toenails straight across wash and dry feet carefully every day wear comfortable, cotton socks wear comfortable, well-fitting shoes  Follow Up Plan: Telephone follow up appointment with care management team member scheduled for: 05-02-2022 at 230  pm       CCM Expected Outcome:  Monitor, Self-Manage, and Reduce Symptoms of Hypertension       Current Barriers:  Chronic Disease Management support and education needs related to effective management of HTN Financial Constraints.  BP Readings from Last 3 Encounters:  02/14/22 132/70  02/04/22 128/78  10/01/21 122/82     Planned Interventions: Evaluation of current treatment plan related to hypertension self management and patient's adherence to plan as established by provider. The patient states her blood pressures have been stable. The patient saw the pcp in January and February and blood pressures was stable. Denies any acute changes at this time. Provided education to patient re: stroke prevention, s/s of heart attack and stroke; Reviewed prescribed diet heart healthy/ADA diet. Review and education. The patient is compliant with dietary  restrictions Reviewed medications with patient and discussed importance of compliance. Patient is compliant with medications. Denies any medication needs related to her HTN and heart health;  Discussed plans with patient for ongoing care management follow up and provided patient with direct contact information for care management team; Reviewed scheduled/upcoming provider appointments including: 06-24-2022 at 1040 am. 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   Symptom Management: Take medications as prescribed   Attend all scheduled provider appointments Call pharmacy for medication refills 3-7 days in advance of running out of medications Call provider office for new concerns or questions  call the Suicide and Crisis Lifeline: 988 call the [place] National Suicide Prevention Lifeline: 847 846 0209 or TTY: 301-490-2195 TTY (575)445-9458) to  talk to a trained counselor call 1-800-273-TALK (toll free, 24 hour hotline) if experiencing a Mental Health or [place]  learn about high blood pressure call doctor for signs and symptoms of high blood pressure develop an action plan for high blood pressure keep all doctor appointments take medications for blood pressure exactly as prescribed report new symptoms to your doctor  Follow Up Plan: Telephone follow up appointment with care management team member scheduled for: 05-02-2022 at 230 pm          Patient verbalizes understanding  of instructions and care plan provided today and agrees to view in [place]. Active MyChart status and patient understanding of how to access instructions and care plan via MyChart confirmed with patient.     Telephone follow up appointment with care management team member scheduled for: 05-02-2022 at 230 pm

## 2022-02-21 NOTE — Telephone Encounter (Signed)
Coordinated with patient and told her to call company to make sure med is delivered. This is after getting msg from [name].

## 2022-02-21 NOTE — Chronic Care Management (AMB) (Signed)
Chronic Care Management   CCM RN Visit Note  02/21/2022 Name: Anna Greene MRN: [id number] DOB: February 03, 1955  Subjective: Anna Greene is a 67 y.o. year old female who is a primary care patient of Cox, Kirsten, MD. The patient was referred to the Chronic Care Management team for assistance with care management needs subsequent to provider initiation of CCM services and plan of care.    Today's Visit:  Engaged with patient by telephone for follow up visit.        Goals Addressed             This Visit's Progress    CCM Expected Outcome:  Monitor, Self-Manage and Reduce Symptoms of Diabetes       Current Barriers:  Care Coordination needs related to cost of Victoza and current pap in a patient with DM Chronic Disease Management support and education needs related to effective management of DM Financial Constraints.  Lab Results  Component Value Date   HGBA1C 6.1 (H) 01/28/2022     Planned Interventions: Provided education to patient about basic DM disease process. The patient has good control of her DM. Her A1C is slightly elevated from 5.9% to 6.1% on 01-28-2022. ; Reviewed medications with patient and discussed importance of medication adherence. The patient continues to work with pharm D for Victoza PAP assistance. The patient states she was told on 02-04-2022 that she had been approved for Victoza for 2024 and they told her it would be mailed to the office in 7 to 14 business days from that date. She has also received a letter stating of the the approval. She has called the office today and there is not a delivery of her Victoza. Will collaborate with the pharm D for assistance with medication needs. Secure chat and will also send the chart for pharm D support ;        Reviewed prescribed diet with patient heart healthy/ADA diet. Review and education given ; Counseled on importance of regular laboratory monitoring as prescribed. Had labs done in January. A1C up slightly. Has regular lab  work;        Discussed plans with patient for ongoing care management follow up and provided patient with direct contact information for care management team;      Provided patient with written educational materials related to hypo and hyperglycemia and importance of correct treatment;       Reviewed scheduled/upcoming provider appointments including: 06-24-2022;         Advised patient, providing education and rationale, to check cbg when you have symptoms of low or high blood sugar and record        call provider for findings outside established parameters;       Referral made to pharmacy team for assistance with medication questions and assistance with PAP for Victoza. Is on pill packaging system. Has been approved for 2024 and received her letter. The patient has not received the medications yet. Message sent to the pharm D.    Review of patient status, including review of consultants reports, relevant laboratory and other test results, and medications completed;       Advised patient to discuss changes in DM or chronic conditions with provider;      Screening for signs and symptoms of depression related to chronic disease state;        Assessed social determinant of health barriers;         Symptom Management: Take medications as prescribed   Attend all scheduled  provider appointments Call pharmacy for medication refills 3-7 days in advance of running out of medications Call provider office for new concerns or questions  call the Suicide and Crisis Lifeline: 988 call the [place] National Suicide Prevention Lifeline: 9867142761 or TTY: 2285280620 TTY 415-090-4586) to talk to a trained counselor call 1-800-273-TALK (toll free, 24 hour hotline) if experiencing a Mental Health or [place]  check blood sugar at prescribed times: when you have symptoms of low or high blood sugar check feet daily for cuts, sores or redness trim toenails straight across wash and dry feet  carefully every day wear comfortable, cotton socks wear comfortable, well-fitting shoes  Follow Up Plan: Telephone follow up appointment with care management team member scheduled for: 05-02-2022 at 230  pm       CCM Expected Outcome:  Monitor, Self-Manage, and Reduce Symptoms of Hypertension       Current Barriers:  Chronic Disease Management support and education needs related to effective management of HTN Financial Constraints.  BP Readings from Last 3 Encounters:  02/14/22 132/70  02/04/22 128/78  10/01/21 122/82     Planned Interventions: Evaluation of current treatment plan related to hypertension self management and patient's adherence to plan as established by provider. The patient states her blood pressures have been stable. The patient saw the pcp in January and February and blood pressures was stable. Denies any acute changes at this time. Provided education to patient re: stroke prevention, s/s of heart attack and stroke; Reviewed prescribed diet heart healthy/ADA diet. Review and education. The patient is compliant with dietary restrictions Reviewed medications with patient and discussed importance of compliance. Patient is compliant with medications. Denies any medication needs related to her HTN and heart health;  Discussed plans with patient for ongoing care management follow up and provided patient with direct contact information for care management team; Reviewed scheduled/upcoming provider appointments including: 06-24-2022 at 1040 am. 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   Symptom Management: Take medications as prescribed   Attend all scheduled provider appointments Call pharmacy for medication refills 3-7 days in advance of running out of medications Call provider office for new concerns or questions  call the Suicide and Crisis Lifeline: 988 call the [place] National Suicide Prevention Lifeline: (873) 636-5321 or TTY: 770 877 0078 TTY (715)472-7569) to talk to a trained counselor call 1-800-273-TALK (toll free, 24 hour hotline) if experiencing a Mental Health or [place] about high blood pressure call doctor for signs and symptoms of high blood pressure develop an action plan for high blood pressure keep all doctor appointments take medications for blood pressure exactly as prescribed report new symptoms to your doctor  Follow Up Plan: Telephone follow up appointment with care management team member scheduled for: 05-02-2022 at 230 pm          Plan:Telephone follow up appointment with care management team member scheduled for:  05-02-2022 at 46 pm  [place], MSN, CCM RN Care Manager  Chronic Care Management Direct Number: [phone]

## 2022-02-26 ENCOUNTER — Other Ambulatory Visit: Payer: Self-pay | Admitting: Family Medicine

## 2022-02-26 DIAGNOSIS — Z1231 Encounter for screening mammogram for malignant neoplasm of breast: Secondary | ICD-10-CM

## 2022-02-28 ENCOUNTER — Telehealth: Payer: Self-pay

## 2022-02-28 NOTE — Progress Notes (Cosign Needed)
Patient approved to receive Victoza through [employer] patient assistance program, enrollment ends 01/14/2023.    Pattricia Boss, [place] Pharmacist Assistant [phone]

## 2022-02-28 NOTE — Telephone Encounter (Signed)
I left detailed message on voicemail to call us back or come by to the office to pick up patient assistance (Victoza).

## 2022-03-05 DIAGNOSIS — M5416 Radiculopathy, lumbar region: Secondary | ICD-10-CM | POA: Diagnosis not present

## 2022-03-07 ENCOUNTER — Other Ambulatory Visit: Payer: Self-pay

## 2022-03-07 ENCOUNTER — Telehealth: Payer: Self-pay

## 2022-03-07 DIAGNOSIS — K219 Gastro-esophageal reflux disease without esophagitis: Secondary | ICD-10-CM

## 2022-03-07 DIAGNOSIS — E1159 Type 2 diabetes mellitus with other circulatory complications: Secondary | ICD-10-CM

## 2022-03-07 MED ORDER — POTASSIUM CHLORIDE ER 10 MEQ PO TBCR: 10.0000 meq | EXTENDED_RELEASE_TABLET | Freq: Every morning | ORAL | 1 refills | Status: DC

## 2022-03-07 MED ORDER — CETIRIZINE HCL 10 MG PO TABS: 10.0000 mg | ORAL_TABLET | Freq: Every day | ORAL | 3 refills | Status: AC

## 2022-03-07 MED ORDER — OMEPRAZOLE 20 MG PO CPDR: 20.0000 mg | DELAYED_RELEASE_CAPSULE | Freq: Every morning | ORAL | 1 refills | Status: DC

## 2022-03-07 MED ORDER — OMEGA-3-ACID ETHYL ESTERS 1 G PO CAPS: 2.0000 g | ORAL_CAPSULE | Freq: Two times a day (BID) | ORAL | 0 refills | Status: DC

## 2022-03-07 NOTE — Telephone Encounter (Signed)
Error

## 2022-03-07 NOTE — Progress Notes (Signed)
Care Management & Coordination Services Pharmacy Team  Reason for Encounter: Medication coordination and delivery  Contacted patient to discuss medications and coordinate delivery from [facility]. Spoke with patient on 03/08/2022  Cycle dispensing form sent to [place] [facility] for review.   Last adherence delivery date: 02-18-2022  Patient is due for next adherence delivery on: 03-19-2022  This delivery to include: Adherence Packaging  30 Days  Clonazepam 1 mg at bedtime PRN- Vials Calcium/Vitamin D3 600-800 1 at breakfast and 1 at evening meal Cetirizine '10mg'$  1 at evening meal Potassium Chloride Er 33mq 1 at Breakfast Omeprazole '20mg'$  1 at breakfast Losartan '50mg'$  1 at breakfast Atenolol-Chlorthalidone 100-'25mg'$  1 at breakfast Celecoxib '200mg'$  1 at breakfast and 1 at evening meal Rosuvastatin '5mg'$  1 at evening meal Tizanidine '4mg'$ -1 tab every 8 hours as needed- Vials Omega 3 1 gm 2 at B 2 with EM    Patient declined the following medications this month: Victoza- PAP  Refills requested from providers include: Omega 3  Cetirizine Potassium Chloride Omeprazole   Confirmed delivery date of 03-19-2022, advised patient that pharmacy will contact them the morning of delivery.   Any concerns about your medications? No  How often do you forget or accidentally miss a dose? Never  Do you use a pillbox? No  Is patient in packaging Yes  If yes  What is the date on your next pill pack? 03-09-2022  Any concerns or issues with your packaging? No   Recent blood pressure readings are as follows: 140/90, 138/90   Chart review: Recent office visits:  02-21-2022 TVanita Ingles RN (CCM)  02-14-2022 ANeil Crouch FNP. Negative covid and flu test. START Augmentin 875-125 mg twice daily and promethazine DM 5 mLs 4 times daily PRN.  Recent consult visits:  None  Hospital visits:  None in previous 6 months  Medications: Outpatient Encounter Medications as of 03/07/2022   Medication Sig   amoxicillin-clavulanate (AUGMENTIN) 875-125 MG tablet Take 1 tablet by mouth 2 (two) times daily.   atenolol-chlorthalidone (TENORETIC) 100-25 MG tablet Take 1 tablet by mouth daily.   celecoxib (CELEBREX) 200 MG capsule Take 1 capsule (200 mg total) by mouth 2 (two) times daily.   cetirizine (ZYRTEC) 10 MG tablet Take 1 tablet (10 mg total) by mouth daily.   clonazePAM (KLONOPIN) 1 MG tablet TAKE ONE TABLET BY MOUTH AT bedtime AS NEEDED FOR ANXIETY   Insulin Pen Needle 32G X 6 MM MISC 1 each by Does not apply route daily. Use with victoza samples   liraglutide (VICTOZA) 18 MG/3ML SOPN Inject 1.8 mg into the skin daily. PAP Approved 2023   losartan (COZAAR) 50 MG tablet Take 1 tablet (50 mg total) by mouth daily.   omega-3 acid ethyl esters (LOVAZA) 1 g capsule Take 2 capsules (2 g total) by mouth 2 (two) times daily.   omeprazole (PRILOSEC) 20 MG capsule TAKE ONE CAPSULE BY MOUTH EVERY MORNING   potassium chloride (KLOR-CON) 10 MEQ tablet TAKE ONE TABLET BY MOUTH EVERY MORNING   promethazine-dextromethorphan (PROMETHAZINE-DM) 6.25-15 MG/5ML syrup Take 5 mLs by mouth 4 (four) times daily as needed.   rosuvastatin (CRESTOR) 5 MG tablet Take 1 tablet (5 mg total) by mouth daily.   tiZANidine (ZANAFLEX) 4 MG tablet Take 1 tablet (4 mg total) by mouth every 8 (eight) hours as needed for muscle spasms.   No facility-administered encounter medications on file as of 03/07/2022.   BP Readings from Last 3 Encounters:  02/14/22 132/70  02/04/22 128/78  10/01/21  122/82    Pulse Readings from Last 3 Encounters:  02/14/22 82  02/04/22 78  10/01/21 80    Lab Results  Component Value Date/Time   HGBA1C 6.1 (H) 01/28/2022 09:24 AM   HGBA1C 5.9 (H) 09/24/2021 08:50 AM   Lab Results  Component Value Date   CREATININE 0.99 01/28/2022   BUN 21 01/28/2022   GFRNONAA 74 02/21/2020   GFRAA 85 02/21/2020   NA 138 01/28/2022   K 4.4 01/28/2022   CALCIUM 9.7 01/28/2022   CO2 26  01/28/2022     [place] Pharmacist Assistant [phone]

## 2022-03-14 DIAGNOSIS — I10 Essential (primary) hypertension: Secondary | ICD-10-CM

## 2022-03-14 DIAGNOSIS — E1159 Type 2 diabetes mellitus with other circulatory complications: Secondary | ICD-10-CM | POA: Diagnosis not present

## 2022-03-27 ENCOUNTER — Telehealth: Payer: Self-pay

## 2022-03-27 NOTE — Progress Notes (Cosign Needed)
Faxed Tier Exception forms to [facility] for Omega 3 and Terbinafine.   Pattricia Boss, [place] Pharmacist Assistant [phone]

## 2022-03-28 ENCOUNTER — Telehealth: Payer: Self-pay

## 2022-03-28 NOTE — Telephone Encounter (Signed)
Patient has been approved for Omega-3-Acid-Ethyl medication tier change for 01/14/2022 till 01/14/2023.

## 2022-04-04 ENCOUNTER — Other Ambulatory Visit: Payer: Self-pay | Admitting: Family Medicine

## 2022-04-04 DIAGNOSIS — E782 Mixed hyperlipidemia: Secondary | ICD-10-CM

## 2022-04-08 ENCOUNTER — Telehealth: Payer: Self-pay

## 2022-04-08 ENCOUNTER — Other Ambulatory Visit: Payer: Self-pay | Admitting: Family Medicine

## 2022-04-08 DIAGNOSIS — F411 Generalized anxiety disorder: Secondary | ICD-10-CM

## 2022-04-08 NOTE — Progress Notes (Signed)
Care Management & Coordination Services Pharmacy Team  Reason for Encounter: Medication coordination and delivery  Contacted patient to discuss medications and coordinate delivery from [facility]. Spoke with patient on 04/08/2022  Cycle dispensing form sent to [place] [facility] for review.   Last adherence delivery date: 03-19-2022   Patient is due for next adherence delivery on: 04-18-2022  This delivery to include: Adherence Packaging  30 Days  Clonazepam 1 mg at bedtime PRN- Vials Calcium/Vitamin D3 600-800 1 at breakfast and 1 at evening meal Cetirizine 10mg  1 at evening meal Potassium Chloride Er 22meq 1 at Breakfast Omeprazole 20mg  1 at breakfast Losartan 50mg  1 at breakfast Atenolol-Chlorthalidone 100-25mg  1 at breakfast Celecoxib 200mg  1 at breakfast and 1 at evening meal Rosuvastatin 5mg  1 at evening meal Tizanidine 4mg -1 tab every 8 hours as needed- Vials Terbinafine 250 mg 1 at breakfast- add to delivery sent rx request and to add back to med list Omega 3 1 gm 2 at B 2 with EM- add to delivery sent refill request  Patient declined the following medications this month: Victoza 18mg /11ml - Gets PAP    Refills requested from providers include: Clonazepam  Confirmed delivery date of 04-18-2022, advised patient that pharmacy will contact them the morning of delivery.   Any concerns about your medications? No  How often do you forget or accidentally miss a dose? Never  Do you use a pillbox? No  Is patient in packaging Yes  If yes  What is the date on your next pill pack? 04-09-2022  Any concerns or issues with your packaging?  No   Recent blood pressure readings are as follows: 120/70, 124/78  Chart review: Recent office visits:  None  Recent consult visits:  None  Hospital visits:  None in previous 6 months  Medications: Outpatient Encounter Medications as of 04/08/2022  Medication Sig   amoxicillin-clavulanate (AUGMENTIN) 875-125 MG tablet Take 1  tablet by mouth 2 (two) times daily.   atenolol-chlorthalidone (TENORETIC) 100-25 MG tablet Take 1 tablet by mouth daily.   celecoxib (CELEBREX) 200 MG capsule Take 1 capsule (200 mg total) by mouth 2 (two) times daily.   cetirizine (ZYRTEC) 10 MG tablet Take 1 tablet (10 mg total) by mouth daily.   clonazePAM (KLONOPIN) 1 MG tablet TAKE ONE TABLET BY MOUTH AT bedtime AS NEEDED FOR ANXIETY   Insulin Pen Needle 32G X 6 MM MISC 1 each by Does not apply route daily. Use with victoza samples   liraglutide (VICTOZA) 18 MG/3ML SOPN Inject 1.8 mg into the skin daily. PAP Approved 2023   losartan (COZAAR) 50 MG tablet Take 1 tablet (50 mg total) by mouth daily.   omega-3 acid ethyl esters (LOVAZA) 1 g capsule Take 2 capsules (2 g total) by mouth 2 (two) times daily.   omeprazole (PRILOSEC) 20 MG capsule Take 1 capsule (20 mg total) by mouth every morning.   potassium chloride (KLOR-CON) 10 MEQ tablet Take 1 tablet (10 mEq total) by mouth every morning.   promethazine-dextromethorphan (PROMETHAZINE-DM) 6.25-15 MG/5ML syrup Take 5 mLs by mouth 4 (four) times daily as needed.   rosuvastatin (CRESTOR) 5 MG tablet TAKE ONE TABLET BY MOUTH EVERY EVENING   tiZANidine (ZANAFLEX) 4 MG tablet TAKE ONE TABLET BY MOUTH every EIGHT hours AS NEEDED FOR MUSCLE SPASMS   No facility-administered encounter medications on file as of 04/08/2022.   BP Readings from Last 3 Encounters:  02/14/22 132/70  02/04/22 128/78  10/01/21 122/82    Pulse Readings from Last  3 Encounters:  02/14/22 82  02/04/22 78  10/01/21 80    Lab Results  Component Value Date/Time   HGBA1C 6.1 (H) 01/28/2022 09:24 AM   HGBA1C 5.9 (H) 09/24/2021 08:50 AM   Lab Results  Component Value Date   CREATININE 0.99 01/28/2022   BUN 21 01/28/2022   GFRNONAA 74 02/21/2020   GFRAA 85 02/21/2020   NA 138 01/28/2022   K 4.4 01/28/2022   CALCIUM 9.7 01/28/2022   CO2 26 01/28/2022     [place] Clinical Pharmacist  Assistant [phone]

## 2022-04-09 ENCOUNTER — Other Ambulatory Visit: Payer: Self-pay | Admitting: Family Medicine

## 2022-04-09 MED ORDER — OMEGA-3-ACID ETHYL ESTERS 1 G PO CAPS: 2.0000 g | ORAL_CAPSULE | Freq: Two times a day (BID) | ORAL | 0 refills | Status: DC

## 2022-04-09 MED ORDER — TERBINAFINE HCL 250 MG PO TABS: 250.0000 mg | ORAL_TABLET | Freq: Every day | ORAL | 2 refills | Status: DC

## 2022-04-22 DIAGNOSIS — M47816 Spondylosis without myelopathy or radiculopathy, lumbar region: Secondary | ICD-10-CM | POA: Diagnosis not present

## 2022-04-24 ENCOUNTER — Ambulatory Visit
Admission: RE | Admit: 2022-04-24 | Discharge: 2022-04-24 | Disposition: A | Discharge status: Home / Self Care | Payer: Medicare HMO | Source: Ambulatory Visit | Attending: Family Medicine | Admitting: Family Medicine

## 2022-04-24 DIAGNOSIS — Z1231 Encounter for screening mammogram for malignant neoplasm of breast: Secondary | ICD-10-CM | POA: Diagnosis not present

## 2022-04-30 ENCOUNTER — Telehealth: Payer: Self-pay

## 2022-04-30 NOTE — Progress Notes (Cosign Needed)
[employer] patient assistance program notification:  120- day supply of Victoza will be filled on 05/19/2022 and should arrive to the office in 10-14 business days. Patient enrollment will expire on 01/14/2023.   Billee Cashing, CMA Clinical Pharmacist Assistant [phone]

## 2022-05-02 ENCOUNTER — Telehealth: Payer: Medicare HMO

## 2022-05-06 ENCOUNTER — Telehealth: Payer: Medicare HMO

## 2022-05-06 ENCOUNTER — Ambulatory Visit (INDEPENDENT_AMBULATORY_CARE_PROVIDER_SITE_OTHER): Payer: Medicare HMO

## 2022-05-06 DIAGNOSIS — E1165 Type 2 diabetes mellitus with hyperglycemia: Secondary | ICD-10-CM

## 2022-05-06 DIAGNOSIS — E1159 Type 2 diabetes mellitus with other circulatory complications: Secondary | ICD-10-CM

## 2022-05-06 IMAGING — MG MM DIGITAL SCREENING BILAT W/ TOMO AND CAD
6 of 10 series · 6 of 30 positions shown · non-contrast
Comparison: Previous exam(s).

CLINICAL DATA: Screening.

EXAM:
DIGITAL SCREENING BILATERAL MAMMOGRAM WITH TOMOSYNTHESIS AND CAD
TECHNIQUE: Bilateral screening digital craniocaudal and mediolateral oblique
mammograms were obtained. Bilateral screening digital breast
tomosynthesis was performed. The images were evaluated with
computer-aided detection.

[R CC synth-2D]
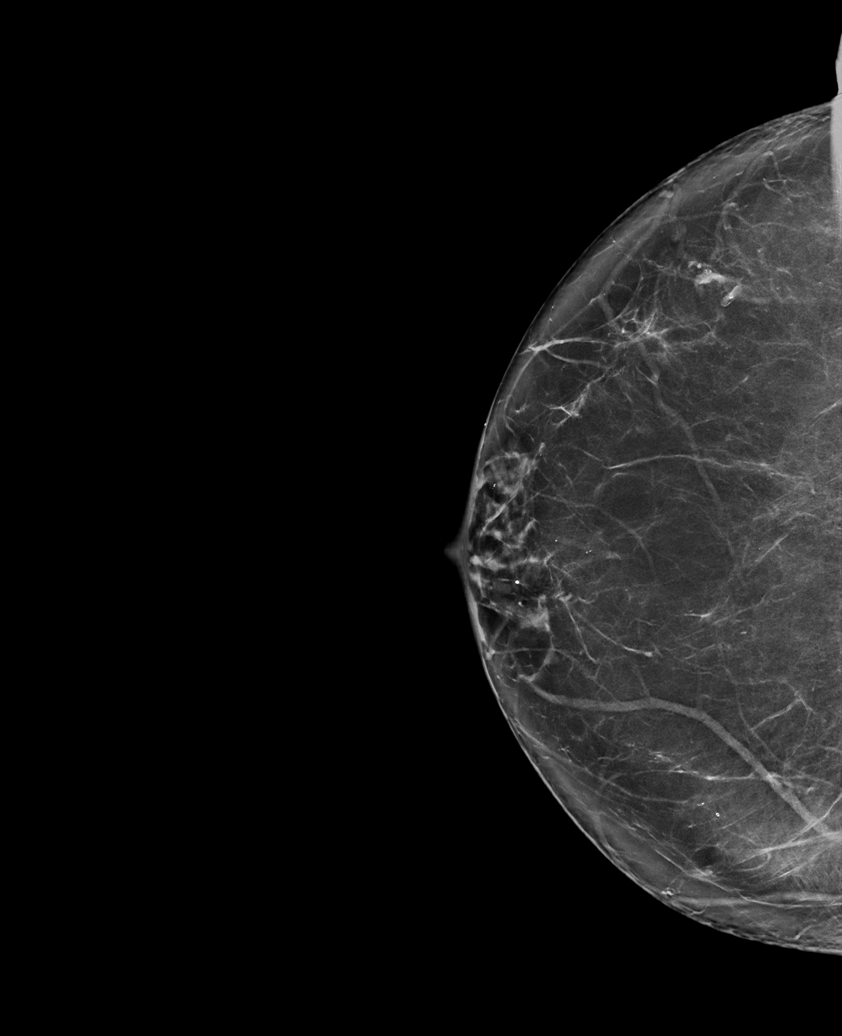

[L MLO synth-2D (1 of 2)]
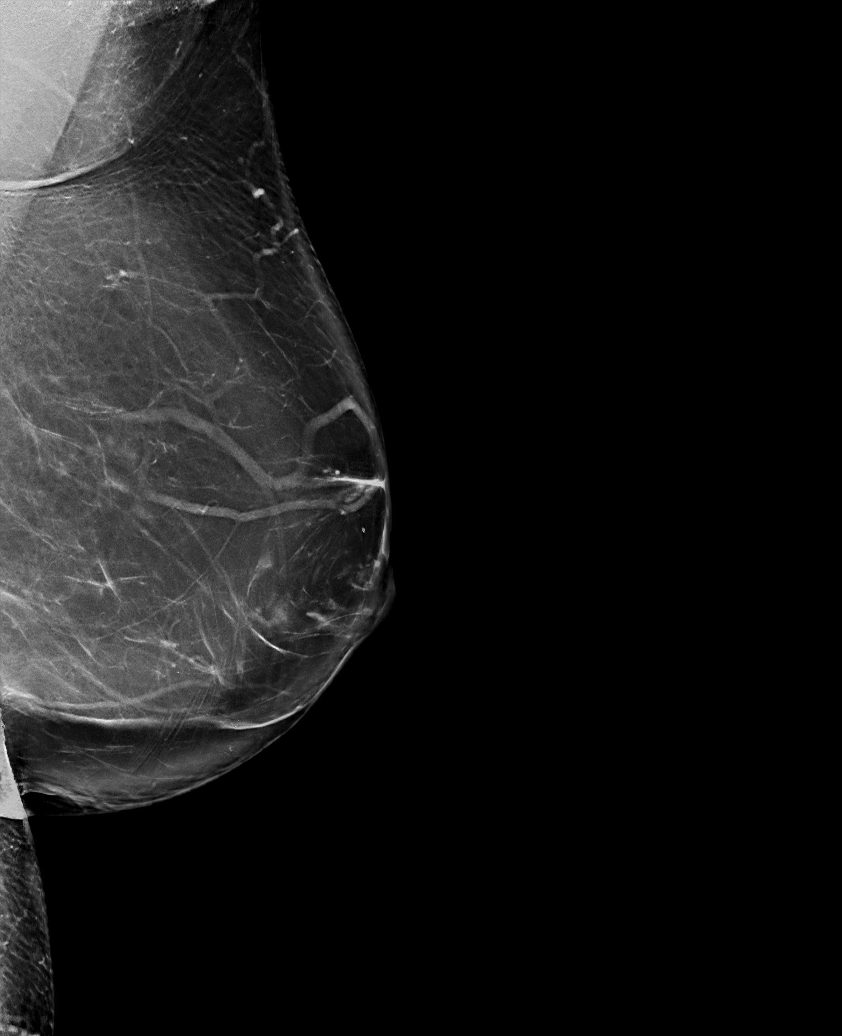

[L MLO synth-2D (2 of 2)]
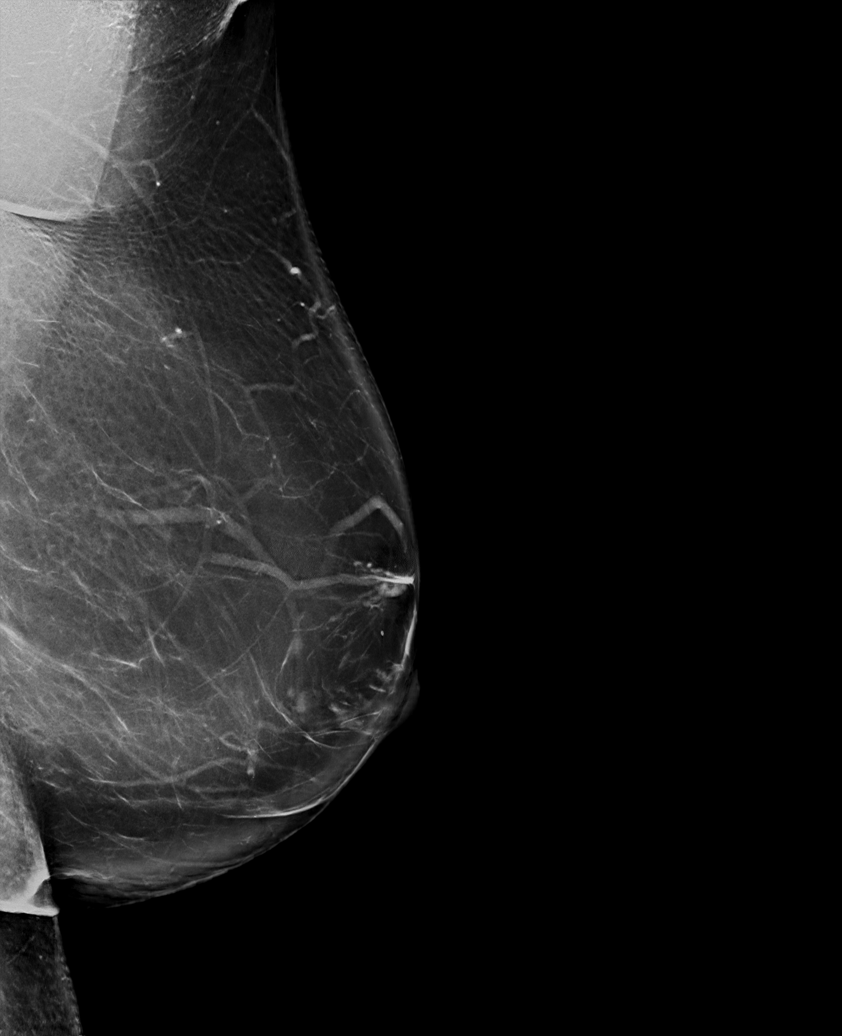

[L CC synth-2D]
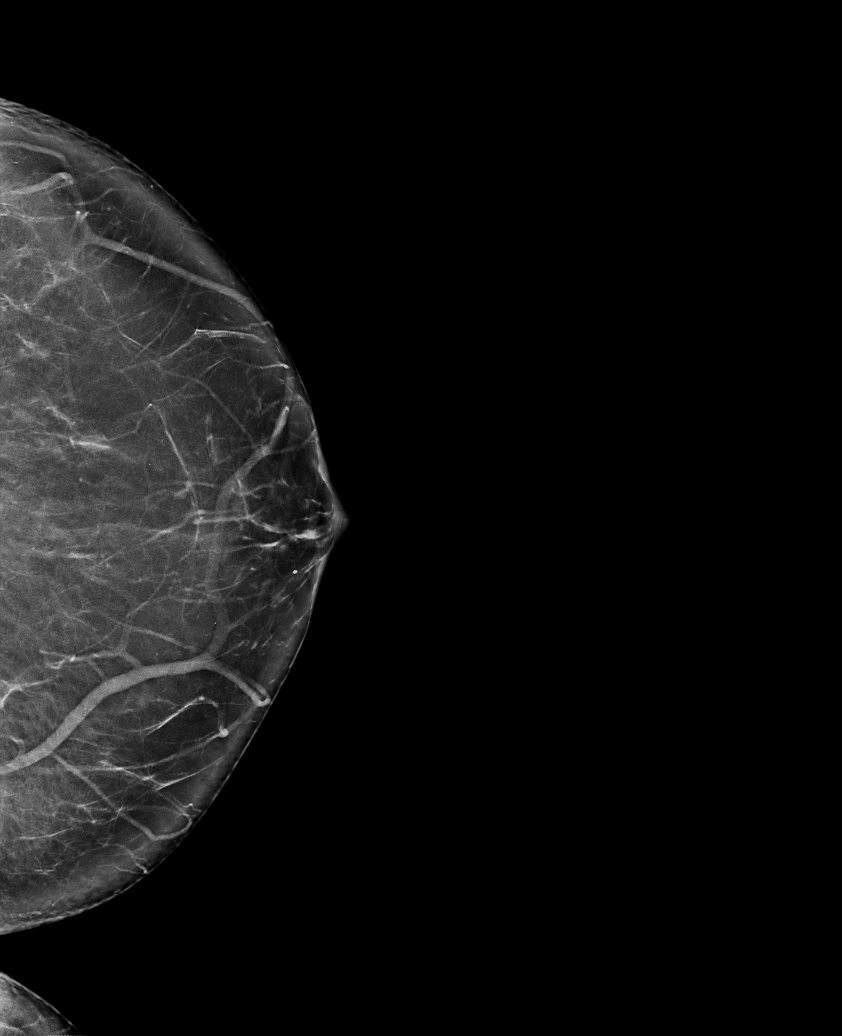

[R MLO synth-2D]
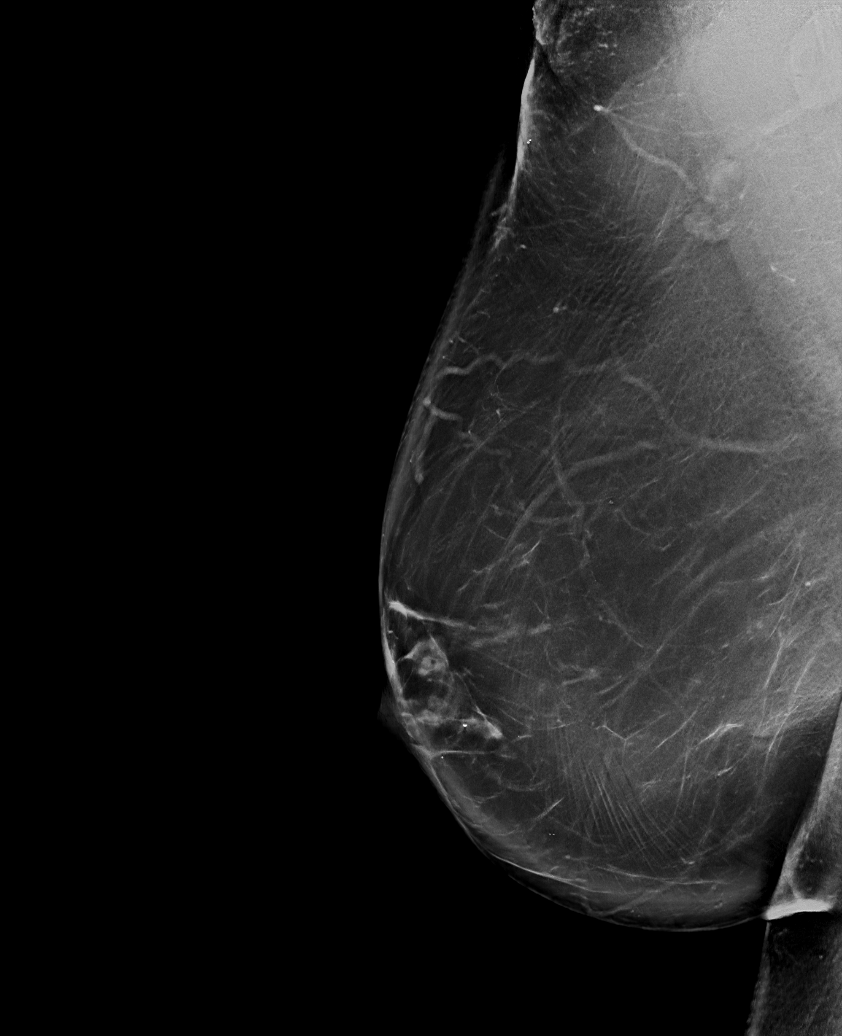

[L MLO tomo · tomo slice 49/98.0]
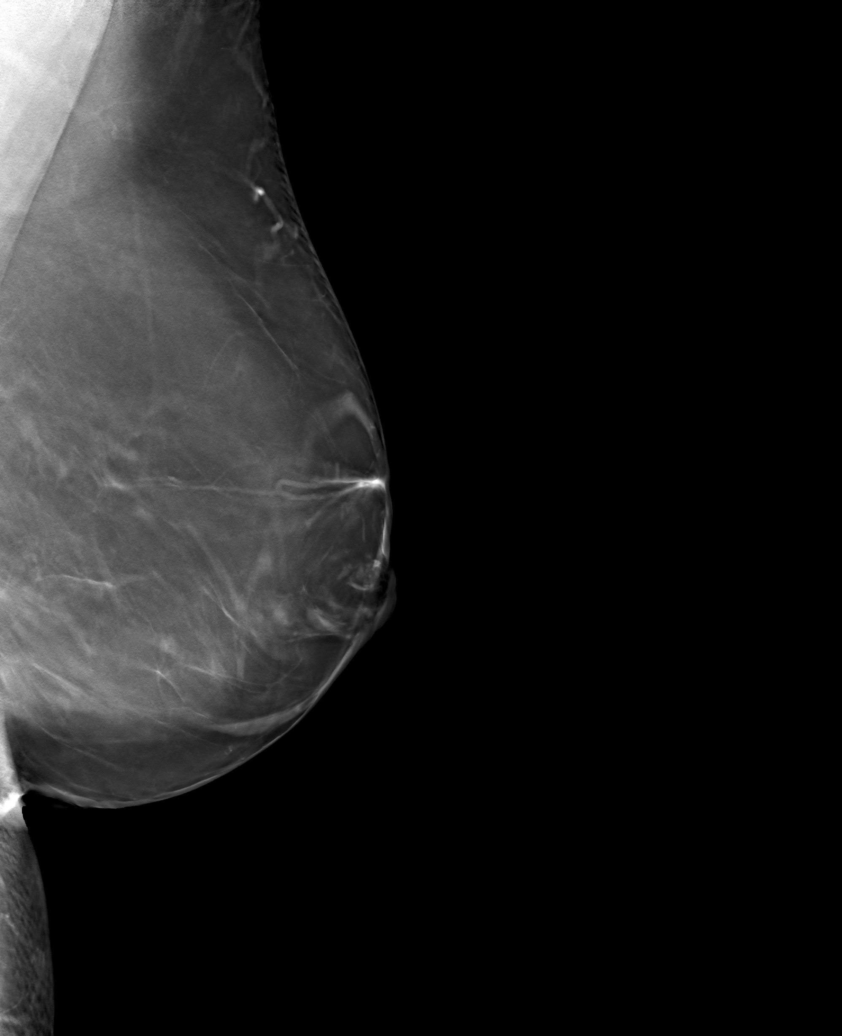

[6 of 30 positions shown; findings below may reference images not displayed]

ACR Breast Density Category b: There are scattered areas of
fibroglandular density.
FINDINGS: There are no findings suspicious for malignancy.
IMPRESSION: No mammographic evidence of malignancy. A result letter of this
screening mammogram will be mailed directly to the patient.

RECOMMENDATION:
Screening mammogram in one year. (Code:51-O-LD2)

BI-RADS CATEGORY  1: Negative.

## 2022-05-06 NOTE — Chronic Care Management (AMB) (Signed)
Chronic Care Management   CCM RN Visit Note  05/06/2022 Name: Anna Greene MRN: [id number] DOB: 22-Jan-1955  Subjective: Anna Greene is a 67 y.o. year old female who is a primary care patient of Cox, Kirsten, MD. The patient was referred to the Chronic Care Management team for assistance with care management needs subsequent to provider initiation of CCM services and plan of care.    Today's Visit:  Engaged with patient by telephone for follow up visit.        Goals Addressed             This Visit's Progress    CCM Expected Outcome:  Monitor, Self-Manage and Reduce Symptoms of Diabetes       Current Barriers:  Care Coordination needs related to cost of Victoza and current pap in a patient with DM Chronic Disease Management support and education needs related to effective management of DM Financial Constraints.  Lab Results  Component Value Date   HGBA1C 6.1 (H) 01/28/2022     Planned Interventions: Provided education to patient about basic DM disease process. The patient has good control of her DM. Her A1C is slightly elevated from 5.9% to 6.1% on 01-28-2022. ; Reviewed medications with patient and discussed importance of medication adherence. The patient continues to work with pharm D for Victoza PAP assistance. The patient now has her Victoza. States that she is staying on top of it. She denies any acute changes in her medications at this time. Knows to call for changes.  Reviewed prescribed diet with patient heart healthy/ADA diet. Review and education given ; Counseled on importance of regular laboratory monitoring as prescribed. Had labs done in January. A1C up slightly. Has regular lab work;        Discussed plans with patient for ongoing care management follow up and provided patient with direct contact information for care management team;      Provided patient with written educational materials related to hypo and hyperglycemia and importance of correct treatment;        Reviewed scheduled/upcoming provider appointments including: 07-15-2022;         Advised patient, providing education and rationale, to check cbg when you have symptoms of low or high blood sugar and record. The patient states the highest blood sugar she has seen was 152. The patient states that it is usually very good. The only time it gets out of range is if she eats cake or something like that. She states she does not let it get like that often.         call provider for findings outside established parameters;       Referral made to pharmacy team for assistance with medication questions and assistance with PAP for Victoza. Is on pill packaging system. Has been approved for 2024 and received her letter. The patient has not received the medications yet. Message sent to the pharm D.    Review of patient status, including review of consultants reports, relevant laboratory and other test results, and medications completed;       Advised patient to discuss changes in DM or chronic conditions with provider;      Screening for signs and symptoms of depression related to chronic disease state;        Assessed social determinant of health barriers;         Symptom Management: Take medications as prescribed   Attend all scheduled provider appointments Call pharmacy for medication refills 3-7 days in advance of  running out of medications Call provider office for new concerns or questions  call the Suicide and Crisis Lifeline: 988 call the [place] National Suicide Prevention Lifeline: 617-811-8226 or TTY: 567-563-5810 TTY 765-045-9508) to talk to a trained counselor call 1-800-273-TALK (toll free, 24 hour hotline) if experiencing a Mental Health or Behavioral Health Crisis  check blood sugar at prescribed times: when you have symptoms of low or high blood sugar check feet daily for cuts, sores or redness trim toenails straight across wash and dry feet carefully every day wear comfortable, cotton  socks wear comfortable, well-fitting shoes  Follow Up Plan: Telephone follow up appointment with care management team member scheduled for: 06-24-2022 at 0945 am       CCM Expected Outcome:  Monitor, Self-Manage, and Reduce Symptoms of Hypertension       Current Barriers:  Chronic Disease Management support and education needs related to effective management of HTN Financial Constraints.  BP Readings from Last 3 Encounters:  02/14/22 132/70  02/04/22 128/78  10/01/21 122/82     Planned Interventions: Evaluation of current treatment plan related to hypertension self management and patient's adherence to plan as established by provider. The patient states her blood pressures have been stable. The patient saw the pcp in January and February and blood pressures was stable. Denies any acute changes at this time. States that she is glad everything is doing well at this time.  Provided education to patient re: stroke prevention, s/s of heart attack and stroke; Reviewed prescribed diet heart healthy/ADA diet. Review and education. The patient is compliant with dietary restrictions Reviewed medications with patient and discussed importance of compliance. Patient is compliant with medications. Denies any medication needs related to her HTN and heart health;  Discussed plans with patient for ongoing care management follow up and provided patient with direct contact information for care management team; Reviewed scheduled/upcoming provider appointments including: 07-15-2022 at 1040 am. 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   Symptom Management: Take medications as prescribed   Attend all scheduled provider appointments Call pharmacy for medication refills 3-7 days in advance of running out of medications Call provider office for new concerns or questions  call the Suicide and Crisis Lifeline: 988 call the [place] National Suicide Prevention Lifeline: (775)332-5350 or TTY: (720)024-6697 TTY (201)263-0104) to talk to a trained counselor call 1-800-273-TALK (toll free, 24 hour hotline) if experiencing a Mental Health or Behavioral Health Crisis  learn about high blood pressure call doctor for signs and symptoms of high blood pressure develop an action plan for high blood pressure keep all doctor appointments take medications for blood pressure exactly as prescribed report new symptoms to your doctor  Follow Up Plan: Telephone follow up appointment with care management team member scheduled for: 06-24-2022 at 0945 am          Plan:Telephone follow up appointment with care management team member scheduled for:  06-24-2022 at 0945 am  Alto Denver RN, MSN, CCM RN Care Manager  Chronic Care Management Direct Number: [phone]

## 2022-05-06 NOTE — Patient Instructions (Signed)
Please call the care guide team at [phone] if you need to cancel or reschedule your appointment.   If you are experiencing a Mental Health or Behavioral Health Crisis or need someone to talk to, please call the Suicide and Crisis Lifeline: 988 call the [place] National Suicide Prevention Lifeline: 845 386 7295 or TTY: 706-206-5364 TTY 825-201-9693) to talk to a trained counselor call 1-800-273-TALK (toll free, 24 hour hotline) go to [facility] [address], [place] [phone])   Following is a copy of the CCM Program Consent:  CCM service includes personalized support from designated clinical staff supervised by the physician, including individualized plan of care and coordination with other care providers 24/7 contact phone numbers for assistance for urgent and routine care needs. Service will only be billed when office clinical staff spend 20 minutes or more in a month to coordinate care. Only one practitioner may furnish and bill the service in a calendar month. The patient may stop CCM services at amy time (effective at the end of the month) by phone call to the office staff. The patient will be responsible for cost sharing (co-pay) or up to 20% of the service fee (after annual deductible is met)  Following is a copy of your full provider care plan:   Goals Addressed             This Visit's Progress    CCM Expected Outcome:  Monitor, Self-Manage and Reduce Symptoms of Diabetes       Current Barriers:  Care Coordination needs related to cost of Victoza and current pap in a patient with DM Chronic Disease Management support and education needs related to effective management of DM Financial Constraints.  Lab Results  Component Value Date   HGBA1C 6.1 (H) 01/28/2022     Planned Interventions: Provided education to patient about basic DM disease process. The patient has good control of her DM. Her A1C is slightly elevated from 5.9%  to 6.1% on 01-28-2022. ; Reviewed medications with patient and discussed importance of medication adherence. The patient continues to work with pharm D for Victoza PAP assistance. The patient now has her Victoza. States that she is staying on top of it. She denies any acute changes in her medications at this time. Knows to call for changes.  Reviewed prescribed diet with patient heart healthy/ADA diet. Review and education given ; Counseled on importance of regular laboratory monitoring as prescribed. Had labs done in January. A1C up slightly. Has regular lab work;        Discussed plans with patient for ongoing care management follow up and provided patient with direct contact information for care management team;      Provided patient with written educational materials related to hypo and hyperglycemia and importance of correct treatment;       Reviewed scheduled/upcoming provider appointments including: 07-15-2022;         Advised patient, providing education and rationale, to check cbg when you have symptoms of low or high blood sugar and record. The patient states the highest blood sugar she has seen was 152. The patient states that it is usually very good. The only time it gets out of range is if she eats cake or something like that. She states she does not let it get like that often.         call provider for findings outside established parameters;       Referral made to pharmacy team for assistance with medication questions  and assistance with PAP for Victoza. Is on pill packaging system. Has been approved for 2024 and received her letter. The patient has not received the medications yet. Message sent to the pharm D.    Review of patient status, including review of consultants reports, relevant laboratory and other test results, and medications completed;       Advised patient to discuss changes in DM or chronic conditions with provider;      Screening for signs and symptoms of depression related  to chronic disease state;        Assessed social determinant of health barriers;         Symptom Management: Take medications as prescribed   Attend all scheduled provider appointments Call pharmacy for medication refills 3-7 days in advance of running out of medications Call provider office for new concerns or questions  call the Suicide and Crisis Lifeline: 988 call the [place] National Suicide Prevention Lifeline: 513-656-3485 or TTY: 301-666-1044 TTY (617)452-0267) to talk to a trained counselor call 1-800-273-TALK (toll free, 24 hour hotline) if experiencing a Mental Health or Behavioral Health Crisis  check blood sugar at prescribed times: when you have symptoms of low or high blood sugar check feet daily for cuts, sores or redness trim toenails straight across wash and dry feet carefully every day wear comfortable, cotton socks wear comfortable, well-fitting shoes  Follow Up Plan: Telephone follow up appointment with care management team member scheduled for: 06-24-2022 at 0945 am       CCM Expected Outcome:  Monitor, Self-Manage, and Reduce Symptoms of Hypertension       Current Barriers:  Chronic Disease Management support and education needs related to effective management of HTN Financial Constraints.  BP Readings from Last 3 Encounters:  02/14/22 132/70  02/04/22 128/78  10/01/21 122/82     Planned Interventions: Evaluation of current treatment plan related to hypertension self management and patient's adherence to plan as established by provider. The patient states her blood pressures have been stable. The patient saw the pcp in January and February and blood pressures was stable. Denies any acute changes at this time. States that she is glad everything is doing well at this time.  Provided education to patient re: stroke prevention, s/s of heart attack and stroke; Reviewed prescribed diet heart healthy/ADA diet. Review and education. The patient is compliant with dietary  restrictions Reviewed medications with patient and discussed importance of compliance. Patient is compliant with medications. Denies any medication needs related to her HTN and heart health;  Discussed plans with patient for ongoing care management follow up and provided patient with direct contact information for care management team; Reviewed scheduled/upcoming provider appointments including: 07-15-2022 at 1040 am. 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   Symptom Management: Take medications as prescribed   Attend all scheduled provider appointments Call pharmacy for medication refills 3-7 days in advance of running out of medications Call provider office for new concerns or questions  call the Suicide and Crisis Lifeline: 988 call the [place] National Suicide Prevention Lifeline: (708)265-3701 or TTY: (678)729-9200 TTY (  847-887-0586) to talk to a trained counselor call 1-800-273-TALK (toll free, 24 hour hotline) if experiencing a Mental Health or Behavioral Health Crisis  learn about high blood pressure call doctor for signs and symptoms of high blood pressure develop an action plan for high blood pressure keep all doctor appointments take medications for blood pressure exactly as prescribed report new symptoms to your doctor  Follow Up Plan: Telephone follow up appointment with care management team member scheduled for: 06-24-2022 at 0945 am          Patient verbalizes understanding  of instructions and care plan provided today and agrees to view in MyChart. Active MyChart status and patient understanding of how to access instructions and care plan via MyChart confirmed with patient.  Telephone follow up appointment with care management team member scheduled for: 06-24-2022 at 0945 am

## 2022-05-07 ENCOUNTER — Telehealth: Payer: Self-pay

## 2022-05-07 ENCOUNTER — Other Ambulatory Visit: Payer: Self-pay

## 2022-05-07 DIAGNOSIS — F411 Generalized anxiety disorder: Secondary | ICD-10-CM

## 2022-05-07 DIAGNOSIS — M5136 Other intervertebral disc degeneration, lumbar region: Secondary | ICD-10-CM

## 2022-05-07 DIAGNOSIS — I152 Hypertension secondary to endocrine disorders: Secondary | ICD-10-CM

## 2022-05-07 DIAGNOSIS — R300 Dysuria: Secondary | ICD-10-CM | POA: Diagnosis not present

## 2022-05-07 DIAGNOSIS — N898 Other specified noninflammatory disorders of vagina: Secondary | ICD-10-CM | POA: Diagnosis not present

## 2022-05-07 MED ORDER — CLONAZEPAM 1 MG PO TABS: ORAL_TABLET | ORAL | 2 refills | Status: DC

## 2022-05-07 MED ORDER — OMEGA-3-ACID ETHYL ESTERS 1 G PO CAPS: 2.0000 g | ORAL_CAPSULE | Freq: Two times a day (BID) | ORAL | 0 refills | Status: DC

## 2022-05-07 MED ORDER — CELECOXIB 200 MG PO CAPS: 200.0000 mg | ORAL_CAPSULE | Freq: Two times a day (BID) | ORAL | 1 refills | Status: DC

## 2022-05-07 MED ORDER — LOSARTAN POTASSIUM 50 MG PO TABS: 50.0000 mg | ORAL_TABLET | Freq: Every day | ORAL | 1 refills | Status: DC

## 2022-05-07 NOTE — Progress Notes (Unsigned)
Care Management & Coordination Services Pharmacy Team  Reason for Encounter: Medication coordination and delivery  Contacted patient to discuss medications and coordinate delivery from [facility]. {US HC Outreach:28874} Cycle dispensing form sent to *** for review.   Last adherence delivery date: 04-18-2022  Patient is due for next adherence delivery on: 05-17-2022  This delivery to include: Adherence Packaging  30 Days  Clonazepam 1 mg at bedtime PRN- Vials Calcium/Vitamin D3 600-800 1 at breakfast and 1 at evening meal Cetirizine  1 at evening meal Potassium Chloride Er 1 at Breakfast Omeprazole  1 at breakfast Losartan  1 at breakfast Atenolol-Chlorthalidone 100-25mg  1 at breakfast Celecoxib  1 at breakfast and 1 at evening meal Rosuvastatin  1 at evening meal Tizanidine -1 tab every 8 hours as needed- Vials Terbinafine 250 mg 1 at breakfast Omega 3 1 gm 2 at B 2 with EM  Patient declined the following medications this month: Victoza /1ml - Gets PAP    Refills requested from providers include: Clonazepam  Losartan Celecoxib  Omega 3   {Delivery ZHYQ:65784}   Any concerns about your medications? {yes/no:20286}  How often do you forget or accidentally miss a dose? {Missed doses:25554}  Do you use a pillbox? {yes/no:20286}  Is patient in packaging {yes/no:20286}  If yes  What is the date on your next pill pack?  Any concerns or issues with your packaging?   Recent blood pressure readings are as follows:***  Recent blood glucose readings are as follows:***   Chart review: Recent office visits:  04-24-2022 Mobile mammogram completed.  Recent consult visits:  None  Hospital visits:  None in previous 6 months  Medications: Outpatient Encounter Medications as of 05/07/2022  Medication Sig   amoxicillin (AMOXIL) 500 MG capsule TAKE FOUR CAPSULES BY MOUTH ONE hour prior TO denal procedures   amoxicillin-clavulanate  (AUGMENTIN) 875-125 MG tablet Take 1 tablet by mouth 2 (two) times daily.   atenolol-chlorthalidone (TENORETIC) 100-25 MG tablet Take 1 tablet by mouth daily.   celecoxib (CELEBREX) 200 MG capsule Take 1 capsule (200 mg total) by mouth 2 (two) times daily.   cetirizine (ZYRTEC) 10 MG tablet Take 1 tablet (10 mg total) by mouth daily.   clonazePAM (KLONOPIN) 1 MG tablet TAKE ONE TABLET BY MOUTH AT bedtime AS NEEDED FOR ANXIETY   Insulin Pen Needle 32G X 6 MM MISC 1 each by Does not apply route daily. Use with victoza samples   liraglutide (VICTOZA) 18 MG/3ML SOPN Inject 1.8 mg into the skin daily. PAP Approved 2023   losartan (COZAAR) 50 MG tablet Take 1 tablet (50 mg total) by mouth daily.   omega-3 acid ethyl esters (LOVAZA) 1 g capsule Take 2 capsules (2 g total) by mouth 2 (two) times daily.   omeprazole (PRILOSEC) 20 MG capsule Take 1 capsule (20 mg total) by mouth every morning.   potassium chloride (KLOR-CON) 10 MEQ tablet Take 1 tablet (10 mEq total) by mouth every morning.   promethazine-dextromethorphan (PROMETHAZINE-DM) 6.25-15 MG/5ML syrup Take 5 mLs by mouth 4 (four) times daily as needed.   rosuvastatin (CRESTOR) 5 MG tablet TAKE ONE TABLET BY MOUTH EVERY EVENING   terbinafine (LAMISIL) 250 MG tablet Take 1 tablet (250 mg total) by mouth daily.   tiZANidine (ZANAFLEX) 4 MG tablet TAKE ONE TABLET BY MOUTH every EIGHT hours AS NEEDED FOR MUSCLE SPASMS   No facility-administered encounter medications on file as of 05/07/2022.   BP Readings from Last 3 Encounters:  02/14/22 132/70  02/04/22  128/78  10/01/21 122/82    Pulse Readings from Last 3 Encounters:  02/14/22 82  02/04/22 78  10/01/21 80    Lab Results  Component Value Date/Time   HGBA1C 6.1 (H) 01/28/2022 09:24 AM   HGBA1C 5.9 (H) 09/24/2021 08:50 AM   Lab Results  Component Value Date   CREATININE 0.99 01/28/2022   BUN 21 01/28/2022   GFRNONAA 74 02/21/2020   GFRAA 85 02/21/2020   NA 138 01/28/2022   K 4.4  01/28/2022   CALCIUM 9.7 01/28/2022   CO2 26 01/28/2022     Malecca Hicks CMA Clinical Pharmacist Assistant [phone]

## 2022-05-07 NOTE — Telephone Encounter (Signed)
Pt called today to request a same day appointment for the following symptoms:uti. Unfortunately our schedule is full and we have no openings for today or tomorrow. Pt was notified to go to Urgent Care.

## 2022-05-08 ENCOUNTER — Other Ambulatory Visit: Payer: Medicare HMO

## 2022-05-13 ENCOUNTER — Other Ambulatory Visit: Payer: Self-pay | Admitting: Family Medicine

## 2022-05-14 DIAGNOSIS — I10 Essential (primary) hypertension: Secondary | ICD-10-CM | POA: Diagnosis not present

## 2022-05-14 DIAGNOSIS — Z794 Long term (current) use of insulin: Secondary | ICD-10-CM | POA: Diagnosis not present

## 2022-05-14 DIAGNOSIS — M47816 Spondylosis without myelopathy or radiculopathy, lumbar region: Secondary | ICD-10-CM | POA: Diagnosis not present

## 2022-05-14 DIAGNOSIS — E1159 Type 2 diabetes mellitus with other circulatory complications: Secondary | ICD-10-CM | POA: Diagnosis not present

## 2022-05-23 ENCOUNTER — Telehealth: Payer: Self-pay

## 2022-05-23 NOTE — Progress Notes (Signed)
Novo Nordisk patient assistance program notification:  120- day supply of Victoza should arrive to the office in 10-14 business days. Patient has 1  refill remaining and enrollment will expire on 01/14/2023. Next refill will be fulfilled on 08/09/2022.    Billee Cashing, CMA Clinical Pharmacist Assistant [phone]

## 2022-06-04 ENCOUNTER — Telehealth: Payer: Self-pay

## 2022-06-04 NOTE — Telephone Encounter (Signed)
Patient was notified that victoza from patient assistance was received and she can come by to pick up.

## 2022-06-05 ENCOUNTER — Telehealth: Payer: Self-pay

## 2022-06-05 NOTE — Progress Notes (Signed)
Care Management & Coordination Services Pharmacy Team  Reason for Encounter: Medication coordination and delivery  Contacted patient to discuss medications and coordinate delivery from [facility]. Spoke with patient on 06/05/2022  Cycle dispensing form sent to Anna Greene for review.   Last adherence delivery date: 05-17-2022  Patient is due for next adherence delivery on: 06-18-2022   This delivery to include: Adherence Packaging  30 Days  Clonazepam 1 mg at bedtime PRN- Vials Calcium/Vitamin D3 600-800 1 at breakfast and 1 at evening meal Cetirizine 10mg  1 at evening meal Potassium Chloride Er 1 at Breakfast Omeprazole 20mg  1 at breakfast Losartan 50mg  1 at breakfast Atenolol-Chlorthalidone 100-25mg  1 at breakfast Celecoxib 200mg  1 at breakfast and 1 at evening meal Rosuvastatin 5mg  1 at evening meal Tizanidine 4mg -1 tab every 8 hours as needed- Vials Omega 3 1 gm 2 at B 2 with EM  Patient declined the following medications this month: Victoza- PAP Terbinafine- No longer needed  Refills requested from providers include: Omega 3- Refill request sent to specialist [facility]  Confirmed delivery date of 06-18-2022, advised patient that pharmacy will contact them the morning of delivery.   Any concerns about your medications? No  How often do you forget or accidentally miss a dose? Never  Do you use a pillbox? No  Is patient in packaging Yes  Any concerns or issues with your packaging? No   Recent blood pressure readings are as follows: 05-15 133/68, 05-18 147/72   Chart review: Recent office visits:  None  Recent consult visits:  None  Hospital visits:  None in previous 6 months  Medications: Outpatient Encounter Medications as of 06/05/2022  Medication Sig   amoxicillin (AMOXIL) 500 MG capsule TAKE FOUR CAPSULES BY MOUTH ONE hour prior TO denal procedures   amoxicillin-clavulanate (AUGMENTIN) 875-125 MG tablet Take 1 tablet by mouth 2 (two)  times daily.   atenolol-chlorthalidone (TENORETIC) 100-25 MG tablet Take 1 tablet by mouth daily.   celecoxib (CELEBREX) 200 MG capsule Take 1 capsule (200 mg total) by mouth 2 (two) times daily.   cetirizine (ZYRTEC) 10 MG tablet Take 1 tablet (10 mg total) by mouth daily.   clonazePAM (KLONOPIN) 1 MG tablet TAKE ONE TABLET BY MOUTH AT bedtime AS NEEDED FOR ANXIETY   Insulin Pen Needle 32G X 6 MM MISC 1 each by Does not apply route daily. Use with victoza samples   liraglutide (VICTOZA) 18 MG/3ML SOPN Inject 1.8 mg into the skin daily. PAP Approved 2023   losartan (COZAAR) 50 MG tablet Take 1 tablet (50 mg total) by mouth daily.   omega-3 acid ethyl esters (LOVAZA) 1 g capsule Take 2 capsules (2 g total) by mouth 2 (two) times daily.   omeprazole (PRILOSEC) 20 MG capsule Take 1 capsule (20 mg total) by mouth every morning.   potassium chloride (KLOR-CON) 10 MEQ tablet Take 1 tablet (10 mEq total) by mouth every morning.   promethazine-dextromethorphan (PROMETHAZINE-DM) 6.25-15 MG/5ML syrup Take 5 mLs by mouth 4 (four) times daily as needed.   rosuvastatin (CRESTOR) 5 MG tablet TAKE ONE TABLET BY MOUTH EVERY EVENING   terbinafine (LAMISIL) 250 MG tablet Take 1 tablet (250 mg total) by mouth daily.   tiZANidine (ZANAFLEX) 4 MG tablet TAKE ONE TABLET BY MOUTH every EIGHT hours AS NEEDED FOR MUSCLE SPASMS   No facility-administered encounter medications on file as of 06/05/2022.   BP Readings from Last 3 Encounters:  02/14/22 132/70  02/04/22 128/78  10/01/21 122/82  Pulse Readings from Last 3 Encounters:  02/14/22 82  02/04/22 78  10/01/21 80    Lab Results  Component Value Date/Time   HGBA1C 6.1 (H) 01/28/2022 09:24 AM   HGBA1C 5.9 (H) 09/24/2021 08:50 AM   Lab Results  Component Value Date   CREATININE 0.99 01/28/2022   BUN 21 01/28/2022   GFRNONAA 74 02/21/2020   GFRAA 85 02/21/2020   NA 138 01/28/2022   K 4.4 01/28/2022   CALCIUM 9.7 01/28/2022   CO2 26 01/28/2022      Anna Greene CMA Clinical Pharmacist Assistant [phone]

## 2022-06-13 ENCOUNTER — Other Ambulatory Visit: Payer: Self-pay | Admitting: Family Medicine

## 2022-06-13 NOTE — Telephone Encounter (Signed)
PT PICKED UP PATIENCE ASSISTANCE SIGNED BLUE FORM LEFT IN PHARM BOX

## 2022-06-17 ENCOUNTER — Other Ambulatory Visit: Payer: Medicare HMO

## 2022-06-17 DIAGNOSIS — M47816 Spondylosis without myelopathy or radiculopathy, lumbar region: Secondary | ICD-10-CM | POA: Diagnosis not present

## 2022-06-24 ENCOUNTER — Ambulatory Visit (INDEPENDENT_AMBULATORY_CARE_PROVIDER_SITE_OTHER): Payer: Medicare HMO

## 2022-06-24 ENCOUNTER — Telehealth: Payer: Medicare HMO

## 2022-06-24 ENCOUNTER — Ambulatory Visit: Payer: Medicare HMO | Admitting: Family Medicine

## 2022-06-24 DIAGNOSIS — I152 Hypertension secondary to endocrine disorders: Secondary | ICD-10-CM

## 2022-06-24 DIAGNOSIS — E1165 Type 2 diabetes mellitus with hyperglycemia: Secondary | ICD-10-CM

## 2022-06-24 NOTE — Chronic Care Management (AMB) (Signed)
Chronic Care Management   CCM RN Visit Note  06/24/2022 Name: Anna Greene MRN: [id number] DOB: 1955/01/21  Subjective: Anna Greene is a 67 y.o. year old female who is a primary care patient of Cox, Kirsten, MD. The patient was referred to the Chronic Care Management team for assistance with care management needs subsequent to provider initiation of CCM services and plan of care.    Today's Visit:  Engaged with patient by telephone for follow up visit.        Goals Addressed             This Visit's Progress    CCM Expected Outcome:  Monitor, Self-Manage and Reduce Symptoms of Diabetes       Current Barriers:  Care Coordination needs related to cost of Victoza and current pap in a patient with DM Chronic Disease Management support and education needs related to effective management of DM Financial Constraints.  Lab Results  Component Value Date   HGBA1C 6.1 (H) 01/28/2022     Planned Interventions: Provided education to patient about basic DM disease process. The patient has good control of her DM. Her A1C is slightly elevated from 5.9% to 6.1% on 01-28-2022. She will have new labs in July. Denies any new concerns related to DM ; Reviewed medications with patient and discussed importance of medication adherence. The patient continues to work with pharm D for Victoza PAP assistance. The patient now has her Victoza. States that she is staying on top of it. She denies any acute changes in her medications at this time. Knows to call for changes.  Reviewed prescribed diet with patient heart healthy/ADA diet. Review and education given ; Counseled on importance of regular laboratory monitoring as prescribed. Had labs done in January. A1C up slightly. Has regular lab work. Next labs in July;        Discussed plans with patient for ongoing care management follow up and provided patient with direct contact information for care management team;      Provided patient with written educational  materials related to hypo and hyperglycemia and importance of correct treatment. Denies any sx and sx of hypo or hyperglycemias;       Reviewed scheduled/upcoming provider appointments including: 07-15-2022;         Advised patient, providing education and rationale, to check cbg when you have symptoms of low or high blood sugar and record. The patient does not check her blood sugars daily. She knows when her blood sugars are out of range.  The only time it gets out of range is if she eats cake or something like that. She states she does not let it get like that often.         call provider for findings outside established parameters;       Referral made to pharmacy team for assistance with medication questions and assistance with PAP for Victoza. Is on pill packaging system. Has been approved for 2024 and received her letter. The patient has not received the medications yet. Message sent to the pharm D.    Review of patient status, including review of consultants reports, relevant laboratory and other test results, and medications completed;       Advised patient to discuss changes in DM or chronic conditions with provider;      Screening for signs and symptoms of depression related to chronic disease state;        Assessed social determinant of health barriers;  Symptom Management: Take medications as prescribed   Attend all scheduled provider appointments Call pharmacy for medication refills 3-7 days in advance of running out of medications Call provider office for new concerns or questions  call the Suicide and Crisis Lifeline: 988 call the [place] National Suicide Prevention Lifeline: 423-132-9157 or TTY: 313-338-0029 TTY 772-255-3643) to talk to a trained counselor call 1-800-273-TALK (toll free, 24 hour hotline) if experiencing a Mental Health or Behavioral Health Crisis  check blood sugar at prescribed times: when you have symptoms of low or high blood sugar check feet daily for cuts,  sores or redness trim toenails straight across wash and dry feet carefully every day wear comfortable, cotton socks wear comfortable, well-fitting shoes  Follow Up Plan: Telephone follow up appointment with care management team member scheduled for: 08-19-2022 at 0945 am       CCM Expected Outcome:  Monitor, Self-Manage, and Reduce Symptoms of Hypertension       Current Barriers:  Chronic Disease Management support and education needs related to effective management of HTN Financial Constraints.  BP Readings from Last 3 Encounters:  02/14/22 132/70  02/04/22 128/78  10/01/21 122/82     Planned Interventions: Evaluation of current treatment plan related to hypertension self management and patient's adherence to plan as established by provider. The patient states her blood pressures have been stable. The patient states she feels good and denies any acute changes in her HTN or heart health. 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  Provided education to patient re: stroke prevention, s/s of heart attack and stroke; Reviewed prescribed diet heart healthy/ADA diet. Review and education. The patient is compliant with dietary restrictions Reviewed medications with patient and discussed importance of compliance. Patient is compliant with medications. Denies any medication needs related to her HTN and heart health;  Discussed plans with patient for ongoing care management follow up and provided patient with direct contact information for care management team; Reviewed scheduled/upcoming provider appointments including: 07-15-2022 at 1040 am. 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   Symptom Management: Take medications as prescribed   Attend all scheduled provider appointments Call pharmacy for medication refills 3-7 days in advance of running out of medications Call provider office for new concerns or questions  call the Suicide and Crisis Lifeline: 988 call the [place] National Suicide Prevention Lifeline: (423) 296-9236 or TTY: 626-127-2497 TTY 248-346-4672) to talk to a trained counselor call 1-800-273-TALK (toll free, 24 hour hotline) if experiencing a Mental Health or Behavioral Health Crisis  learn about high blood pressure call doctor for signs and symptoms of high blood pressure develop an action plan for high blood pressure keep all doctor appointments take medications for blood pressure exactly as prescribed report new symptoms to your doctor  Follow Up Plan: Telephone follow up appointment with care management team member scheduled for: 08-19-2022 at 0945 am          Plan:Telephone follow up appointment  with care management team member scheduled for:  08-19-2022  Alto Denver RN, MSN, CCM RN Care Manager  Chronic Care Management Direct Number: [phone]

## 2022-06-24 NOTE — Patient Instructions (Signed)
Please call the care guide team at [phone] if you need to cancel or reschedule your appointment.   If you are experiencing a Mental Health or Behavioral Health Crisis or need someone to talk to, please call the Suicide and Crisis Lifeline: 988 call the [place] National Suicide Prevention Lifeline: 856-458-3104 or TTY: 737-260-9231 TTY 941-493-4986) to talk to a trained counselor call 1-800-273-TALK (toll free, 24 hour hotline) go to [facility] [address], [place] [phone])   Following is a copy of the CCM Program Consent:  CCM service includes personalized support from designated clinical staff supervised by the physician, including individualized plan of care and coordination with other care providers 24/7 contact phone numbers for assistance for urgent and routine care needs. Service will only be billed when office clinical staff spend 20 minutes or more in a month to coordinate care. Only one practitioner may furnish and bill the service in a calendar month. The patient may stop CCM services at amy time (effective at the end of the month) by phone call to the office staff. The patient will be responsible for cost sharing (co-pay) or up to 20% of the service fee (after annual deductible is met)  Following is a copy of your full provider care plan:   Goals Addressed             This Visit's Progress    CCM Expected Outcome:  Monitor, Self-Manage and Reduce Symptoms of Diabetes       Current Barriers:  Care Coordination needs related to cost of Victoza and current pap in a patient with DM Chronic Disease Management support and education needs related to effective management of DM Financial Constraints.  Lab Results  Component Value Date   HGBA1C 6.1 (H) 01/28/2022     Planned Interventions: Provided education to patient about basic DM disease process. The patient has good control of her DM. Her A1C is slightly elevated from 5.9%  to 6.1% on 01-28-2022. She will have new labs in July. Denies any new concerns related to DM ; Reviewed medications with patient and discussed importance of medication adherence. The patient continues to work with pharm D for Victoza PAP assistance. The patient now has her Victoza. States that she is staying on top of it. She denies any acute changes in her medications at this time. Knows to call for changes.  Reviewed prescribed diet with patient heart healthy/ADA diet. Review and education given ; Counseled on importance of regular laboratory monitoring as prescribed. Had labs done in January. A1C up slightly. Has regular lab work. Next labs in July;        Discussed plans with patient for ongoing care management follow up and provided patient with direct contact information for care management team;      Provided patient with written educational materials related to hypo and hyperglycemia and importance of correct treatment. Denies any sx and sx of hypo or hyperglycemias;       Reviewed scheduled/upcoming provider appointments including: 07-15-2022;         Advised patient, providing education and rationale, to check cbg when you have symptoms of low or high blood sugar and record. The patient does not check her blood sugars daily. She knows when her blood sugars are out of range.  The only time it gets out of range is if she eats cake or something like that. She states she does not let it get like that often.  call provider for findings outside established parameters;       Referral made to pharmacy team for assistance with medication questions and assistance with PAP for Victoza. Is on pill packaging system. Has been approved for 2024 and received her letter. The patient has not received the medications yet. Message sent to the pharm D.    Review of patient status, including review of consultants reports, relevant laboratory and other test results, and medications completed;       Advised patient  to discuss changes in DM or chronic conditions with provider;      Screening for signs and symptoms of depression related to chronic disease state;        Assessed social determinant of health barriers;         Symptom Management: Take medications as prescribed   Attend all scheduled provider appointments Call pharmacy for medication refills 3-7 days in advance of running out of medications Call provider office for new concerns or questions  call the Suicide and Crisis Lifeline: 988 call the [place] National Suicide Prevention Lifeline: 609-289-4981 or TTY: (206)203-8521 TTY 6620653260) to talk to a trained counselor call 1-800-273-TALK (toll free, 24 hour hotline) if experiencing a Mental Health or Behavioral Health Crisis  check blood sugar at prescribed times: when you have symptoms of low or high blood sugar check feet daily for cuts, sores or redness trim toenails straight across wash and dry feet carefully every day wear comfortable, cotton socks wear comfortable, well-fitting shoes  Follow Up Plan: Telephone follow up appointment with care management team member scheduled for: 08-19-2022 at 0945 am       CCM Expected Outcome:  Monitor, Self-Manage, and Reduce Symptoms of Hypertension       Current Barriers:  Chronic Disease Management support and education needs related to effective management of HTN Financial Constraints.  BP Readings from Last 3 Encounters:  02/14/22 132/70  02/04/22 128/78  10/01/21 122/82     Planned Interventions: Evaluation of current treatment plan related to hypertension self management and patient's adherence to plan as established by provider. The patient states her blood pressures have been stable. The patient states she feels good and denies any acute changes in her HTN or heart health. 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  Provided education to patient re: stroke prevention, s/s of heart attack and stroke; Reviewed prescribed diet heart healthy/ADA diet. Review and education. The patient is compliant with dietary restrictions Reviewed medications with patient and discussed importance of compliance. Patient is compliant with medications. Denies any medication needs related to her HTN and heart health;  Discussed plans with patient for ongoing care management follow up and provided patient with direct contact information for care management team; Reviewed scheduled/upcoming provider appointments including: 07-15-2022 at 1040 am. 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   Symptom Management: Take medications as prescribed   Attend all scheduled provider appointments Call pharmacy for medication refills  3-7 days in advance of running out of medications Call provider office for new concerns or questions  call the Suicide and Crisis Lifeline: 988 call the [place] National Suicide Prevention Lifeline: 609-057-4345 or TTY: (414)502-8161 TTY (787)279-3961) to talk to a trained counselor call 1-800-273-TALK (toll free, 24 hour hotline) if experiencing a Mental Health or Behavioral Health Crisis  learn about high blood pressure call doctor for signs and symptoms of high blood pressure develop an action plan for high blood pressure keep all doctor appointments take medications for blood pressure exactly as  prescribed report new symptoms to your doctor  Follow Up Plan: Telephone follow up appointment with care management team member scheduled for: 08-19-2022 at 0945 am          Patient verbalizes understanding of instructions and care plan provided today and agrees to view in MyChart. Active MyChart status and patient understanding of how to access instructions and care plan via MyChart confirmed with patient.  Telephone follow up appointment with care management team member scheduled for: 08-19-2022 at 0900 am

## 2022-07-07 ENCOUNTER — Other Ambulatory Visit: Payer: Self-pay

## 2022-07-07 DIAGNOSIS — I152 Hypertension secondary to endocrine disorders: Secondary | ICD-10-CM

## 2022-07-07 DIAGNOSIS — E1165 Type 2 diabetes mellitus with hyperglycemia: Secondary | ICD-10-CM

## 2022-07-07 DIAGNOSIS — E782 Mixed hyperlipidemia: Secondary | ICD-10-CM

## 2022-07-08 ENCOUNTER — Other Ambulatory Visit: Payer: Medicare HMO

## 2022-07-08 DIAGNOSIS — E1165 Type 2 diabetes mellitus with hyperglycemia: Secondary | ICD-10-CM | POA: Diagnosis not present

## 2022-07-08 DIAGNOSIS — E782 Mixed hyperlipidemia: Secondary | ICD-10-CM

## 2022-07-08 DIAGNOSIS — E1159 Type 2 diabetes mellitus with other circulatory complications: Secondary | ICD-10-CM | POA: Diagnosis not present

## 2022-07-08 DIAGNOSIS — I152 Hypertension secondary to endocrine disorders: Secondary | ICD-10-CM | POA: Diagnosis not present

## 2022-07-09 LAB — LIPID PANEL
Chol/HDL Ratio: 2.6 ratio (ref 0.0–4.4)
Cholesterol, Total: 149 mg/dL (ref 100–199)
HDL: 58 mg/dL (ref >39)
LDL Chol Calc (NIH): 67 mg/dL (ref 0–99)
Triglycerides: 137 mg/dL (ref 0–149)
VLDL Cholesterol Cal: 24 mg/dL (ref 5–40)

## 2022-07-09 LAB — COMPREHENSIVE METABOLIC PANEL
ALT: 23 IU/L (ref 0–32)
AST: 23 IU/L (ref 0–40)
Albumin: 4.4 g/dL (ref 3.9–4.9)
Alkaline Phosphatase: 80 IU/L (ref 44–121)
BUN/Creatinine Ratio: 27 (ref 12–28)
BUN: 27 mg/dL (ref 8–27)
Bilirubin Total: 0.4 mg/dL (ref 0.0–1.2)
CO2: 27 mmol/L (ref 20–29)
Calcium: 10.1 mg/dL (ref 8.7–10.3)
Chloride: 98 mmol/L (ref 96–106)
Creatinine, Ser: 0.99 mg/dL (ref 0.57–1.00)
Globulin, Total: 2.4 g/dL (ref 1.5–4.5)
Glucose: 103 mg/dL — ABNORMAL HIGH (ref 70–99)
Potassium: 4.3 mmol/L (ref 3.5–5.2)
Sodium: 139 mmol/L (ref 134–144)
Total Protein: 6.8 g/dL (ref 6.0–8.5)
eGFR: 62 mL/min/{1.73_m2} (ref >59)

## 2022-07-09 LAB — CBC WITH DIFFERENTIAL/PLATELET
Basophils Absolute: 0.1 10*3/uL (ref 0.0–0.2)
Basos: 1 %
EOS (ABSOLUTE): 0.3 10*3/uL (ref 0.0–0.4)
Eos: 4 %
Hematocrit: 42 % (ref 34.0–46.6)
Hemoglobin: 13.9 g/dL (ref 11.1–15.9)
Immature Grans (Abs): 0 10*3/uL (ref 0.0–0.1)
Immature Granulocytes: 0 %
Lymphocytes Absolute: 2.5 10*3/uL (ref 0.7–3.1)
Lymphs: 35 %
MCH: 29 pg (ref 26.6–33.0)
MCHC: 33.1 g/dL (ref 31.5–35.7)
MCV: 88 fL (ref 79–97)
Monocytes Absolute: 0.6 10*3/uL (ref 0.1–0.9)
Monocytes: 9 %
Neutrophils Absolute: 3.7 10*3/uL (ref 1.4–7.0)
Neutrophils: 51 %
Platelets: 291 10*3/uL (ref 150–450)
RBC: 4.8 x10E6/uL (ref 3.77–5.28)
RDW: 13 % (ref 11.7–15.4)
WBC: 7.2 10*3/uL (ref 3.4–10.8)

## 2022-07-09 LAB — HEMOGLOBIN A1C
Est. average glucose Bld gHb Est-mCnc: 128 mg/dL
Hgb A1c MFr Bld: 6.1 % — ABNORMAL HIGH (ref 4.8–5.6)

## 2022-07-10 ENCOUNTER — Other Ambulatory Visit: Payer: Self-pay | Admitting: Family Medicine

## 2022-07-10 DIAGNOSIS — I152 Hypertension secondary to endocrine disorders: Secondary | ICD-10-CM

## 2022-07-13 NOTE — Assessment & Plan Note (Signed)
Well controlled.  No changes to medicines. Rosuvastatin 5 mg daily. Continue to work on eating a healthy diet and exercise.   

## 2022-07-13 NOTE — Assessment & Plan Note (Signed)
The current medical regimen is effective;  continue present plan and medications.  

## 2022-07-13 NOTE — Assessment & Plan Note (Addendum)
Control: good Recommend check feet daily. Recommend annual eye exams. Medicines: Continue victoza 1.8 mg daily. Continue to work on eating a healthy diet and exercise.  Labs drawn today.

## 2022-07-13 NOTE — Progress Notes (Signed)
Subjective:  Patient ID: Anna Greene, female    DOB: 08-Apr-1955  Age: 67 y.o. MRN: [id number]  Chief Complaint  Patient presents with   Medical Management of Chronic Issues    HPI Labs obtained prior to appointment and were good.  Blood count normal. Liver function normal. Kidney function normal. Cholesterol: GREAT!  Dropped your triglycerides in goal. HBA1C: 6.1.  Diabetes:  Complications: Hyperlipidemia, hypertension Glucose checking: No Glucose logs: No Hypoglycemia: She is not sure Most recent A1C: 6.1% Current medications: Victoza 1.8 mg daily. Last Eye Exam: 10/29/2021 Foot checks: Daily Eats healthy and tries to exercise.  Hyperlipidemia: Current medications: Rosuvastatin 5 mg daily.  Hypertension: Complications: Diabetes Current medications: Atenolol-chlorthalidone 100-25 mg daily, Losartan 50 mg daily.   GAD: Taking klonopin 0.5 mg daily PRN.   GERD: Taking Omeprazole 20 mg every morning.  Insomnia: Patient is on clonazepam 0.5 mg nightly.   Back pain/Spinal stenosis/DDD-had steroid injection around 03/05/22 and 5/24 with Dr. Regino Schultze. Injections help for a little while.  Last mammogram 04/2022     07/15/2022   10:32 AM 02/04/2022   10:39 AM 10/01/2021    1:32 PM 05/28/2021    2:31 PM 05/14/2021    8:23 AM  Depression screen PHQ 2/9  Decreased Interest 0 0 0 0 0  Down, Depressed, Hopeless 0 0 0 0 0  PHQ - 2 Score 0 0 0 0 0  Altered sleeping 0  0 2 2  Tired, decreased energy 0  0 0 0  Change in appetite 0  0 0 0  Feeling bad or failure about yourself  0  0 0 0  Trouble concentrating 0  0 0 0  Moving slowly or fidgety/restless 0  0 0 0  Suicidal thoughts 0  0 0 0  PHQ-9 Score 0  0 2 2  Difficult doing work/chores Not difficult at all  Not difficult at all          07/15/2022   10:31 AM  Fall Risk   Falls in the past year? 0  Number falls in past yr: 0  Injury with Fall? 0  Risk for fall due to : No Fall Risks  Follow up Falls evaluation completed     Patient Care Team: Blane Ohara, MD as PCP - General (Family Medicine) Zettie Pho, [facility] (Inactive) as Pharmacist (Pharmacist) Gean Birchwood, MD as Consulting Physician (Orthopedic Surgery) Marcelline Deist, MD as Consulting Physician (Ophthalmology) Marlowe Sax, RN as Case Manager (General Practice)   Review of Systems  Constitutional:  Negative for chills, fatigue and fever.  HENT:  Negative for congestion, ear pain, rhinorrhea and sore throat.   Respiratory:  Negative for cough and shortness of breath.   Cardiovascular:  Negative for chest pain.  Gastrointestinal:  Negative for abdominal pain, constipation, diarrhea, nausea and vomiting.  Genitourinary:  Negative for dysuria and urgency.  Musculoskeletal:  Positive for back pain. Negative for myalgias.  Neurological:  Negative for dizziness, weakness, light-headedness and headaches.  Psychiatric/Behavioral:  Negative for dysphoric mood. The patient is not nervous/anxious.     Current Outpatient Medications on File Prior to Visit  Medication Sig Dispense Refill   atenolol-chlorthalidone (TENORETIC) 100-25 MG tablet TAKE ONE TABLET BY MOUTH EVERY MORNING 90 tablet 0   celecoxib (CELEBREX) 200 MG capsule Take 1 capsule (200 mg total) by mouth 2 (two) times daily. 180 capsule 1   cetirizine (ZYRTEC) 10 MG tablet Take 1 tablet (10 mg total) by mouth daily. 90  tablet 3   clonazePAM (KLONOPIN) 1 MG tablet TAKE ONE TABLET BY MOUTH AT bedtime AS NEEDED FOR ANXIETY 30 tablet 2   Insulin Pen Needle 32G X 6 MM MISC 1 each by Does not apply route daily. Use with victoza samples 30 each 2   liraglutide (VICTOZA) 18 MG/3ML SOPN Inject 1.8 mg into the skin daily. PAP Approved 2023     losartan (COZAAR) 50 MG tablet Take 1 tablet (50 mg total) by mouth daily. 90 tablet 1   omega-3 acid ethyl esters (LOVAZA) 1 g capsule TAKE TWO CAPSULES BY MOUTH TWICE DAILY 180 capsule 0   omeprazole (PRILOSEC) 20 MG capsule Take 1 capsule (20 mg  total) by mouth every morning. 90 capsule 1   potassium chloride (KLOR-CON) 10 MEQ tablet Take 1 tablet (10 mEq total) by mouth every morning. 90 tablet 1   rosuvastatin (CRESTOR) 5 MG tablet TAKE ONE TABLET BY MOUTH EVERY EVENING 90 tablet 1   tiZANidine (ZANAFLEX) 4 MG tablet TAKE ONE TABLET BY MOUTH every EIGHT hours AS NEEDED FOR MUSCLE SPASMS 270 tablet 1   No current facility-administered medications on file prior to visit.   Past Medical History:  Diagnosis Date   Benign fasciculation-cramp syndrome    Depression    Diabetes mellitus without complication (HCC)    GERD (gastroesophageal reflux disease)    Hyperlipidemia    Hypertension    Nephrolithiasis    Osteoarthritis    Bilateral knees, degenerative disc disease.  Patient has had epidural steroid injections x2   Plantar fasciitis    Past Surgical History:  Procedure Laterality Date   BREAST REDUCTION SURGERY Bilateral 1996   CESAREAN SECTION  1976   CESAREAN SECTION  1979   KNEE ARTHROSCOPY W/ MENISCAL REPAIR Left 1995   Per patient muscle was repaired   KNEE ARTHROSCOPY W/ MENISCAL REPAIR Right 2003   knee surgery Left 10/2020   meniscal repair via arthroscopy   PANNICULECTOMY     REDUCTION MAMMAPLASTY  1996   REPLACEMENT TOTAL KNEE Right 2019   TONSILLECTOMY  1960    Family History  Problem Relation Age of Onset   Stroke Mother    Heart disease Mother    Cancer Father    Diabetes Sister    Anxiety disorder Sister    Depression Sister    Diabetes Sister    Diabetes Sister    Arthritis Sister    Stroke Brother    Breast cancer Neg Hx    Social History   Socioeconomic History   Marital status: Married    Spouse name: Ronnie   Number of children: 2   Years of education: Not on file   Highest education level: 12th grade  Occupational History   Occupation: TEFL teacher   Occupation: Retired Teacher, music  Tobacco Use   Smoking status: Never   Smokeless tobacco: Never  Building services engineer Use:  Never used  Substance and Sexual Activity   Alcohol use: Never   Drug use: Never   Sexual activity: Yes    Partners: Male  Other Topics Concern   Not on file  Social History Narrative   Patient is married.  Has 2 adult children that live in [place] (where she is from)   Social Determinants of Health   Financial Resource Strain: Low Risk  (07/11/2022)   Overall Financial Resource Strain (CARDIA)    Difficulty of Paying Living Expenses: Not hard at all  Food Insecurity: No Food Insecurity (07/11/2022)  Hunger Vital Sign    Worried About Running Out of Food in the Last Year: Never true    Ran Out of Food in the Last Year: Never true  Transportation Needs: No Transportation Needs (07/11/2022)   PRAPARE - Administrator, Civil Service (Medical): No    Lack of Transportation (Non-Medical): No  Physical Activity: Inactive (07/15/2022)   Exercise Vital Sign    Days of Exercise per Week: 0 days    Minutes of Exercise per Session: 0 min  Stress: No Stress Concern Present (07/11/2022)   [employer] of Occupational Health - Occupational Stress Questionnaire    Feeling of Stress : Not at all  Social Connections: Moderately Isolated (07/11/2022)   Social Connection and Isolation Panel [NHANES]    Frequency of Communication with Friends and Family: Three times a week    Frequency of Social Gatherings with Friends and Family: Patient declined    Attends Religious Services: Never    Diplomatic Services operational officer: No    Attends Engineer, structural: Never    Marital Status: Married    Objective:  BP 132/74   Pulse 86   Temp 97.6 F (36.4 C)   Ht 5\' 6"  (1.676 m)   Wt 206 lb (93.4 kg)   LMP  (LMP Unknown)   SpO2 100%   BMI 33.25 kg/m      07/15/2022   10:24 AM 02/14/2022    2:54 PM 02/04/2022   10:34 AM  BP/Weight  Systolic BP 132 132 128  Diastolic BP 74 70 78  Wt. (Lbs) 206 209 211  BMI 33.25 kg/m2 33.73 kg/m2 34.06 kg/m2    Physical  Exam Vitals reviewed.  Constitutional:      Appearance: Normal appearance. She is obese.  Neck:     Vascular: No carotid bruit.  Cardiovascular:     Rate and Rhythm: Normal rate and regular rhythm.     Heart sounds: Normal heart sounds.  Pulmonary:     Effort: Pulmonary effort is normal. No respiratory distress.     Breath sounds: Normal breath sounds.  Abdominal:     General: Abdomen is flat. Bowel sounds are normal.     Palpations: Abdomen is soft.     Tenderness: There is no abdominal tenderness.  Neurological:     Mental Status: She is alert and oriented to person, place, and time.  Psychiatric:        Mood and Affect: Mood normal.        Behavior: Behavior normal.     Diabetic Foot Exam - Simple   Simple Foot Form Diabetic Foot exam was performed with the following findings: Yes 07/15/2022  1:01 PM  Visual Inspection No deformities, no ulcerations, no other skin breakdown bilaterally: Yes Sensation Testing Intact to touch and monofilament testing bilaterally: Yes Pulse Check Posterior Tibialis and Dorsalis pulse intact bilaterally: Yes Comments      Lab Results  Component Value Date   WBC 7.2 07/08/2022   HGB 13.9 07/08/2022   HCT 42.0 07/08/2022   PLT 291 07/08/2022   GLUCOSE 103 (H) 07/08/2022   CHOL 149 07/08/2022   TRIG 137 07/08/2022   HDL 58 07/08/2022   LDLCALC 67 07/08/2022   ALT 23 07/08/2022   AST 23 07/08/2022   NA 139 07/08/2022   K 4.3 07/08/2022   CL 98 07/08/2022   CREATININE 0.99 07/08/2022   BUN 27 07/08/2022   CO2 27 07/08/2022   TSH 0.948  01/28/2022   HGBA1C 6.1 (H) 07/08/2022      Assessment & Plan:    Hypertension associated with type 2 diabetes mellitus (HCC) Assessment & Plan: Well controlled.  No changes to medicines. Atenolol-chlorthalidone 100-25 mg daily, Losartan 50 mg daily.  Continue to work on eating a healthy diet and exercise.    Orders: -     EKG 12-Lead  Gastroesophageal reflux disease without  esophagitis Assessment & Plan: The current medical regimen is effective;  continue present plan and medications.    Combined hyperlipidemia associated with type 2 diabetes mellitus (HCC) Assessment & Plan: Control: good Recommend check feet daily. Recommend annual eye exams. Medicines: Continue victoza 1.8 mg daily. Continue to work on eating a healthy diet and exercise.  Labs drawn today.      Mixed hyperlipidemia Assessment & Plan: Well controlled.  No changes to medicines. Rosuvastatin 5 mg daily.  Continue to work on eating a healthy diet and exercise.     Encounter for osteoporosis screening in asymptomatic postmenopausal patient -     DG Bone Density; Future  Onychomycosis Assessment & Plan: D/C terbenafine   Degenerative disc disease, lumbar Assessment & Plan: Management per specialist.     Palpitations -     LONG TERM MONITOR (3-14 DAYS); Future  Degeneration of lumbar intervertebral disc Assessment & Plan: Defer management to orthopedics/pain management.       No orders of the defined types were placed in this encounter.   Orders Placed This Encounter  Procedures   DG Bone Density   LONG TERM MONITOR (3-14 DAYS)   EKG 12-Lead     Follow-up: Return in about 3 months (around 10/15/2022) for chronic, fasting.   I,Marla I Leal-Borjas,acting as a scribe for Blane Ohara, MD.,have documented all relevant documentation on the behalf of Blane Ohara, MD,as directed by  Blane Ohara, MD while in the presence of Blane Ohara, MD.   An After Visit Summary was printed and given to the patient.  Blane Ohara, MD [facility] [phone]

## 2022-07-13 NOTE — Assessment & Plan Note (Signed)
Well controlled.  No changes to medicines. Atenolol-chlorthalidone 100-25 mg daily, Losartan 50 mg daily.  Continue to work on eating a healthy diet and exercise.   

## 2022-07-14 DIAGNOSIS — I10 Essential (primary) hypertension: Secondary | ICD-10-CM

## 2022-07-14 DIAGNOSIS — E1159 Type 2 diabetes mellitus with other circulatory complications: Secondary | ICD-10-CM

## 2022-07-14 DIAGNOSIS — Z794 Long term (current) use of insulin: Secondary | ICD-10-CM | POA: Diagnosis not present

## 2022-07-15 ENCOUNTER — Ambulatory Visit (INDEPENDENT_AMBULATORY_CARE_PROVIDER_SITE_OTHER): Payer: Medicare HMO | Admitting: Family Medicine

## 2022-07-15 ENCOUNTER — Encounter: Payer: Self-pay | Admitting: Family Medicine

## 2022-07-15 VITALS — BP 132/74 | HR 86 | Temp 97.6°F | Ht 66.0 in | Wt 206.0 lb

## 2022-07-15 DIAGNOSIS — E1169 Type 2 diabetes mellitus with other specified complication: Secondary | ICD-10-CM | POA: Diagnosis not present

## 2022-07-15 DIAGNOSIS — K219 Gastro-esophageal reflux disease without esophagitis: Secondary | ICD-10-CM

## 2022-07-15 DIAGNOSIS — E782 Mixed hyperlipidemia: Secondary | ICD-10-CM

## 2022-07-15 DIAGNOSIS — M5136 Other intervertebral disc degeneration, lumbar region: Secondary | ICD-10-CM

## 2022-07-15 DIAGNOSIS — Z780 Asymptomatic menopausal state: Secondary | ICD-10-CM | POA: Diagnosis not present

## 2022-07-15 DIAGNOSIS — E1159 Type 2 diabetes mellitus with other circulatory complications: Secondary | ICD-10-CM | POA: Diagnosis not present

## 2022-07-15 DIAGNOSIS — I152 Hypertension secondary to endocrine disorders: Secondary | ICD-10-CM

## 2022-07-15 DIAGNOSIS — B351 Tinea unguium: Secondary | ICD-10-CM | POA: Diagnosis not present

## 2022-07-15 DIAGNOSIS — R002 Palpitations: Secondary | ICD-10-CM

## 2022-07-15 DIAGNOSIS — Z13820 Encounter for screening for osteoporosis: Secondary | ICD-10-CM

## 2022-07-15 NOTE — Assessment & Plan Note (Signed)
Management per specialist. 

## 2022-07-15 NOTE — Assessment & Plan Note (Signed)
Defer management to orthopedics/pain management.

## 2022-07-15 NOTE — Assessment & Plan Note (Signed)
D/C terbenafine

## 2022-07-16 ENCOUNTER — Ambulatory Visit: Payer: Medicare HMO | Attending: Family Medicine

## 2022-07-16 DIAGNOSIS — R002 Palpitations: Secondary | ICD-10-CM

## 2022-07-16 NOTE — Progress Notes (Unsigned)
Enrolled for [employer] to mail a ZIO XT long term holter monitor to the patients address on file.   DOD to read. 

## 2022-07-19 DIAGNOSIS — R002 Palpitations: Secondary | ICD-10-CM

## 2022-07-25 ENCOUNTER — Telehealth: Payer: Self-pay | Admitting: Family Medicine

## 2022-08-01 ENCOUNTER — Other Ambulatory Visit: Payer: Self-pay | Admitting: Family Medicine

## 2022-08-01 DIAGNOSIS — F411 Generalized anxiety disorder: Secondary | ICD-10-CM

## 2022-08-07 DIAGNOSIS — R002 Palpitations: Secondary | ICD-10-CM | POA: Diagnosis not present

## 2022-08-08 ENCOUNTER — Other Ambulatory Visit: Payer: Self-pay | Admitting: Family Medicine

## 2022-08-16 DIAGNOSIS — Z780 Asymptomatic menopausal state: Secondary | ICD-10-CM | POA: Diagnosis not present

## 2022-08-16 DIAGNOSIS — N959 Unspecified menopausal and perimenopausal disorder: Secondary | ICD-10-CM | POA: Diagnosis not present

## 2022-08-19 ENCOUNTER — Other Ambulatory Visit: Payer: Medicare HMO

## 2022-08-19 ENCOUNTER — Telehealth: Payer: Medicare HMO

## 2022-08-19 ENCOUNTER — Encounter: Payer: Self-pay | Admitting: Family Medicine

## 2022-08-19 ENCOUNTER — Other Ambulatory Visit: Payer: Self-pay

## 2022-08-19 NOTE — Patient Outreach (Signed)
Care Management   Visit Note  08/19/2022 Name: Anna Greene MRN: [id number] DOB: 05-08-1955  Subjective: Anna Greene is a 67 y.o. year old female who is a primary care patient of Cox, Kirsten, MD. The Care Management team was consulted for assistance.      Engaged with patient spoke with patient by telephone.    Goals Addressed             This Visit's Progress    CCM Expected Outcome:  Monitor, Self-Manage, and Reduce Symptoms of Hypertension       Current Barriers:  Chronic Disease Management support and education needs related to effective management of HTN Financial Constraints.  BP Readings from Last 3 Encounters:  07/15/22 132/74  02/14/22 132/70  02/04/22 128/78     Planned Interventions: Evaluation of current treatment plan related to hypertension self management and patient's adherence to plan as established by provider. The patient states her blood pressures have been stable. The patient states she feels good and denies any acute changes in her HTN or heart health. 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  Provided education to patient re: stroke prevention, s/s of heart attack and stroke; Reviewed prescribed diet heart healthy/ADA diet. Review and education. The patient is compliant with dietary restrictions Reviewed medications with patient and discussed importance of compliance. Patient is compliant with medications. Denies any medication needs related to her HTN and heart health. Has an appointment to follow up with the pharm D in September for pap assisance  Discussed plans with patient for ongoing care management follow up and provided patient with direct contact information for care management team; Reviewed scheduled/upcoming provider appointments including: 10-21-2022 at 11 am. 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  New eye exam for 10-14-2022 am   Symptom Management: Take medications as prescribed   Attend all scheduled provider appointments Call pharmacy for medication refills 3-7 days in advance of running out of medications Call provider office for new concerns or questions  call the Suicide and Crisis Lifeline: 988 call the [place] National Suicide Prevention Lifeline: (657)754-4241 or TTY: 514-211-8131 TTY 586-093-9858) to talk to a trained counselor call 1-800-273-TALK (toll free, 24 hour hotline) if experiencing a Mental Health or Behavioral Health Crisis  learn about high blood pressure call doctor for signs and symptoms of high blood pressure develop an action plan for high blood pressure keep all doctor appointments take medications for blood pressure exactly as prescribed report new symptoms to your doctor  Follow Up Plan: Telephone follow up appointment with care management team member scheduled for: 11-11-2022 at 0945 am       RNCM Care Management Expected Outcome:  Monitor, Self-Manage and Reduce Symptoms of Diabetes       Current Barriers:  Care Coordination needs related to cost of Victoza and current pap in a patient with DM Chronic Disease Management support and education needs related to effective management of DM Financial Constraints.  Lab Results  Component Value Date   HGBA1C 6.1 (H) 07/08/2022     Planned Interventions: Provided education to patient about basic DM disease process. The patient  has good control of her DM. Her A1C is slightly elevated from 5.9% to 6.1% on 01-28-2022, Remains at 6.1% on 07-08-2022.  Denies any new concerns related to DM. Feels like she is doing well. Saw the pcp in July and no changes in the plan of care; Reviewed medications with patient and discussed importance of medication adherence. The patient will talk with pharm D for Victoza PAP assistance in September. Review of the pharm D changes and the patient is aware. The patient now has her Victoza. Paperwork for Victoza. States that she is staying on top of it. She denies any acute changes in her medications at this time. Knows to call for changes.  Reviewed prescribed diet with patient heart healthy/ADA diet. Review and education given ; Counseled on importance of regular laboratory monitoring as prescribed. Has labs on a regular basis.  Discussed plans with patient for ongoing care management follow up and provided patient with direct contact information for care management team;      Provided patient with written educational materials related to hypo and hyperglycemia and importance of correct treatment. Denies any sx and sx of hypo or hyperglycemias;       Reviewed scheduled/upcoming provider appointments including: 10-21-2022;         Advised patient, providing education and rationale, to check cbg when you have symptoms of low or high blood sugar and record. The patient does not check her blood sugars daily. She knows when her blood sugars are out of range.  The only time it gets out of range is if she eats cake or something like that. She states she does not let it get like that often.         call provider for findings outside established parameters;       Referral made to pharmacy team for assistance with medication questions and assistance with PAP for Victoza. Is on pill packaging system. New appointment set up with the pharm D for assistance with paperwork needs due for November 2024  Review of patient  status, including review of consultants reports, relevant laboratory and other test results, and medications completed;       Advised patient to discuss changes in DM or chronic conditions with provider;      Screening for signs and symptoms of depression related to chronic disease state;        Assessed social determinant of health barriers;         Symptom Management: Take medications as prescribed   Attend all scheduled provider appointments Call pharmacy for medication refills 3-7 days in advance of running out of medications Call provider office for new concerns or questions  call the Suicide and Crisis Lifeline: 988 call the [place] National Suicide Prevention Lifeline: 515-394-1776 or TTY: (469)033-4198 TTY 670-088-8200) to talk to a trained counselor call 1-800-273-TALK (toll free, 24 hour hotline) if experiencing a Mental Health or Behavioral Health Crisis  check blood sugar at prescribed times: when you have symptoms of low or high blood sugar check feet daily for cuts, sores or redness trim toenails straight across wash and dry feet carefully every day wear comfortable, cotton socks wear comfortable, well-fitting shoes  Follow Up Plan: Telephone follow up appointment with care management team member scheduled for: 11-11-2022 at 0945 am  Consent to Services:  Patient was given information about care management services, agreed to services, and gave verbal consent to participate.   Plan: Telephone follow up appointment with care management team member scheduled for: 11-11-2022 at 0945 am  Alto Denver RN, MSN, CCM RN Care Manager  [facility]  Ambulatory Care Management  Direct Number: [phone]

## 2022-08-19 NOTE — Patient Instructions (Signed)
Visit Information  Thank you for taking time to visit with me today. Please don't hesitate to contact me if I can be of assistance to you before our next scheduled telephone appointment.  Following are the goals we discussed today:   Goals Addressed             This Visit's Progress    CCM Expected Outcome:  Monitor, Self-Manage, and Reduce Symptoms of Hypertension       Current Barriers:  Chronic Disease Management support and education needs related to effective management of HTN Financial Constraints.  BP Readings from Last 3 Encounters:  07/15/22 132/74  02/14/22 132/70  02/04/22 128/78     Planned Interventions: Evaluation of current treatment plan related to hypertension self management and patient's adherence to plan as established by provider. The patient states her blood pressures have been stable. The patient states she feels good and denies any acute changes in her HTN or heart health. 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  Provided education to patient re: stroke prevention, s/s of heart attack and stroke; Reviewed prescribed diet heart healthy/ADA diet. Review and education. The patient is compliant with dietary restrictions Reviewed medications with patient and discussed importance of compliance. Patient is compliant with medications. Denies any medication needs related to her HTN and heart health. Has an appointment to follow up with the pharm D in September for pap assisance  Discussed plans with patient for ongoing care management follow up and provided patient with direct contact information for care management team; Reviewed scheduled/upcoming provider appointments including: 10-21-2022 at 11 am. 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  New eye exam for 10-14-2022 am   Symptom Management: Take medications as prescribed   Attend all scheduled provider appointments Call pharmacy for medication refills 3-7 days in advance of running out of medications Call provider office for new concerns or questions  call the Suicide and Crisis Lifeline: 988 call the [place] National Suicide Prevention Lifeline: (830)267-4362 or TTY: 6156505422 TTY 602-482-2227) to talk to a trained counselor call 1-800-273-TALK (toll free, 24 hour hotline) if experiencing a Mental Health or Behavioral Health Crisis  learn about high blood pressure call doctor for signs and symptoms of high blood pressure develop an action plan for high blood pressure keep all doctor appointments take medications for blood pressure exactly as prescribed report new symptoms to your doctor  Follow Up Plan: Telephone follow up appointment with care management team member scheduled for: 11-11-2022 at 0945 am       RNCM Care Management Expected Outcome:  Monitor, Self-Manage and Reduce Symptoms of Diabetes       Current Barriers:  Care Coordination needs related to cost of Victoza and current pap in a patient with DM Chronic Disease Management support and education needs related to effective management of DM Financial Constraints.  Lab Results  Component Value Date   HGBA1C 6.1 (H) 07/08/2022  Planned Interventions: Provided education to patient about basic DM disease process. The patient has good control of her DM. Her A1C is slightly elevated from 5.9% to 6.1% on  01-28-2022, Remains at 6.1% on 07-08-2022.  Denies any new concerns related to DM. Feels like she is doing well. Saw the pcp in July and no changes in the plan of care; Reviewed medications with patient and discussed importance of medication adherence. The patient will talk with pharm D for Victoza PAP assistance in September. Review of the pharm D changes and the patient is aware. The patient now has her Victoza. Paperwork for Victoza. States that she is staying on top of it. She denies any acute changes in her medications at this time. Knows to call for changes.  Reviewed prescribed diet with patient heart healthy/ADA diet. Review and education given ; Counseled on importance of regular laboratory monitoring as prescribed. Has labs on a regular basis.  Discussed plans with patient for ongoing care management follow up and provided patient with direct contact information for care management team;      Provided patient with written educational materials related to hypo and hyperglycemia and importance of correct treatment. Denies any sx and sx of hypo or hyperglycemias;       Reviewed scheduled/upcoming provider appointments including: 10-21-2022;         Advised patient, providing education and rationale, to check cbg when you have symptoms of low or high blood sugar and record. The patient does not check her blood sugars daily. She knows when her blood sugars are out of range.  The only time it gets out of range is if she eats cake or something like that. She states she does not let it get like that often.         call provider for findings outside established parameters;       Referral made to pharmacy team for assistance with medication questions and assistance with PAP for Victoza. Is on pill packaging system. New appointment set up with the pharm D for assistance with paperwork needs due for November 2024  Review of patient status, including review of consultants reports, relevant laboratory and other  test results, and medications completed;       Advised patient to discuss changes in DM or chronic conditions with provider;      Screening for signs and symptoms of depression related to chronic disease state;        Assessed social determinant of health barriers;         Symptom Management: Take medications as prescribed   Attend all scheduled provider appointments Call pharmacy for medication refills 3-7 days in advance of running out of medications Call provider office for new concerns or questions  call the Suicide and Crisis Lifeline: 988 call the [place] National Suicide Prevention Lifeline: 458-794-9712 or TTY: 859-257-8924 TTY 307-248-3470) to talk to a trained counselor call 1-800-273-TALK (toll free, 24 hour hotline) if experiencing a Mental Health or Behavioral Health Crisis  check blood sugar at prescribed times: when you have symptoms of low or high blood sugar check feet daily for cuts, sores or redness trim toenails straight across wash and dry feet carefully every day wear comfortable, cotton socks wear comfortable, well-fitting shoes  Follow Up Plan: Telephone follow up appointment with care management team member scheduled for: 11-11-2022 at 0945 am           Our next appointment is by telephone on 11-11-2022 at 0945 am  Please  call the care guide team at [phone] if you need to cancel or reschedule your appointment.   If you are experiencing a Mental Health or Behavioral Health Crisis or need someone to talk to, please call the Suicide and Crisis Lifeline: 988 call the [place] National Suicide Prevention Lifeline: 580 463 2245 or TTY: (313)182-4832 TTY (620) 204-4160) to talk to a trained counselor call 1-800-273-TALK (toll free, 24 hour hotline) go to [facility] [address] [place] [phone])   Patient verbalizes understanding of instructions and care plan provided today and agrees to view in MyChart. Active  MyChart status and patient understanding of how to access instructions and care plan via MyChart confirmed with patient.       Alto Denver RN, MSN, CCM RN Care Manager  [facility]  Ambulatory Care Management  Direct Number: [phone]

## 2022-08-30 ENCOUNTER — Telehealth: Payer: Self-pay

## 2022-08-30 NOTE — Telephone Encounter (Signed)
Patient made aware Victoza in office for pick up, Stated she will pick Tuesday.

## 2022-09-01 ENCOUNTER — Other Ambulatory Visit: Payer: Self-pay | Admitting: Family Medicine

## 2022-09-01 DIAGNOSIS — E1159 Type 2 diabetes mellitus with other circulatory complications: Secondary | ICD-10-CM

## 2022-09-01 DIAGNOSIS — K219 Gastro-esophageal reflux disease without esophagitis: Secondary | ICD-10-CM

## 2022-09-03 NOTE — Telephone Encounter (Signed)
PT PICKED UP PA VICTOZA PEN SIGNED BLUE FORM LEFT IN Creola Corn, LPN BOX

## 2022-09-20 ENCOUNTER — Other Ambulatory Visit: Payer: Self-pay | Admitting: Family Medicine

## 2022-09-20 DIAGNOSIS — E782 Mixed hyperlipidemia: Secondary | ICD-10-CM

## 2022-09-20 DIAGNOSIS — I152 Hypertension secondary to endocrine disorders: Secondary | ICD-10-CM

## 2022-09-23 ENCOUNTER — Other Ambulatory Visit: Payer: Self-pay

## 2022-09-23 DIAGNOSIS — F411 Generalized anxiety disorder: Secondary | ICD-10-CM

## 2022-09-23 DIAGNOSIS — M5136 Other intervertebral disc degeneration, lumbar region: Secondary | ICD-10-CM

## 2022-09-23 DIAGNOSIS — E1159 Type 2 diabetes mellitus with other circulatory complications: Secondary | ICD-10-CM

## 2022-09-23 MED ORDER — CELECOXIB 200 MG PO CAPS
200.0000 mg | ORAL_CAPSULE | Freq: Two times a day (BID) | ORAL | 1 refills | Status: DC
Start: 2022-09-23 — End: 2022-10-04

## 2022-09-23 MED ORDER — CLONAZEPAM 1 MG PO TABS
ORAL_TABLET | ORAL | 2 refills | Status: DC
Start: 2022-09-23 — End: 2022-10-04

## 2022-09-23 MED ORDER — LOSARTAN POTASSIUM 50 MG PO TABS: 50.0000 mg | ORAL_TABLET | Freq: Every day | ORAL | 1 refills | Status: DC

## 2022-10-04 ENCOUNTER — Other Ambulatory Visit: Payer: Self-pay | Admitting: Family Medicine

## 2022-10-04 ENCOUNTER — Telehealth: Payer: Self-pay

## 2022-10-04 ENCOUNTER — Other Ambulatory Visit: Payer: Self-pay

## 2022-10-04 DIAGNOSIS — K219 Gastro-esophageal reflux disease without esophagitis: Secondary | ICD-10-CM

## 2022-10-04 DIAGNOSIS — E782 Mixed hyperlipidemia: Secondary | ICD-10-CM

## 2022-10-04 DIAGNOSIS — E1159 Type 2 diabetes mellitus with other circulatory complications: Secondary | ICD-10-CM

## 2022-10-04 DIAGNOSIS — M5136 Other intervertebral disc degeneration, lumbar region: Secondary | ICD-10-CM

## 2022-10-04 DIAGNOSIS — F411 Generalized anxiety disorder: Secondary | ICD-10-CM

## 2022-10-04 MED ORDER — OMEPRAZOLE 20 MG PO CPDR: 20.0000 mg | DELAYED_RELEASE_CAPSULE | Freq: Every morning | ORAL | 0 refills | Status: DC

## 2022-10-04 MED ORDER — POTASSIUM CHLORIDE ER 10 MEQ PO TBCR: 10.0000 meq | EXTENDED_RELEASE_TABLET | Freq: Every morning | ORAL | 0 refills | Status: DC

## 2022-10-04 MED ORDER — ATENOLOL-CHLORTHALIDONE 100-25 MG PO TABS: 1.0000 | ORAL_TABLET | Freq: Every day | ORAL | 0 refills | Status: DC

## 2022-10-04 MED ORDER — LOSARTAN POTASSIUM 50 MG PO TABS: 50.0000 mg | ORAL_TABLET | Freq: Every day | ORAL | 1 refills | Status: DC

## 2022-10-04 MED ORDER — OMEPRAZOLE 20 MG PO CPDR: 20.0000 mg | DELAYED_RELEASE_CAPSULE | Freq: Every morning | ORAL | 1 refills | Status: DC

## 2022-10-04 MED ORDER — TIZANIDINE HCL 4 MG PO TABS: 4.0000 mg | ORAL_TABLET | Freq: Three times a day (TID) | ORAL | 1 refills | Status: DC | PRN

## 2022-10-04 MED ORDER — CELECOXIB 200 MG PO CAPS: 200.0000 mg | ORAL_CAPSULE | Freq: Two times a day (BID) | ORAL | 0 refills | Status: DC

## 2022-10-04 MED ORDER — LIRAGLUTIDE 18 MG/3ML ~~LOC~~ SOPN: 1.8000 mg | PEN_INJECTOR | Freq: Every day | SUBCUTANEOUS | 1 refills | Status: DC

## 2022-10-04 MED ORDER — OMEGA-3-ACID ETHYL ESTERS 1 G PO CAPS: 2.0000 | ORAL_CAPSULE | Freq: Two times a day (BID) | ORAL | 0 refills | Status: DC

## 2022-10-04 MED ORDER — LOSARTAN POTASSIUM 50 MG PO TABS: 50.0000 mg | ORAL_TABLET | Freq: Every day | ORAL | 0 refills | Status: DC

## 2022-10-04 MED ORDER — POTASSIUM CHLORIDE ER 10 MEQ PO TBCR: 10.0000 meq | EXTENDED_RELEASE_TABLET | Freq: Every morning | ORAL | 1 refills | Status: DC

## 2022-10-04 MED ORDER — ROSUVASTATIN CALCIUM 5 MG PO TABS: 5.0000 mg | ORAL_TABLET | Freq: Every day | ORAL | 1 refills | Status: DC

## 2022-10-04 MED ORDER — ROSUVASTATIN CALCIUM 5 MG PO TABS
5.0000 mg | ORAL_TABLET | Freq: Every day | ORAL | 0 refills | Status: DC
Start: 2022-10-04 — End: 2022-10-04

## 2022-10-04 NOTE — Telephone Encounter (Signed)
Mrs. Pavlicek called and is having issues with her medications.  She formally had [facility].  They have trasferred her medication to a mail order pharmacy.  She is going to be out of her medication and she is considering having [place] transfer them to [place].  She is going to check with the insurance and call us back.

## 2022-10-07 MED ORDER — CLONAZEPAM 1 MG PO TABS
ORAL_TABLET | ORAL | 2 refills | Status: DC
Start: 2022-10-07 — End: 2023-04-17

## 2022-10-13 ENCOUNTER — Other Ambulatory Visit: Payer: Self-pay

## 2022-10-13 DIAGNOSIS — I152 Hypertension secondary to endocrine disorders: Secondary | ICD-10-CM

## 2022-10-13 DIAGNOSIS — E1169 Type 2 diabetes mellitus with other specified complication: Secondary | ICD-10-CM

## 2022-10-13 DIAGNOSIS — E782 Mixed hyperlipidemia: Secondary | ICD-10-CM

## 2022-10-14 ENCOUNTER — Other Ambulatory Visit: Payer: Medicare HMO

## 2022-10-14 DIAGNOSIS — Z0100 Encounter for examination of eyes and vision without abnormal findings: Secondary | ICD-10-CM | POA: Diagnosis not present

## 2022-10-14 DIAGNOSIS — E1159 Type 2 diabetes mellitus with other circulatory complications: Secondary | ICD-10-CM | POA: Diagnosis not present

## 2022-10-14 DIAGNOSIS — E1169 Type 2 diabetes mellitus with other specified complication: Secondary | ICD-10-CM

## 2022-10-14 DIAGNOSIS — I152 Hypertension secondary to endocrine disorders: Secondary | ICD-10-CM

## 2022-10-14 DIAGNOSIS — H524 Presbyopia: Secondary | ICD-10-CM | POA: Diagnosis not present

## 2022-10-14 DIAGNOSIS — E119 Type 2 diabetes mellitus without complications: Secondary | ICD-10-CM | POA: Diagnosis not present

## 2022-10-14 DIAGNOSIS — E782 Mixed hyperlipidemia: Secondary | ICD-10-CM | POA: Diagnosis not present

## 2022-10-14 LAB — HM DIABETES EYE EXAM

## 2022-10-14 NOTE — Progress Notes (Signed)
10/14/2022  Patient ID: Anna Greene, female   DOB: [birth year], 67 y.o.   MRN: [id number]  S/O Telephone visit to assist with medication management  Medication Management -Patient previously established with [facility] for adherence packaging and home delivery -Medications have been sent to [facility] by provider(s) for continuity of care, and patient states insurance is covering claims at the pharmacy -Reviewed and verified current medication list accurate and up to date -Patient has [facility] PAP for Victoza 1.8mg   A/P  Medication Management -Discussed [facility] for adherence packaging and home delivery; patient and her husband select new insurance plan next month and will see if plan is contracted with pharmacy -Coordinating with medication assistance team to add patient's PAP to spreadsheet, so they can assist with re-enrollment for 2025  Follow-up:  Provided my direct phone number, so patient can reach out if assistance desired/needed to get medications transferred to [facility] in the future  Lenna Gilford, PharmD, DPLA

## 2022-10-15 LAB — LIPID PANEL
Chol/HDL Ratio: 2.9 {ratio} (ref 0.0–4.4)
Cholesterol, Total: 152 mg/dL (ref 100–199)
HDL: 52 mg/dL (ref >39)
LDL Chol Calc (NIH): 74 mg/dL (ref 0–99)
Triglycerides: 150 mg/dL — ABNORMAL HIGH (ref 0–149)
VLDL Cholesterol Cal: 26 mg/dL (ref 5–40)

## 2022-10-15 LAB — COMPREHENSIVE METABOLIC PANEL
ALT: 23 [IU]/L (ref 0–32)
AST: 25 [IU]/L (ref 0–40)
Albumin: 4.6 g/dL (ref 3.9–4.9)
Alkaline Phosphatase: 86 [IU]/L (ref 44–121)
BUN/Creatinine Ratio: 25 (ref 12–28)
BUN: 23 mg/dL (ref 8–27)
Bilirubin Total: 0.4 mg/dL (ref 0.0–1.2)
CO2: 25 mmol/L (ref 20–29)
Calcium: 10 mg/dL (ref 8.7–10.3)
Chloride: 98 mmol/L (ref 96–106)
Creatinine, Ser: 0.92 mg/dL (ref 0.57–1.00)
Globulin, Total: 2.3 g/dL (ref 1.5–4.5)
Glucose: 84 mg/dL (ref 70–99)
Potassium: 4.7 mmol/L (ref 3.5–5.2)
Sodium: 140 mmol/L (ref 134–144)
Total Protein: 6.9 g/dL (ref 6.0–8.5)
eGFR: 67 mL/min/{1.73_m2} (ref >59)

## 2022-10-15 LAB — CBC WITH DIFFERENTIAL/PLATELET
Basophils Absolute: 0.1 10*3/uL (ref 0.0–0.2)
Basos: 1 %
EOS (ABSOLUTE): 0.3 10*3/uL (ref 0.0–0.4)
Eos: 4 %
Hematocrit: 43.3 % (ref 34.0–46.6)
Hemoglobin: 13.7 g/dL (ref 11.1–15.9)
Immature Grans (Abs): 0 10*3/uL (ref 0.0–0.1)
Immature Granulocytes: 0 %
Lymphocytes Absolute: 2.6 10*3/uL (ref 0.7–3.1)
Lymphs: 39 %
MCH: 29 pg (ref 26.6–33.0)
MCHC: 31.6 g/dL (ref 31.5–35.7)
MCV: 92 fL (ref 79–97)
Monocytes Absolute: 0.7 10*3/uL (ref 0.1–0.9)
Monocytes: 11 %
Neutrophils Absolute: 3 10*3/uL (ref 1.4–7.0)
Neutrophils: 45 %
Platelets: 330 10*3/uL (ref 150–450)
RBC: 4.72 x10E6/uL (ref 3.77–5.28)
RDW: 13.2 % (ref 11.7–15.4)
WBC: 6.6 10*3/uL (ref 3.4–10.8)

## 2022-10-15 LAB — HEMOGLOBIN A1C
Est. average glucose Bld gHb Est-mCnc: 126 mg/dL
Hgb A1c MFr Bld: 6 % — ABNORMAL HIGH (ref 4.8–5.6)

## 2022-10-20 NOTE — Assessment & Plan Note (Addendum)
The current medical regimen is effective;  continue present plan and medications.  Omeprazole 20 mg once daily.

## 2022-10-20 NOTE — Assessment & Plan Note (Addendum)
Well controlled.  No changes to medicines. Atenolol-chlorthalidone 100-25 mg daily, Losartan 50 mg daily.  Continue to work on eating a healthy diet and exercise.  Labs drawn today.

## 2022-10-20 NOTE — Assessment & Plan Note (Signed)
Control: good Recommend check feet daily. Recommend annual eye exams. Medicines: Continue victoza 1.8 mg daily. Continue to work on eating a healthy diet and exercise.  Labs drawn today.

## 2022-10-20 NOTE — Assessment & Plan Note (Signed)
Well controlled.  No changes to medicines. Rosuvastatin 5 mg daily. Continue to work on eating a healthy diet and exercise.

## 2022-10-20 NOTE — Progress Notes (Unsigned)
Subjective:  Patient ID: Anna Greene, female    DOB: 08-Oct-1955  Age: 67 y.o. MRN: [id number]  Chief Complaint  Patient presents with   Medical Management of Chronic Issues    HPI Diabetes:  Complications: Hyperlipidemia, hypertension Glucose checking: No Glucose logs: No Hypoglycemia: She is not sure Most recent A1C: 6.0% Current medications: Victoza 1.8 mg daily. Last Eye Exam:  Foot checks: Daily Eats healthy and tries to exercise.  Hyperlipidemia: Current medications: Rosuvastatin 5 mg daily. AT GOAL  Hypertension: Complications: Diabetes Current medications: Atenolol-chlorthalidone 100-25 mg daily, Losartan 50 mg daily.   GAD: Taking klonopin 0.5 mg daily PRN.   GERD: Omeprazole 20 mg once daily.  Complaining of pain on right hip.  Usually gets methylprednisolone taper from orthopedics, but does not have an appt for 3-4 weeks. Also could not get refill of gabapentin from orthopedics due to appt not being until later in November. She has been on this for her spinal stenosis and it helps significantly. She will also be seeing Dr Regino Schultze to consider Esis. She is frustrated because her previous orthopedic doctor out of state would do three injections at once (not sure if some were facet injections.) I recommended she discuss this with Dr. Juanda Chance APP, who she is scheduled to see first.  She is also requesting amoxicillin for predental procedures coming up.       10/21/2022   10:51 AM 07/15/2022   10:32 AM 02/04/2022   10:39 AM 10/01/2021    1:32 PM 05/28/2021    2:31 PM  Depression screen PHQ 2/9  Decreased Interest 0 0 0 0 0  Down, Depressed, Hopeless 0 0 0 0 0  PHQ - 2 Score 0 0 0 0 0  Altered sleeping 1 0  0 2  Tired, decreased energy 0 0  0 0  Change in appetite 0 0  0 0  Feeling bad or failure about yourself  0 0  0 0  Trouble concentrating 0 0  0 0  Moving slowly or fidgety/restless 0 0  0 0  Suicidal thoughts 0 0  0 0  PHQ-9 Score 1 0  0 2  Difficult doing  work/chores Not difficult at all Not difficult at all  Not difficult at all         10/21/2022   10:51 AM  Fall Risk   Falls in the past year? 0  Number falls in past yr: 0  Injury with Fall? 0  Risk for fall due to : No Fall Risks  Follow up Falls evaluation completed;Falls prevention discussed    Patient Care Team: Blane Ohara, MD as PCP - General (Family Medicine) Gean Birchwood, MD as Consulting Physician (Orthopedic Surgery) Genia Del Daisy Blossom, MD as Consulting Physician (Ophthalmology) Marlowe Sax, RN as Case Manager (General Practice)   Review of Systems  Constitutional:  Negative for chills and fever.  HENT:  Positive for congestion, postnasal drip and rhinorrhea. Negative for ear pain.   Respiratory:  Positive for cough. Negative for shortness of breath.   Cardiovascular:  Negative for chest pain.  Musculoskeletal:  Positive for back pain (spinal stenosis. Restarted on gabapentin 300 mg tid).  Neurological:  Positive for headaches.  Psychiatric/Behavioral:  Negative for dysphoric mood. The patient is not nervous/anxious.     Current Outpatient Medications on File Prior to Visit  Medication Sig Dispense Refill   atenolol-chlorthalidone (TENORETIC) 100-25 MG tablet Take 1 tablet by mouth daily. 90 tablet 0   celecoxib (CELEBREX) 200  MG capsule Take 1 capsule (200 mg total) by mouth 2 (two) times daily. 180 capsule 0   cetirizine (ZYRTEC) 10 MG tablet Take 1 tablet (10 mg total) by mouth daily. 90 tablet 3   clonazePAM (KLONOPIN) 1 MG tablet TAKE ONE TABLET BY MOUTH AT bedtime AS NEEDED FOR ANXIETY 30 tablet 2   EASY TOUCH PEN NEEDLES 32G X 6 MM MISC USE TO INEJCT VICTOZA DAILY AS DIRECTED *REFILL REQUEST* 100 each 10   losartan (COZAAR) 50 MG tablet Take 1 tablet (50 mg total) by mouth daily. 90 tablet 1   omega-3 acid ethyl esters (LOVAZA) 1 g capsule Take 2 capsules (2 g total) by mouth 2 (two) times daily. 360 capsule 0   omeprazole (PRILOSEC) 20 MG capsule Take 1  capsule (20 mg total) by mouth every morning. 90 capsule 1   potassium chloride (KLOR-CON) 10 MEQ tablet Take 1 tablet (10 mEq total) by mouth every morning. 90 tablet 1   rosuvastatin (CRESTOR) 5 MG tablet Take 1 tablet (5 mg total) by mouth daily. 90 tablet 1   tiZANidine (ZANAFLEX) 4 MG tablet Take 1 tablet (4 mg total) by mouth every 8 (eight) hours as needed for muscle spasms. 90 tablet 1   No current facility-administered medications on file prior to visit.   Past Medical History:  Diagnosis Date   Benign fasciculation-cramp syndrome    Depression    Diabetes mellitus without complication (HCC)    GERD (gastroesophageal reflux disease)    Hyperlipidemia    Hypertension    Nephrolithiasis    Osteoarthritis    Bilateral knees, degenerative disc disease.  Patient has had epidural steroid injections x2   Plantar fasciitis    Past Surgical History:  Procedure Laterality Date   BREAST REDUCTION SURGERY Bilateral 1996   CESAREAN SECTION  1976   CESAREAN SECTION  1979   KNEE ARTHROSCOPY W/ MENISCAL REPAIR Left 1995   Per patient muscle was repaired   KNEE ARTHROSCOPY W/ MENISCAL REPAIR Right 2003   knee surgery Left 10/2020   meniscal repair via arthroscopy   PANNICULECTOMY     REDUCTION MAMMAPLASTY  1996   REPLACEMENT TOTAL KNEE Right 2019   TONSILLECTOMY  1960    Family History  Problem Relation Age of Onset   Stroke Mother    Heart disease Mother    Cancer Father    Diabetes Sister    Anxiety disorder Sister    Depression Sister    Diabetes Sister    Diabetes Sister    Arthritis Sister    Stroke Brother    Breast cancer Neg Hx    Social History   Socioeconomic History   Marital status: Married    Spouse name: Ronnie   Number of children: 2   Years of education: Not on file   Highest education level: 12th grade  Occupational History   Occupation: TEFL teacher   Occupation: Retired Teacher, music  Tobacco Use   Smoking status: Never   Smokeless tobacco:  Never  Vaping Use   Vaping status: Never Used  Substance and Sexual Activity   Alcohol use: Never   Drug use: Never   Sexual activity: Yes    Partners: Male  Other Topics Concern   Not on file  Social History Narrative   Patient is married.  Has 2 adult children that live in [place] (where she is from)   Social Determinants of Health   Financial Resource Strain: Low Risk  (07/11/2022)  Overall Financial Resource Strain (CARDIA)    Difficulty of Paying Living Expenses: Not hard at all  Food Insecurity: No Food Insecurity (07/11/2022)   Hunger Vital Sign    Worried About Running Out of Food in the Last Year: Never true    Ran Out of Food in the Last Year: Never true  Transportation Needs: No Transportation Needs (07/11/2022)   PRAPARE - Administrator, Civil Service (Medical): No    Lack of Transportation (Non-Medical): No  Physical Activity: Inactive (07/15/2022)   Exercise Vital Sign    Days of Exercise per Week: 0 days    Minutes of Exercise per Session: 0 min  Stress: No Stress Concern Present (07/11/2022)   [employer] of Occupational Health - Occupational Stress Questionnaire    Feeling of Stress : Not at all  Social Connections: Moderately Isolated (07/11/2022)   Social Connection and Isolation Panel [NHANES]    Frequency of Communication with Friends and Family: Three times a week    Frequency of Social Gatherings with Friends and Family: Patient declined    Attends Religious Services: Never    Diplomatic Services operational officer: No    Attends Engineer, structural: Never    Marital Status: Married    Objective:  BP 124/64   Pulse 72   Temp (!) 96.7 F (35.9 C)   Resp 14   Ht 5\' 6"  (1.676 m)   Wt 201 lb 9.6 oz (91.4 kg)   LMP  (LMP Unknown)   BMI 32.54 kg/m      10/21/2022   10:48 AM 07/15/2022   10:24 AM 02/14/2022    2:54 PM  BP/Weight  Systolic BP 124 132 132  Diastolic BP 64 74 70  Wt. (Lbs) 201.6 206 209  BMI 32.54 kg/m2  33.25 kg/m2 33.73 kg/m2    Physical Exam Vitals reviewed.  Constitutional:      Appearance: Normal appearance. She is normal weight.  Neck:     Vascular: No carotid bruit.  Cardiovascular:     Rate and Rhythm: Normal rate and regular rhythm.     Heart sounds: Normal heart sounds.  Pulmonary:     Effort: Pulmonary effort is normal. No respiratory distress.     Breath sounds: Normal breath sounds.  Abdominal:     General: Abdomen is flat. Bowel sounds are normal.     Palpations: Abdomen is soft.     Tenderness: There is no abdominal tenderness.  Musculoskeletal:        General: Tenderness (RIGHT TROCHANTERIC BURSA. NEG SLR BL.) present.  Neurological:     Mental Status: She is alert and oriented to person, place, and time.  Psychiatric:        Mood and Affect: Mood normal.        Behavior: Behavior normal.     Diabetic Foot Exam - Simple   Simple Foot Form Diabetic Foot exam was performed with the following findings: Yes 10/21/2022 11:30 AM  Visual Inspection No deformities, no ulcerations, no other skin breakdown bilaterally: Yes Sensation Testing Intact to touch and monofilament testing bilaterally: Yes Pulse Check Posterior Tibialis and Dorsalis pulse intact bilaterally: Yes Comments      Lab Results  Component Value Date   WBC 6.6 10/14/2022   HGB 13.7 10/14/2022   HCT 43.3 10/14/2022   PLT 330 10/14/2022   GLUCOSE 84 10/14/2022   CHOL 152 10/14/2022   TRIG 150 (H) 10/14/2022   HDL 52 10/14/2022  LDLCALC 74 10/14/2022   ALT 23 10/14/2022   AST 25 10/14/2022   NA 140 10/14/2022   K 4.7 10/14/2022   CL 98 10/14/2022   CREATININE 0.92 10/14/2022   BUN 23 10/14/2022   CO2 25 10/14/2022   TSH 0.948 01/28/2022   HGBA1C 6.0 (H) 10/14/2022      Assessment & Plan:    Essential hypertension, benign Assessment & Plan: Well controlled.  No changes to medicines. Atenolol-chlorthalidone 100-25 mg daily, Losartan 50 mg daily.  Continue to work on eating a  healthy diet and exercise.  Labs drawn today.   Orders: -     CBC with Differential/Platelet; Future -     Comprehensive metabolic panel; Future  Gastroesophageal reflux disease without esophagitis Assessment & Plan: The current medical regimen is effective;  continue present plan and medications.  Omeprazole 20 mg once daily.      Combined hyperlipidemia associated with type 2 diabetes mellitus (HCC) Assessment & Plan: Control: good Recommend check feet daily. Recommend annual eye exams. Medicines: Continue victoza 1.8 mg daily. Continue to work on eating a healthy diet and exercise.  Labs drawn today.     Orders: -     Hemoglobin A1c; Future  Mixed hyperlipidemia Assessment & Plan: Well controlled.  No changes to medicines. Rosuvastatin 5 mg daily.  Continue to work on eating a healthy diet and exercise.    Orders: -     Lipid panel; Future  Greater trochanteric bursitis of right hip Assessment & Plan: Sent Methylprednisolone pack.  Orders: -     methylPREDNISolone; 6 tabs on day 1, 5 tabs on day 2, 4 tabs on day 3, 3 tabs on day 4, 2 tabs on day 5,  1 tab on day 6.  Dispense: 21 tablet; Refill: 0  Class 1 obesity due to excess calories with serious comorbidity and body mass index (BMI) of 32.0 to 32.9 in adult Assessment & Plan: Recommend continue to work on eating healthy diet and exercise.    Need for antibiotic prophylaxis for dental procedure Assessment & Plan: Sent Amoxicillin  Orders: -     Amoxicillin; Take 4 capsules (2,000 mg total) by mouth once for 1 dose. One hour before dental procedure.  Dispense: 20 capsule; Refill: 0  Degeneration of intervertebral disc of lumbar region with discogenic back pain Assessment & Plan: Management per specialist. Sent Gabapentin  Orders: -     Gabapentin; Take 1 capsule (300 mg total) by mouth 3 (three) times daily.  Dispense: 270 capsule; Refill: 3     Meds ordered this encounter  Medications    amoxicillin (AMOXIL) 500 MG capsule    Sig: Take 4 capsules (2,000 mg total) by mouth once for 1 dose. One hour before dental procedure.    Dispense:  20 capsule    Refill:  0    Has numerous dental procedures coming up.   gabapentin (NEURONTIN) 300 MG capsule    Sig: Take 1 capsule (300 mg total) by mouth 3 (three) times daily.    Dispense:  270 capsule    Refill:  3   methylPREDNISolone (MEDROL DOSEPAK) 4 MG TBPK tablet    Sig: 6 tabs on day 1, 5 tabs on day 2, 4 tabs on day 3, 3 tabs on day 4, 2 tabs on day 5,  1 tab on day 6.    Dispense:  21 tablet    Refill:  0    Orders Placed This Encounter  Procedures  CBC with Differential/Platelet   Comprehensive metabolic panel   Hemoglobin A1c   Lipid panel     Follow-up: Return in about 6 months (around 04/21/2023) for chronic follow up.   I,Marla I Leal-Borjas,acting as a scribe for Blane Ohara, MD.,have documented all relevant documentation on the behalf of Blane Ohara, MD,as directed by  Blane Ohara, MD while in the presence of Blane Ohara, MD.   An After Visit Summary was printed and given to the patient.  Blane Ohara, MD [facility] [phone]

## 2022-10-21 ENCOUNTER — Encounter: Payer: Self-pay | Admitting: Family Medicine

## 2022-10-21 ENCOUNTER — Ambulatory Visit (INDEPENDENT_AMBULATORY_CARE_PROVIDER_SITE_OTHER): Payer: Medicare HMO | Admitting: Family Medicine

## 2022-10-21 VITALS — BP 124/64 | HR 72 | Temp 96.7°F | Resp 14 | Ht 66.0 in | Wt 201.6 lb

## 2022-10-21 DIAGNOSIS — K219 Gastro-esophageal reflux disease without esophagitis: Secondary | ICD-10-CM

## 2022-10-21 DIAGNOSIS — I152 Hypertension secondary to endocrine disorders: Secondary | ICD-10-CM

## 2022-10-21 DIAGNOSIS — Z6832 Body mass index (BMI) 32.0-32.9, adult: Secondary | ICD-10-CM

## 2022-10-21 DIAGNOSIS — E6609 Other obesity due to excess calories: Secondary | ICD-10-CM

## 2022-10-21 DIAGNOSIS — E782 Mixed hyperlipidemia: Secondary | ICD-10-CM | POA: Diagnosis not present

## 2022-10-21 DIAGNOSIS — M7061 Trochanteric bursitis, right hip: Secondary | ICD-10-CM

## 2022-10-21 DIAGNOSIS — E66811 Obesity, class 1: Secondary | ICD-10-CM

## 2022-10-21 DIAGNOSIS — E1169 Type 2 diabetes mellitus with other specified complication: Secondary | ICD-10-CM

## 2022-10-21 DIAGNOSIS — Z792 Long term (current) use of antibiotics: Secondary | ICD-10-CM

## 2022-10-21 DIAGNOSIS — I10 Essential (primary) hypertension: Secondary | ICD-10-CM | POA: Diagnosis not present

## 2022-10-21 DIAGNOSIS — M5136 Other intervertebral disc degeneration, lumbar region with discogenic back pain only: Secondary | ICD-10-CM

## 2022-10-21 MED ORDER — AMOXICILLIN 500 MG PO CAPS
2000.0000 mg | ORAL_CAPSULE | Freq: Once | ORAL | 0 refills | Status: DC
Start: 2022-10-21 — End: 2023-04-28

## 2022-10-21 MED ORDER — METHYLPREDNISOLONE 4 MG PO TBPK: ORAL_TABLET | ORAL | 0 refills | Status: DC

## 2022-10-21 MED ORDER — GABAPENTIN 300 MG PO CAPS
300.0000 mg | ORAL_CAPSULE | Freq: Three times a day (TID) | ORAL | 3 refills | Status: DC
Start: 2022-10-21 — End: 2023-08-11

## 2022-10-22 ENCOUNTER — Other Ambulatory Visit: Payer: Self-pay

## 2022-10-22 MED ORDER — LIRAGLUTIDE 18 MG/3ML ~~LOC~~ SOPN: 1.8000 mg | PEN_INJECTOR | Freq: Every day | SUBCUTANEOUS | 1 refills | Status: DC

## 2022-10-26 DIAGNOSIS — Z792 Long term (current) use of antibiotics: Secondary | ICD-10-CM | POA: Insufficient documentation

## 2022-10-26 NOTE — Assessment & Plan Note (Signed)
Sent Amoxicillin

## 2022-10-26 NOTE — Assessment & Plan Note (Signed)
Recommend continue to work on eating healthy diet and exercise.  

## 2022-10-26 NOTE — Assessment & Plan Note (Addendum)
Management per specialist. Sent Gabapentin

## 2022-10-26 NOTE — Assessment & Plan Note (Signed)
Sent Methylprednisolone pack.

## 2022-11-01 ENCOUNTER — Other Ambulatory Visit: Payer: Self-pay

## 2022-11-01 MED ORDER — LIRAGLUTIDE 18 MG/3ML ~~LOC~~ SOPN: 1.8000 mg | PEN_INJECTOR | Freq: Every day | SUBCUTANEOUS | 1 refills | Status: DC

## 2022-11-05 DIAGNOSIS — M25561 Pain in right knee: Secondary | ICD-10-CM | POA: Diagnosis not present

## 2022-11-11 ENCOUNTER — Other Ambulatory Visit: Payer: Self-pay

## 2022-11-11 ENCOUNTER — Other Ambulatory Visit: Payer: Self-pay | Admitting: *Deleted

## 2022-11-11 NOTE — Patient Instructions (Signed)
Visit Information  Thank you for taking time to visit with me today. Please don't hesitate to contact me if I can be of assistance to you before our next scheduled telephone appointment.  Following are the goals we discussed today:   Goals Addressed             This Visit's Progress    CCM Expected Outcome:  Monitor, Self-Manage, and Reduce Symptoms of Hypertension       Current Barriers:  Chronic Disease Management support and education needs related to effective management of HTN Financial Constraints.  BP Readings from Last 3 Encounters:  10/21/22 124/64  07/15/22 132/74  02/14/22 132/70     Planned Interventions: Evaluation of current treatment plan related to hypertension self management and patient's adherence to plan as established by provider. The patient states her blood pressures have been stable. She states that she does not check her BP at home. Provided education to patient re: stroke prevention, s/s of heart attack and stroke; Reviewed prescribed diet heart healthy/ADA diet. Review and education. The patient is compliant with dietary restrictions Reviewed medications with patient and discussed importance of compliance. Patient is compliant with medications. Denies any medication needs related to her HTN and heart health.  Discussed plans with patient for ongoing care management follow up and provided patient with direct contact information for care management team; Reviewed scheduled/upcoming provider appointments including: 11-18-2022 for AWV Advised patient to discuss changes in blood pressures or heart health with provider; Provided education on prescribed diet heart healthy/ADA;  Discussed complications of poorly controlled blood pressure such as heart disease, stroke, circulatory complications, vision complications, kidney impairment, sexual dysfunction;  Screening for signs and symptoms of depression related to chronic disease state;  Assessed social determinant of  health barriers;  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  Eye exam completed September 2024  Symptom Management: Take medications as prescribed   Attend all scheduled provider appointments Call pharmacy for medication refills 3-7 days in advance of running out of medications Call provider office for new concerns or questions  call the Suicide and Crisis Lifeline: 988 call the [place] National Suicide Prevention Lifeline: (318)153-6684 or TTY: 3463843074 TTY 607-268-1255) to talk to a trained counselor call 1-800-273-TALK (toll free, 24 hour hotline) if experiencing a Mental Health or Behavioral Health Crisis  learn about high blood pressure call doctor for signs and symptoms of high blood pressure develop an action plan for high blood pressure keep all doctor appointments take medications for blood pressure exactly as prescribed report new symptoms to your doctor  Follow Up Plan: Telephone follow up appointment with care management team member scheduled for: 01-06-2023 at 0945 am       RNCM Care Management Expected Outcome:  Monitor, Self-Manage and Reduce Symptoms of Diabetes       Current Barriers:  Care Coordination needs related to cost of Victoza and current pap in a patient with DM Chronic Disease Management support and education needs related to effective management of DM Financial Constraints.  Lab Results  Component Value Date   HGBA1C 6.0 (H) 10/14/2022     Planned Interventions: Provided education to patient about basic DM disease process. The patient has good control of her DM. Her A1C is down to 6.0. Denies any new concerns related to DM. Feels like she is doing well.  Reviewed medications with patient and discussed importance of medication adherence. Patient states she is compliant with her Victoza and  will likely need to review for 2025. She denies any acute changes in her medications at this time. Knows to call for changes.   Reviewed prescribed diet with patient heart healthy/ADA diet. Review and education given ; Counseled on importance of regular laboratory monitoring as prescribed. Has labs on a regular basis.  Discussed plans with patient for ongoing care management follow up and provided patient with direct contact information for care management team;      Provided patient with written educational materials related to hypo and hyperglycemia and importance of correct treatment. Denies any sx and sx of hypo or hyperglycemias;       Reviewed scheduled/upcoming provider appointments including: AWV 11-18-2022        Advised patient, providing education and rationale, to check cbg when you have symptoms of low or high blood sugar and record. The patient does not check her blood sugars daily and states her MD is aware call provider for findings outside established parameters;       Referral made to pharmacy team for assistance with medication questions and assistance with PAP for Victoza. Is on pill packaging system. New appointment set up with the pharm D for assistance with paperwork needs due for November 2024  Review of patient status, including review of consultants reports, relevant laboratory and other test results, and medications completed;       Advised patient to discuss changes in DM or chronic conditions with provider;      Screening for signs and symptoms of depression related to chronic disease state;        Assessed social determinant of health barriers;         Symptom Management: Take medications as prescribed   Attend all scheduled provider appointments Call pharmacy for medication refills 3-7 days in advance of running out of medications Call provider office for new concerns or questions  call the Suicide and Crisis Lifeline: 988 call the [place] National Suicide Prevention Lifeline: (814)371-8114 or TTY: 218-612-9002 TTY 217-637-6359) to talk to a trained counselor call 1-800-273-TALK (toll free,  24 hour hotline) if experiencing a Mental Health or Behavioral Health Crisis  check blood sugar at prescribed times: when you have symptoms of low or high blood sugar check feet daily for cuts, sores or redness trim toenails straight across wash and dry feet carefully every day wear comfortable, cotton socks wear comfortable, well-fitting shoes  Follow Up Plan: Telephone follow up appointment with care management team member scheduled for: 01-06-2023 at 0945 am           Our next appointment is by telephone on 01-06-2023 at 9:45 am.  Please call the care guide team at [phone] if you need to cancel or reschedule your appointment.   If you are experiencing a Mental Health or Behavioral Health Crisis or need someone to talk to, please call the Suicide and Crisis Lifeline: 988 call the [place] National Suicide Prevention Lifeline: 220-008-2461 or TTY: 231-160-0647 TTY 254-055-6057) to talk to a trained counselor call 1-800-273-TALK (toll free, 24 hour hotline) call 911   Patient verbalizes understanding of instructions and care plan provided today and agrees to view in MyChart. Active MyChart status and patient understanding of how to access instructions and care plan via MyChart confirmed with patient.     Telephone follow up appointment with care management team member scheduled for: 01-06-2023 at 9:45 am  Danise Edge, BSN RN RN Care Manager  [facility]  Ambulatory Care Management  Direct Number: [phone]

## 2022-11-11 NOTE — Patient Outreach (Signed)
Care Management   Visit Note  11/11/2022 Name: Anna Greene MRN: [id number] DOB: March 28, 1955  Subjective: Anna Greene is a 67 y.o. year old female who is a primary care patient of Cox, Kirsten, MD. The Care Management team was consulted for assistance.      Engaged with patient spoke with patient by telephone.   Goals Addressed             This Visit's Progress    CCM Expected Outcome:  Monitor, Self-Manage, and Reduce Symptoms of Hypertension       Current Barriers:  Chronic Disease Management support and education needs related to effective management of HTN Financial Constraints.  BP Readings from Last 3 Encounters:  10/21/22 124/64  07/15/22 132/74  02/14/22 132/70     Planned Interventions: Evaluation of current treatment plan related to hypertension self management and patient's adherence to plan as established by provider. The patient states her blood pressures have been stable. She states that she does not check her BP at home. Provided education to patient re: stroke prevention, s/s of heart attack and stroke; Reviewed prescribed diet heart healthy/ADA diet. Review and education. The patient is compliant with dietary restrictions Reviewed medications with patient and discussed importance of compliance. Patient is compliant with medications. Denies any medication needs related to her HTN and heart health.  Discussed plans with patient for ongoing care management follow up and provided patient with direct contact information for care management team; Reviewed scheduled/upcoming provider appointments including: 11-18-2022 for AWV Advised patient to discuss changes in blood pressures or heart health with provider; Provided education on prescribed diet heart healthy/ADA;  Discussed complications of poorly controlled blood pressure such as heart disease, stroke, circulatory complications, vision complications, kidney impairment, sexual dysfunction;  Screening for signs and symptoms  of depression related to chronic disease state;  Assessed social determinant of health barriers;  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  Eye exam completed September 2024  Symptom Management: Take medications as prescribed   Attend all scheduled provider appointments Call pharmacy for medication refills 3-7 days in advance of running out of medications Call provider office for new concerns or questions  call the Suicide and Crisis Lifeline: 988 call the [place] National Suicide Prevention Lifeline: 332-420-9445 or TTY: 267-571-1796 TTY 763-382-6167) to talk to a trained counselor call 1-800-273-TALK (toll free, 24 hour hotline) if experiencing a Mental Health or Behavioral Health Crisis  learn about high blood pressure call doctor for signs and symptoms of high blood pressure develop an action plan for high blood pressure keep all doctor appointments take medications for blood pressure exactly as prescribed report new symptoms to your doctor  Follow Up Plan: Telephone follow up appointment with care management team member scheduled for: 01-06-2023 at 0945 am       RNCM Care Management Expected Outcome:  Monitor, Self-Manage and Reduce Symptoms of Diabetes       Current Barriers:  Care Coordination needs related to cost of Victoza and current pap in a patient with DM Chronic Disease Management support and education needs related to effective management of DM Financial Constraints.  Lab Results  Component Value Date   HGBA1C 6.0 (H) 10/14/2022     Planned Interventions: Provided education to patient about basic DM disease process. The patient has good control of her DM. Her A1C is down to 6.0. Denies any new concerns related to DM. Feels like she is doing well.  Reviewed medications with  patient and discussed importance of medication adherence. Patient states she is compliant with her Victoza and will likely need to review for 2025. She denies any  acute changes in her medications at this time. Knows to call for changes.  Reviewed prescribed diet with patient heart healthy/ADA diet. Review and education given ; Counseled on importance of regular laboratory monitoring as prescribed. Has labs on a regular basis.  Discussed plans with patient for ongoing care management follow up and provided patient with direct contact information for care management team;      Provided patient with written educational materials related to hypo and hyperglycemia and importance of correct treatment. Denies any sx and sx of hypo or hyperglycemias;       Reviewed scheduled/upcoming provider appointments including: AWV 11-18-2022        Advised patient, providing education and rationale, to check cbg when you have symptoms of low or high blood sugar and record. The patient does not check her blood sugars daily and states her MD is aware call provider for findings outside established parameters;       Referral made to pharmacy team for assistance with medication questions and assistance with PAP for Victoza. Is on pill packaging system. New appointment set up with the pharm D for assistance with paperwork needs due for November 2024  Review of patient status, including review of consultants reports, relevant laboratory and other test results, and medications completed;       Advised patient to discuss changes in DM or chronic conditions with provider;      Screening for signs and symptoms of depression related to chronic disease state;        Assessed social determinant of health barriers;         Symptom Management: Take medications as prescribed   Attend all scheduled provider appointments Call pharmacy for medication refills 3-7 days in advance of running out of medications Call provider office for new concerns or questions  call the Suicide and Crisis Lifeline: 988 call the [place] National Suicide Prevention Lifeline: 4145061416 or TTY: 773-233-7893 TTY  251-255-7909) to talk to a trained counselor call 1-800-273-TALK (toll free, 24 hour hotline) if experiencing a Mental Health or Behavioral Health Crisis  check blood sugar at prescribed times: when you have symptoms of low or high blood sugar check feet daily for cuts, sores or redness trim toenails straight across wash and dry feet carefully every day wear comfortable, cotton socks wear comfortable, well-fitting shoes  Follow Up Plan: Telephone follow up appointment with care management team member scheduled for: 01-06-2023 at 0945 am             Consent to Services:  Patient was given information about care management services, agreed to services, and gave verbal consent to participate.   Plan: Telephone follow up appointment with care management team member scheduled for: 01-06-2023 at 9:45 am.  Danise Edge, BSN RN RN Care Manager  [facility]  Ambulatory Care Management  Direct Number: [phone]

## 2022-11-18 ENCOUNTER — Encounter: Payer: Self-pay | Admitting: Family Medicine

## 2022-11-18 ENCOUNTER — Ambulatory Visit (INDEPENDENT_AMBULATORY_CARE_PROVIDER_SITE_OTHER): Payer: Medicare HMO | Admitting: Family Medicine

## 2022-11-18 VITALS — BP 130/80 | HR 75 | Temp 97.0°F | Resp 14 | Ht 66.0 in | Wt 204.0 lb

## 2022-11-18 DIAGNOSIS — Z0000 Encounter for general adult medical examination without abnormal findings: Secondary | ICD-10-CM | POA: Diagnosis not present

## 2022-11-18 NOTE — Patient Instructions (Signed)

## 2022-11-18 NOTE — Progress Notes (Signed)
Subjective:   Anna Greene is a 67 y.o. female who presents for Medicare Annual (Subsequent) preventive examination.  Visit Complete: In person  Patient Medicare AWV questionnaire was completed by the patient on 11/18/22; I have confirmed that all information answered by patient is correct and no changes since this date.  Cardiac Risk Factors include: advanced age (>22men, >64 women);hypertension     Objective:    Today's Vitals   11/18/22 0755  BP: 130/80  Pulse: 75  Resp: 14  Temp: (!) 97 F (36.1 C)  SpO2: 99%  Weight: 204 lb (92.5 kg)  Height: 5\' 6"  (1.676 m)   Body mass index is 32.93 kg/m.     11/18/2022    8:00 AM 05/01/2020   10:01 AM  Advanced Directives  Does Patient Have a Medical Advance Directive? No No  Would patient like information on creating a medical advance directive? No - Patient declined No - Patient declined    Current Medications (verified) Outpatient Encounter Medications as of 11/18/2022  Medication Sig   atenolol-chlorthalidone (TENORETIC) 100-25 MG tablet Take 1 tablet by mouth daily.   celecoxib (CELEBREX) 200 MG capsule Take 1 capsule (200 mg total) by mouth 2 (two) times daily.   cetirizine (ZYRTEC) 10 MG tablet Take 1 tablet (10 mg total) by mouth daily.   clonazePAM (KLONOPIN) 1 MG tablet TAKE ONE TABLET BY MOUTH AT bedtime AS NEEDED FOR ANXIETY   EASY TOUCH PEN NEEDLES 32G X 6 MM MISC USE TO INEJCT VICTOZA DAILY AS DIRECTED *REFILL REQUEST*   gabapentin (NEURONTIN) 300 MG capsule Take 1 capsule (300 mg total) by mouth 3 (three) times daily.   liraglutide (VICTOZA) 18 MG/3ML SOPN Inject 1.8 mg into the skin daily.   losartan (COZAAR) 50 MG tablet Take 1 tablet (50 mg total) by mouth daily.   methylPREDNISolone (MEDROL DOSEPAK) 4 MG TBPK tablet 6 tabs on day 1, 5 tabs on day 2, 4 tabs on day 3, 3 tabs on day 4, 2 tabs on day 5,  1 tab on day 6.   omega-3 acid ethyl esters (LOVAZA) 1 g capsule Take 2 capsules (2 g total) by mouth 2 (two)  times daily.   omeprazole (PRILOSEC) 20 MG capsule Take 1 capsule (20 mg total) by mouth every morning.   potassium chloride (KLOR-CON) 10 MEQ tablet Take 1 tablet (10 mEq total) by mouth every morning.   rosuvastatin (CRESTOR) 5 MG tablet Take 1 tablet (5 mg total) by mouth daily.   tiZANidine (ZANAFLEX) 4 MG tablet Take 1 tablet (4 mg total) by mouth every 8 (eight) hours as needed for muscle spasms.   No facility-administered encounter medications on file as of 11/18/2022.    Allergies (verified) Metformin and related; Ramipril; and Tuberculin, ppd   History: Past Medical History:  Diagnosis Date   Benign fasciculation-cramp syndrome    Depression    Diabetes mellitus without complication (HCC)    GERD (gastroesophageal reflux disease)    Hyperlipidemia    Hypertension    Nephrolithiasis    Osteoarthritis    Bilateral knees, degenerative disc disease.  Patient has had epidural steroid injections x2   Plantar fasciitis    Past Surgical History:  Procedure Laterality Date   BREAST REDUCTION SURGERY Bilateral 1996   CESAREAN SECTION  1976   CESAREAN SECTION  1979   KNEE ARTHROSCOPY W/ MENISCAL REPAIR Left 1995   Per patient muscle was repaired   KNEE ARTHROSCOPY W/ MENISCAL REPAIR Right 2003   knee  surgery Left 10/2020   meniscal repair via arthroscopy   PANNICULECTOMY     REDUCTION MAMMAPLASTY  1996   REPLACEMENT TOTAL KNEE Right 2019   TONSILLECTOMY  1960   Family History  Problem Relation Age of Onset   Stroke Mother    Heart disease Mother    Cancer Father    Diabetes Sister    Anxiety disorder Sister    Depression Sister    Diabetes Sister    Diabetes Sister    Arthritis Sister    Stroke Brother    Breast cancer Neg Hx    Social History   Socioeconomic History   Marital status: Married    Spouse name: Christen Bame   Number of children: 2   Years of education: Not on file   Highest education level: 12th grade  Occupational History   Occupation: Research scientist (life sciences)   Occupation: Retired Teacher, music  Tobacco Use   Smoking status: Never   Smokeless tobacco: Never  Vaping Use   Vaping status: Never Used  Substance and Sexual Activity   Alcohol use: Never   Drug use: Never   Sexual activity: Yes    Partners: Male  Other Topics Concern   Not on file  Social History Narrative   Patient is married.  Has 2 adult children that live in [place] (where she is from)   Social Determinants of Health   Financial Resource Strain: Low Risk  (07/11/2022)   Overall Financial Resource Strain (CARDIA)    Difficulty of Paying Living Expenses: Not hard at all  Food Insecurity: No Food Insecurity (07/11/2022)   Hunger Vital Sign    Worried About Running Out of Food in the Last Year: Never true    Ran Out of Food in the Last Year: Never true  Transportation Needs: No Transportation Needs (07/11/2022)   PRAPARE - Administrator, Civil Service (Medical): No    Lack of Transportation (Non-Medical): No  Physical Activity: Sufficiently Active (11/11/2022)   Exercise Vital Sign    Days of Exercise per Week: 4 days    Minutes of Exercise per Session: 60 min  Stress: No Stress Concern Present (07/11/2022)   [employer] of Occupational Health - Occupational Stress Questionnaire    Feeling of Stress : Not at all  Social Connections: Moderately Isolated (11/11/2022)   Social Connection and Isolation Panel [NHANES]    Frequency of Communication with Friends and Family: Twice a week    Frequency of Social Gatherings with Friends and Family: More than three times a week    Attends Religious Services: Never    Database administrator or Organizations: No    Attends Engineer, structural: Never    Marital Status: Married    Tobacco Counseling Counseling given: Not Answered   Clinical Intake:  Pre-visit preparation completed: Yes  Pain : No/denies pain     BMI - recorded: 32.93 Nutritional Status: BMI > 30  Obese Nutritional  Risks: None Diabetes: No CBG done?: No Did pt. bring in CBG monitor from home?: No  How often do you need to have someone help you when you read instructions, pamphlets, or other written materials from your doctor or pharmacy?: 1 - Never What is the last grade level you completed in school?: [employer] diploma  Interpreter Needed?: No      Activities of Daily Living    11/14/2022    1:36 PM  In your present state of health, do you have  any difficulty performing the following activities:  Hearing? 0  Vision? 0  Difficulty concentrating or making decisions? 0  Walking or climbing stairs? 0  Dressing or bathing? 0  Doing errands, shopping? 0  Preparing Food and eating ? N  Using the Toilet? N  In the past six months, have you accidently leaked urine? N  Do you have problems with loss of bowel control? N  Managing your Medications? N  Managing your Finances? N  Housekeeping or managing your Housekeeping? N    Patient Care Team: Blane Ohara, MD as PCP - General (Family Medicine) Gean Birchwood, MD as Consulting Physician (Orthopedic Surgery) Genia Del Daisy Blossom, MD as Consulting Physician (Ophthalmology) Marlowe Sax, RN as Case Manager (General Practice)  Indicate any recent Medical Services you may have received from other than [facility] providers in the past year (date may be approximate).     Assessment:   This is a routine wellness examination for [place].  Hearing/Vision screen No results found.   Goals Addressed             This Visit's Progress    Track and Manage My Blood Pressure-Hypertension   On track    Timeframe:  Long-Range Goal Priority:  High Start Date:                             Expected End Date:                       Follow Up Date 01/01/22   - choose a place to take my blood pressure (home, clinic or office, retail store) - write blood pressure results in a log or diary    Why is this important?   You won't feel high blood pressure,  but it can still hurt your blood vessels.  High blood pressure can cause heart or kidney problems. It can also cause a stroke.  Making lifestyle changes like losing a little weight or eating less salt will help.  Checking your blood pressure at home and at different times of the day can help to control blood pressure.  If the doctor prescribes medicine remember to take it the way the doctor ordered.  Call the office if you cannot afford the medicine or if there are questions about it.     Notes:       Depression Screen    10/21/2022   10:51 AM 07/15/2022   10:32 AM 02/04/2022   10:39 AM 10/01/2021    1:32 PM 05/28/2021    2:31 PM 05/14/2021    8:23 AM 11/13/2020   10:59 AM  PHQ 2/9 Scores  PHQ - 2 Score 0 0 0 0 0 0 0  PHQ- 9 Score 1 0  0 2 2 0    Fall Risk    11/14/2022    1:36 PM 11/11/2022    9:49 AM 10/21/2022   10:51 AM 07/15/2022   10:31 AM 02/04/2022   10:39 AM  Fall Risk   Falls in the past year? 0 0 0 0 0  Number falls in past yr: 0 0 0 0 0  Injury with Fall? 0 0 0 0 0  Risk for fall due to : No Fall Risks No Fall Risks No Fall Risks No Fall Risks No Fall Risks  Follow up Education provided Falls evaluation completed Falls evaluation completed;Falls prevention discussed Falls evaluation completed Falls evaluation completed  MEDICARE RISK AT HOME: Medicare Risk at Home Any stairs in or around the home?: Yes If so, are there any without handrails?: Yes Home free of loose throw rugs in walkways, pet beds, electrical cords, etc?: No Adequate lighting in your home to reduce risk of falls?: Yes Life alert?: No Use of a cane, walker or w/c?: No Grab bars in the bathroom?: No Shower chair or bench in shower?: No Elevated toilet seat or a handicapped toilet?: Yes  TIMED UP AND GO:  Was the test performed?  Yes  Length of time to ambulate 10 feet: 8 sec Gait steady and fast without use of assistive device    Cognitive Function:        11/18/2022    8:05 AM 05/28/2021     2:33 PM  6CIT Screen  What Year? 0 points 0 points  What month? 0 points 0 points  What time? 0 points 0 points  Count back from 20 0 points 0 points  Months in reverse 0 points 0 points  Repeat phrase 0 points 0 points  Total Score 0 points 0 points    Immunizations Immunization History  Administered Date(s) Administered   Fluad Quad(high Dose 65+) 10/27/2021   Influenza, High Dose Seasonal PF 07/21/2021   Influenza, Quadrivalent, Recombinant, Inj, Pf 10/23/2016   Influenza,inj,Quad PF,6+ Mos 10/07/2015   Influenza-Unspecified 10/06/2009, 11/15/2019, 10/31/2020   Moderna Covid-19 Fall Seasonal Vaccine 63yrs & older 10/27/2021   Moderna Sars-Covid-2 Vaccination 03/12/2019, 04/14/2019, 11/15/2019, 05/27/2020, 10/31/2020   PNEUMOCOCCAL CONJUGATE-20 11/12/2021   Pneumococcal Conjugate-13 10/08/2018, 07/18/2021   Pneumococcal Polysaccharide-23 02/15/2020   RSV,unspecified 11/12/2021   Tdap 08/03/2008, 02/15/2013    TDAP status: Up to date  Flu Vaccine status: Up to date  Pneumococcal vaccine status: Up to date  Covid-19 vaccine status: Completed vaccines  Qualifies for Shingles Vaccine? Yes   Zostavax completed No   Shingrix Completed?: No.    Education has been provided regarding the importance of this vaccine. Patient has been advised to call insurance company to determine out of pocket expense if they have not yet received this vaccine. Advised may also receive vaccine at local pharmacy or Health Dept. Verbalized acceptance and understanding.  Screening Tests Health Maintenance  Topic Date Due   Hepatitis C Screening  Never done   Zoster Vaccines- Shingrix (1 of 2) Never done   INFLUENZA VACCINE  08/15/2022   COVID-19 Vaccine (7 - 2023-24 season) 09/15/2022   Diabetic kidney evaluation - Urine ACR  02/05/2023   DTaP/Tdap/Td (3 - Td or Tdap) 02/16/2023   HEMOGLOBIN A1C  04/13/2023   Diabetic kidney evaluation - eGFR measurement  10/14/2023   OPHTHALMOLOGY EXAM   10/14/2023   FOOT EXAM  10/21/2023   Medicare Annual Wellness (AWV)  11/18/2023   MAMMOGRAM  04/23/2024   Colonoscopy  05/09/2024   Pneumonia Vaccine 45+ Years old  Completed   DEXA SCAN  Completed   HPV VACCINES  Aged Out    Health Maintenance  Health Maintenance Due  Topic Date Due   Hepatitis C Screening  Never done   Zoster Vaccines- Shingrix (1 of 2) Never done   INFLUENZA VACCINE  08/15/2022   COVID-19 Vaccine (7 - 2023-24 season) 09/15/2022    Colorectal cancer screening: Type of screening: Colonoscopy. Completed 2018. Repeat every 10 years  Mammogram status: Completed 04/24/2022. Repeat every year  Bone Density status: Completed 08/16/2022. Results reflect: Bone density results: NORMAL. Repeat every 2 years.  Lung Cancer Screening: (Low Dose CT  Chest recommended if Age 67-80 years, 20 pack-year currently smoking OR have quit w/in 15years.) does not qualify.   Additional Screening:  Hepatitis C Screening: does qualify; Will complete on 04/28/2022  Vision Screening: Recommended annual ophthalmology exams for early detection of glaucoma and other disorders of the eye. Is the patient up to date with their annual eye exam?  Yes  Who is the provider or what is the name of the office in which the patient attends annual eye exams? [facility], Dr. Rema Jasmine If pt is not established with a provider, would they like to be referred to a provider to establish care? No .   Dental Screening: Recommended annual dental exams for proper oral hygiene  Diabetic Foot Exam: Diabetic Foot Exam: Completed 10/21/22  Community Resource Referral / Chronic Care Management: CRR required this visit?  No   CCM required this visit?  No     Plan:     I have personally reviewed and noted the following in the patient's chart:   Medical and social history Use of alcohol, tobacco or illicit drugs  Current medications and supplements including opioid prescriptions. Patient is not  currently taking opioid prescriptions. Functional ability and status Nutritional status Physical activity Advanced directives List of other physicians Hospitalizations, surgeries, and ER visits in previous 12 months Vitals Screenings to include cognitive, depression, and falls Referrals and appointments  In addition, I have reviewed and discussed with patient certain preventive protocols, quality metrics, and best practice recommendations. A written personalized care plan for preventive services as well as general preventive health recommendations were provided to patient.     Eugenie Norrie, CMA   11/18/2022   After Visit Summary: (In Person-Printed) AVS printed and given to the patient  Nurse Notes:    Lajuana Matte, FNP [facility] [phone]

## 2022-11-28 ENCOUNTER — Telehealth: Payer: Self-pay

## 2022-11-28 NOTE — Telephone Encounter (Signed)
[employer] patient assistance application faxed for [employer]

## 2022-12-09 DIAGNOSIS — M5416 Radiculopathy, lumbar region: Secondary | ICD-10-CM | POA: Diagnosis not present

## 2022-12-13 ENCOUNTER — Telehealth: Payer: Self-pay | Admitting: *Deleted

## 2022-12-13 NOTE — Progress Notes (Signed)
  Care Coordination Note  12/13/2022 Name: Anna Greene MRN: [id number] DOB: [birth year]  Anna Greene is a 67 y.o. year old female who is a primary care patient of Cox, Kirsten, MD and is actively engaged with the care management team. I reached out to [name] by phone today to assist with re-scheduling a follow up visit with the RN Case Manager  Follow up plan: Unsuccessful telephone outreach attempt made. A HIPAA compliant phone message was left for the patient providing contact information and requesting a return call.   [facility]  Care Coordination Care Guide  Direct Dial: [phone]

## 2022-12-13 NOTE — Progress Notes (Signed)
  Care Coordination Note  12/13/2022 Name: Errica Lauletta MRN: [id number] DOB: 03-01-1955  Kayren Kissling is a 67 y.o. year old female who is a primary care patient of Cox, Kirsten, MD and is actively engaged with the care management team. I reached out to [name] by phone today to assist with re-scheduling a follow up visit with the RN Case Manager  Follow up plan: Telephone appointment with care management team member scheduled for:01/06/23  [facility] Coordination Care Guide  Direct Dial: [phone]

## 2023-01-03 ENCOUNTER — Telehealth: Payer: Self-pay

## 2023-01-03 MED ORDER — EASY TOUCH PEN NEEDLES 32G X 6 MM MISC: 10 refills | Status: DC

## 2023-01-03 NOTE — Telephone Encounter (Signed)
Copied from CRM [id number]. Topic: Clinical - Medication Refill >> Jan 03, 2023 11:13 AM Thomes Dinning wrote: Most Recent Primary Care Visit:  Provider: Renne Crigler  Department: [facility]  Visit Type: MEDICARE AWV, SEQUENTIAL  Date: 11/18/2022  Medication: EASY TOUCH PEN NEEDLES 32G X 6 MM MISC  Has the patient contacted their pharmacy? Yes (Agent: If no, request that the patient contact the pharmacy for the refill. If patient does not wish to contact the pharmacy document the reason why and proceed with request.) (Agent: If yes, when and what did the pharmacy advise?)  Is this the correct pharmacy for this prescription? Yes If no, delete pharmacy and type the correct one.  This is the patient's preferred pharmacy:  [facility] [address] [place] - [address] [place] [address] Phone: [phone] Fax: [phone]   Has the prescription been filled recently? Yes  Is the patient out of the medication? Yes  Has the patient been seen for an appointment in the last year OR does the patient have an upcoming appointment? Yes  Can we respond through MyChart? Yes  Agent: Please be advised that Rx refills may take up to 3 business days. We ask that you follow-up with your pharmacy.

## 2023-01-06 ENCOUNTER — Telehealth: Payer: Self-pay | Admitting: *Deleted

## 2023-01-06 ENCOUNTER — Other Ambulatory Visit: Payer: Self-pay | Admitting: *Deleted

## 2023-01-06 ENCOUNTER — Other Ambulatory Visit: Payer: Medicare HMO

## 2023-01-06 NOTE — Patient Outreach (Signed)
  Care Management   Follow Up Note   01/06/2023 Name: Anna Greene MRN: [id number] DOB: 13-Jun-1955   Referred by: Blane Ohara, MD Reason for referral : Care Management (RNCM: Attempt to follow up for Chronic Disease Management & Care Coordination Needs)   An unsuccessful telephone outreach was attempted today. The patient was referred to the case management team for assistance with care management and care coordination.   Follow Up Plan: The care management team will reach out to the patient again over the next 30 days.   Danise Edge, BSN RN RN Care Manager  [place]  Ambulatory Care Management  Direct Number: [phone]

## 2023-01-06 NOTE — Patient Instructions (Signed)
Visit Information  Thank you for taking time to visit with me today. Please don't hesitate to contact me if I can be of assistance to you before our next scheduled telephone appointment.  Following are the goals we discussed today:   Goals Addressed             This Visit's Progress    CCM Expected Outcome:  Monitor, Self-Manage, and Reduce Symptoms of Hypertension       Current Barriers:  Chronic Disease Management support and education needs related to effective management of HTN Financial Constraints.  BP Readings from Last 3 Encounters:  11/18/22 130/80  10/21/22 124/64  07/15/22 132/74     Planned Interventions: Evaluation of current treatment plan related to hypertension self management and patient's adherence to plan as established by provider. The patient states her blood pressures have been stable. She states checks her blood pressure once per week. Reports most recent reading of 130/76. Provided education to patient re: stroke prevention, s/s of heart attack and stroke; Reviewed prescribed diet heart healthy/ADA diet. Review and education. The patient is compliant with dietary restrictions Reviewed medications with patient and discussed importance of compliance. Patient is compliant with medications.   Discussed plans with patient for ongoing care management follow up and provided patient with direct contact information for care management team; Reviewed scheduled/upcoming provider appointments including: 05-05-2023 w/ PCP Advised patient to discuss changes in blood pressures or heart health with provider; Provided education on prescribed diet heart healthy/ADA;  Discussed complications of poorly controlled blood pressure such as heart disease, stroke, circulatory complications, vision complications, kidney impairment, sexual dysfunction;  Screening for signs and symptoms of depression related to chronic disease state;  Assessed social determinant of health barriers;  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  Eye exam completed September 2024  Symptom Management: Take medications as prescribed   Attend all scheduled provider appointments Call pharmacy for medication refills 3-7 days in advance of running out of medications Call provider office for new concerns or questions  call the Suicide and Crisis Lifeline: 988 call the [place] National Suicide Prevention Lifeline: 318-250-5729 or TTY: 947-217-7591 TTY 857-114-8355) to talk to a trained counselor call 1-800-273-TALK (toll free, 24 hour hotline) if experiencing a Mental Health or Behavioral Health Crisis  learn about high blood pressure call doctor for signs and symptoms of high blood pressure develop an action plan for high blood pressure keep all doctor appointments take medications for blood pressure exactly as prescribed report new symptoms to your doctor  Follow Up Plan: Telephone follow up appointment with care management team member scheduled for: 03-10-2023 at 9:30  am       RNCM Care Management Expected Outcome:  Monitor, Self-Manage and Reduce Symptoms of Diabetes       Current Barriers:  Care Coordination needs related to cost of Victoza and current pap in a patient with DM Chronic Disease Management support and education needs related to effective management of DM Financial Constraints.  Lab Results  Component Value Date   HGBA1C 6.0 (H) 10/14/2022     Planned Interventions: Provided education to patient about basic DM disease process. The patient has good control of her DM. Her A1C is down to 6.0. Denies any new concerns related to DM. Feels like she is doing well. Does not check blood sugar and states she manages her DM with her diet. Reviewed medications with patient and discussed importance of medication adherence. Patient  states she is compliant with her Victoza but will likely not be taking in in 2025 due to some new policy changes with the  manufacturer. She states they are now requiring a credit check for patient assistance and she is not in agreement with the change. Reviewed prescribed diet with patient heart healthy/ADA diet. Reports compliance with diet Counseled on importance of regular laboratory monitoring as prescribed. Has labs on a regular basis.  Discussed plans with patient for ongoing care management follow up and provided patient with direct contact information for care management team;      Provided patient with written educational materials related to hypo and hyperglycemia and importance of correct treatment. Denies any sx and sx of hypo or hyperglycemias;       Reviewed scheduled/upcoming provider appointments including:PCP 05-05-2023        Advised patient, providing education and rationale, to check cbg when you have symptoms of low or high blood sugar and record. The patient does not check her blood sugars daily and states her MD is aware call provider for findings outside established parameters;       Referral made to pharmacy team for assistance with medication questions and assistance with PAP for Victoza. RNCM will follow up with PharmD about new requirements for PAP. Review of patient status, including review of consultants reports, relevant laboratory and other test results, and medications completed;       Advised patient to discuss changes in DM or chronic conditions with provider;      Screening for signs and symptoms of depression related to chronic disease state;        Assessed social determinant of health barriers;         Symptom Management: Take medications as prescribed   Attend all scheduled provider appointments Call pharmacy for medication refills 3-7 days in advance of running out of medications Call provider office for new concerns or questions  call the Suicide and Crisis Lifeline: 988 call the [place] National Suicide Prevention Lifeline: 806-533-2165 or TTY: 6161753776 TTY (403) 621-3913)  to talk to a trained counselor call 1-800-273-TALK (toll free, 24 hour hotline) if experiencing a Mental Health or Behavioral Health Crisis  check blood sugar at prescribed times: when you have symptoms of low or high blood sugar check feet daily for cuts, sores or redness trim toenails straight across wash and dry feet carefully every day wear comfortable, cotton socks wear comfortable, well-fitting shoes  Follow Up Plan: Telephone follow up appointment with care management team member scheduled for: 03-10-2023 at 9:30 am           Our next appointment is by telephone on 03-10-2023 at 9:30 am  Please call the care guide team at [phone] if you need to cancel or reschedule your appointment.   If you are experiencing a Mental Health or Behavioral Health Crisis or need someone to talk to, please call the Suicide and Crisis Lifeline: 988 call the [place] National Suicide Prevention Lifeline: 586-758-8757 or TTY: 8145778500 TTY 930-807-2894) to talk to a trained counselor call 1-800-273-TALK (toll free, 24 hour hotline) call 911   Patient verbalizes understanding of instructions and care plan provided today and agrees to view in MyChart. Active MyChart status and patient understanding of how to access instructions and care plan via MyChart confirmed with patient.     Danise Edge, BSN RN RN Care Manager  [place] [facility]  Ambulatory Care Management  Direct Number: [phone]

## 2023-01-06 NOTE — Patient Outreach (Signed)
Care Management   Visit Note  01/06/2023 Name: Anna Greene MRN: [id number] DOB: 1955-01-18  Subjective: Anna Greene is a 67 y.o. year old female who is a primary care patient of Cox, Kirsten, MD. The Care Management team was consulted for assistance.      Engaged with patient spoke with patient by telephone.    Goals Addressed             This Visit's Progress    CCM Expected Outcome:  Monitor, Self-Manage, and Reduce Symptoms of Hypertension       Current Barriers:  Chronic Disease Management support and education needs related to effective management of HTN Financial Constraints.  BP Readings from Last 3 Encounters:  11/18/22 130/80  10/21/22 124/64  07/15/22 132/74     Planned Interventions: Evaluation of current treatment plan related to hypertension self management and patient's adherence to plan as established by provider. The patient states her blood pressures have been stable. She states checks her blood pressure once per week. Reports most recent reading of 130/76. Provided education to patient re: stroke prevention, s/s of heart attack and stroke; Reviewed prescribed diet heart healthy/ADA diet. Review and education. The patient is compliant with dietary restrictions Reviewed medications with patient and discussed importance of compliance. Patient is compliant with medications.   Discussed plans with patient for ongoing care management follow up and provided patient with direct contact information for care management team; Reviewed scheduled/upcoming provider appointments including: 05-05-2023 w/ PCP Advised patient to discuss changes in blood pressures or heart health with provider; Provided education on prescribed diet heart healthy/ADA;  Discussed complications of poorly controlled blood pressure such as heart disease, stroke, circulatory complications, vision complications, kidney impairment, sexual dysfunction;  Screening for signs and symptoms of depression related  to chronic disease state;  Assessed social determinant of health barriers;  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  Eye exam completed September 2024  Symptom Management: Take medications as prescribed   Attend all scheduled provider appointments Call pharmacy for medication refills 3-7 days in advance of running out of medications Call provider office for new concerns or questions  call the Suicide and Crisis Lifeline: 988 call the [place] National Suicide Prevention Lifeline: (601) 508-4290 or TTY: 203-581-1121 TTY 714 572 9216) to talk to a trained counselor call 1-800-273-TALK (toll free, 24 hour hotline) if experiencing a Mental Health or Behavioral Health Crisis  learn about high blood pressure call doctor for signs and symptoms of high blood pressure develop an action plan for high blood pressure keep all doctor appointments take medications for blood pressure exactly as prescribed report new symptoms to your doctor  Follow Up Plan: Telephone follow up appointment with care management team member scheduled for: 03-10-2023 at 9:30  am       RNCM Care Management Expected Outcome:  Monitor, Self-Manage and Reduce Symptoms of Diabetes       Current Barriers:  Care Coordination needs related to cost of Victoza and current pap in a patient with DM Chronic Disease Management support and education needs related to effective management of DM Financial Constraints.  Lab Results  Component Value Date   HGBA1C 6.0 (H) 10/14/2022     Planned Interventions: Provided education to patient about basic DM disease process. The patient has good control of her DM. Her A1C is down to 6.0. Denies any new concerns related to DM. Feels like she is doing well. Does not check blood sugar and states she  manages her DM with her diet. Reviewed medications with patient and discussed importance of medication adherence. Patient states she is compliant with her Victoza but will  likely not be taking in in 2025 due to some new policy changes with the manufacturer. She states they are now requiring a credit check for patient assistance and she is not in agreement with the change. Reviewed prescribed diet with patient heart healthy/ADA diet. Reports compliance with diet Counseled on importance of regular laboratory monitoring as prescribed. Has labs on a regular basis.  Discussed plans with patient for ongoing care management follow up and provided patient with direct contact information for care management team;      Provided patient with written educational materials related to hypo and hyperglycemia and importance of correct treatment. Denies any sx and sx of hypo or hyperglycemias;       Reviewed scheduled/upcoming provider appointments including:PCP 05-05-2023        Advised patient, providing education and rationale, to check cbg when you have symptoms of low or high blood sugar and record. The patient does not check her blood sugars daily and states her MD is aware call provider for findings outside established parameters;       Referral made to pharmacy team for assistance with medication questions and assistance with PAP for Victoza. RNCM will follow up with PharmD about new requirements for PAP. Review of patient status, including review of consultants reports, relevant laboratory and other test results, and medications completed;       Advised patient to discuss changes in DM or chronic conditions with provider;      Screening for signs and symptoms of depression related to chronic disease state;        Assessed social determinant of health barriers;         Symptom Management: Take medications as prescribed   Attend all scheduled provider appointments Call pharmacy for medication refills 3-7 days in advance of running out of medications Call provider office for new concerns or questions  call the Suicide and Crisis Lifeline: 988 call the [place] National Suicide  Prevention Lifeline: 320-450-2378 or TTY: (914)477-2192 TTY (989)744-1104) to talk to a trained counselor call 1-800-273-TALK (toll free, 24 hour hotline) if experiencing a Mental Health or Behavioral Health Crisis  check blood sugar at prescribed times: when you have symptoms of low or high blood sugar check feet daily for cuts, sores or redness trim toenails straight across wash and dry feet carefully every day wear comfortable, cotton socks wear comfortable, well-fitting shoes  Follow Up Plan: Telephone follow up appointment with care management team member scheduled for: 03-10-2023 at 9:30 am           Consent to Services:  Patient was given information about care management services, agreed to services, and gave verbal consent to participate.   Plan: Telephone follow up appointment with care management team member scheduled for:03-10-2023 at 9:30 am  Danise Edge, BSN RN RN Care Manager  [facility]  Ambulatory Care Management  Direct Number: [phone]

## 2023-01-06 NOTE — Patient Outreach (Signed)
  Care Management   Follow Up Note   01/06/2023 Name: Saher Hickel MRN: [id number] DOB: 03-Jun-1955   Referred by: Blane Ohara, MD Reason for referral : Care Management (RNCM: ATTEMPT (2nd) to follow up for Chronic Disease Management & Care Coordination Needs)   An unsuccessful telephone outreach was attempted today. The patient was referred to the case management team for assistance with care management and care coordination.   Follow Up Plan: The care management team will reach out to the patient again over the next 30 days.   Danise Edge, BSN RN RN Care Manager  [place]  Ambulatory Care Management  Direct Number: [phone]

## 2023-01-14 ENCOUNTER — Telehealth: Payer: Self-pay

## 2023-01-14 NOTE — Telephone Encounter (Signed)
PAP: PAP application for Victoza, [employer]) has been mailed to pt's home address on file. Will fax provider portion of application to provider's office when pt's portion is received.

## 2023-01-17 NOTE — Telephone Encounter (Signed)
 PAP: Application for Victoza has been submitted to PAP Companies: [employer], via fax

## 2023-01-21 DIAGNOSIS — M5416 Radiculopathy, lumbar region: Secondary | ICD-10-CM | POA: Diagnosis not present

## 2023-01-23 NOTE — Telephone Encounter (Signed)
 Pt. Called and said she got a letter from [employer] asking to do a credit check, she is declining and is no longer wanting to pursue patient assistance

## 2023-02-17 DIAGNOSIS — M5416 Radiculopathy, lumbar region: Secondary | ICD-10-CM | POA: Diagnosis not present

## 2023-03-10 ENCOUNTER — Other Ambulatory Visit: Payer: Self-pay | Admitting: *Deleted

## 2023-03-10 ENCOUNTER — Encounter: Payer: Self-pay | Admitting: Family Medicine

## 2023-03-10 ENCOUNTER — Other Ambulatory Visit: Payer: Self-pay | Admitting: Family Medicine

## 2023-03-10 MED ORDER — BACLOFEN 10 MG PO TABS: 10.0000 mg | ORAL_TABLET | Freq: Three times a day (TID) | ORAL | 0 refills | Status: DC

## 2023-03-10 NOTE — Patient Instructions (Signed)
 Visit Information  Thank you for taking time to visit with me today. Please don't hesitate to contact me if I can be of assistance to you before our next scheduled telephone appointment.  Following are the goals we discussed today:   Goals Addressed             This Visit's Progress    CCM Expected Outcome:  Monitor, Self-Manage, and Reduce Symptoms of Hypertension       Current Barriers:  Chronic Disease Management support and education needs related to effective management of HTN Financial Constraints.  BP Readings from Last 3 Encounters:  11/18/22 130/80  10/21/22 124/64  07/15/22 132/74     Planned Interventions: Evaluation of current treatment plan related to hypertension self management and patient's adherence to plan as established by provider. The patient states her blood pressures have been stable.Reports most recent reading of 127/73. Denies any chest pain, swelling, dizziness or headaches at this time. Provided education to patient re: stroke prevention, s/s of heart attack and stroke; Education and support provided Reviewed prescribed diet heart healthy/ADA diet. Review and education. The patient is compliant with dietary restrictions Reviewed medications with patient and discussed importance of compliance. Patient is compliant with medications.   Discussed plans with patient for ongoing care management follow up and provided patient with direct contact information for care management team; Reviewed scheduled/upcoming provider appointments including: 05-05-2023 w/ PCP Advised patient to discuss changes in blood pressures or heart health with provider; Provided education on prescribed diet heart healthy/ADA;  Discussed complications of poorly controlled blood pressure such as heart disease, stroke, circulatory complications, vision complications, kidney impairment, sexual dysfunction;  Screening for signs and symptoms of depression related to chronic disease state;   Assessed social determinant of health barriers;  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  Reports that her new insurance will no longer cover her Zanaflex and they will only cover baclofen. RNCM to sent PCP a message as patient will run out of refills prior to her scheduled appointment.  Symptom Management: Take medications as prescribed   Attend all scheduled provider appointments Call pharmacy for medication refills 3-7 days in advance of running out of medications Call provider office for new concerns or questions  call the Suicide and Crisis Lifeline: 988 call the [place] National Suicide Prevention Lifeline: 938 778 7647 or TTY: 201-433-2277 TTY (671)420-8009) to talk to a trained counselor call 1-800-273-TALK (toll free, 24 hour hotline) if experiencing a Mental Health or Behavioral Health Crisis  learn about high blood pressure call doctor for signs and symptoms of high blood pressure develop an action plan for high blood pressure keep all doctor appointments take medications for blood pressure exactly as prescribed report new symptoms to your doctor  Follow Up Plan: Telephone follow up appointment with care management team member scheduled for: 05-12-2023 at 9:00  am       RNCM Care Management Expected Outcome:  Monitor, Self-Manage and Reduce Symptoms of Diabetes       Current Barriers:  Care Coordination needs related to cost of Victoza and current pap in a patient with DM Chronic Disease Management support and education needs related to effective management of DM Financial Constraints.  Lab Results  Component Value Date   HGBA1C 6.0 (H) 10/14/2022     Planned Interventions: Provided education to patient about basic DM disease process. Denies any new concerns related to DM. Feels like she is doing well. Does not check blood  sugar and states she manages her DM with her diet. Reviewed medications with patient and discussed importance of  medication adherence. Patient states she is compliant with her Victoza but will likely not be taking in in 2025 due to some new policy changes with the manufacturer. She states they are now requiring a credit check for patient assistance and she is not in agreement with the change. Reviewed prescribed diet with patient heart healthy/ADA diet. Reports compliance with diet Counseled on importance of regular laboratory monitoring as prescribed. Has labs on a regular basis.  Discussed plans with patient for ongoing care management follow up and provided patient with direct contact information for care management team;      Provided patient with written educational materials related to hypo and hyperglycemia and importance of correct treatment. Denies any sx and sx of hypo or hyperglycemias;       Reviewed scheduled/upcoming provider appointments including:PCP 05-05-2023        Advised patient, providing education and rationale, to check cbg when you have symptoms of low or high blood sugar and record. The patient does not check her blood sugars daily and states her MD is aware call provider for findings outside established parameters;       Referral made to pharmacy team for assistance with medication questions and assistance with PAP for Victoza.  Review of patient status, including review of consultants reports, relevant laboratory and other test results, and medications completed;       Advised patient to discuss changes in DM or chronic conditions with provider;      Screening for signs and symptoms of depression related to chronic disease state;        Assessed social determinant of health barriers;        Eye exam scheduled for September 2025 Symptom Management: Take medications as prescribed   Attend all scheduled provider appointments Call pharmacy for medication refills 3-7 days in advance of running out of medications Call provider office for new concerns or questions  call the Suicide and  Crisis Lifeline: 988 call the [place] National Suicide Prevention Lifeline: 321-280-5191 or TTY: (678)425-7165 TTY 709-133-5160) to talk to a trained counselor call 1-800-273-TALK (toll free, 24 hour hotline) if experiencing a Mental Health or Behavioral Health Crisis  check blood sugar at prescribed times: when you have symptoms of low or high blood sugar check feet daily for cuts, sores or redness trim toenails straight across wash and dry feet carefully every day wear comfortable, cotton socks wear comfortable, well-fitting shoes  Follow Up Plan: Telephone follow up appointment with care management team member scheduled for: 05-12-2023 at 9:00 am           Our next appointment is by telephone on 05-12-2023 at 9:00 am  Please call the care guide team at [phone] if you need to cancel or reschedule your appointment.   If you are experiencing a Mental Health or Behavioral Health Crisis or need someone to talk to, please call the Suicide and Crisis Lifeline: 988 call the [place] National Suicide Prevention Lifeline: 564-019-6894 or TTY: 782-765-1470 TTY (367) 717-4871) to talk to a trained counselor call 1-800-273-TALK (toll free, 24 hour hotline)   Patient verbalizes understanding of instructions and care plan provided today and agrees to view in MyChart. Active MyChart status and patient understanding of how to access instructions and care plan via MyChart confirmed with patient.     Telephone follow up appointment with care management team member scheduled for:05-12-2023 at 9:00 am  Larey Brick, BSN RN [facility], [facility] RN Care Manager Direct Dial: [phone]  Fax: [phone]

## 2023-03-10 NOTE — Patient Outreach (Signed)
 Care Management   Visit Note  03/10/2023 Name: Anna Greene MRN: [id number] DOB: 1955/04/14  Subjective: Anna Greene is a 68 y.o. year old female who is a primary care patient of Cox, Kirsten, MD. The Care Management team was consulted for assistance.      Engaged with patient spoke with patient by telephone.    Goals Addressed             This Visit's Progress    CCM Expected Outcome:  Monitor, Self-Manage, and Reduce Symptoms of Hypertension       Current Barriers:  Chronic Disease Management support and education needs related to effective management of HTN Financial Constraints.  BP Readings from Last 3 Encounters:  11/18/22 130/80  10/21/22 124/64  07/15/22 132/74     Planned Interventions: Evaluation of current treatment plan related to hypertension self management and patient's adherence to plan as established by provider. The patient states her blood pressures have been stable.Reports most recent reading of 127/73. Denies any chest pain, swelling, dizziness or headaches at this time. Provided education to patient re: stroke prevention, s/s of heart attack and stroke; Education and support provided Reviewed prescribed diet heart healthy/ADA diet. Review and education. The patient is compliant with dietary restrictions Reviewed medications with patient and discussed importance of compliance. Patient is compliant with medications.   Discussed plans with patient for ongoing care management follow up and provided patient with direct contact information for care management team; Reviewed scheduled/upcoming provider appointments including: 05-05-2023 w/ PCP Advised patient to discuss changes in blood pressures or heart health with provider; Provided education on prescribed diet heart healthy/ADA;  Discussed complications of poorly controlled blood pressure such as heart disease, stroke, circulatory complications, vision complications, kidney impairment, sexual dysfunction;   Screening for signs and symptoms of depression related to chronic disease state;  Assessed social determinant of health barriers;  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  Reports that her new insurance will no longer cover her Zanaflex and they will only cover baclofen. RNCM to sent PCP a message as patient will run out of refills prior to her scheduled appointment.  Symptom Management: Take medications as prescribed   Attend all scheduled provider appointments Call pharmacy for medication refills 3-7 days in advance of running out of medications Call provider office for new concerns or questions  call the Suicide and Crisis Lifeline: 988 call the [place] National Suicide Prevention Lifeline: (603) 617-6342 or TTY: 610-530-9414 TTY 930 456 6080) to talk to a trained counselor call 1-800-273-TALK (toll free, 24 hour hotline) if experiencing a Mental Health or Behavioral Health Crisis  learn about high blood pressure call doctor for signs and symptoms of high blood pressure develop an action plan for high blood pressure keep all doctor appointments take medications for blood pressure exactly as prescribed report new symptoms to your doctor  Follow Up Plan: Telephone follow up appointment with care management team member scheduled for: 05-12-2023 at 9:00  am       RNCM Care Management Expected Outcome:  Monitor, Self-Manage and Reduce Symptoms of Diabetes       Current Barriers:  Care Coordination needs related to cost of Victoza and current pap in a patient with DM Chronic Disease Management support and education needs related to effective management of DM Financial Constraints.  Lab Results  Component Value Date   HGBA1C 6.0 (H) 10/14/2022     Planned Interventions: Provided education to patient about basic DM disease process.  Denies any new concerns related to DM. Feels like she is doing well. Does not check blood sugar and states she manages her DM  with her diet. Reviewed medications with patient and discussed importance of medication adherence. Patient states she is compliant with her Victoza but will likely not be taking in in 2025 due to some new policy changes with the manufacturer. She states they are now requiring a credit check for patient assistance and she is not in agreement with the change. Reviewed prescribed diet with patient heart healthy/ADA diet. Reports compliance with diet Counseled on importance of regular laboratory monitoring as prescribed. Has labs on a regular basis.  Discussed plans with patient for ongoing care management follow up and provided patient with direct contact information for care management team;      Provided patient with written educational materials related to hypo and hyperglycemia and importance of correct treatment. Denies any sx and sx of hypo or hyperglycemias;       Reviewed scheduled/upcoming provider appointments including:PCP 05-05-2023        Advised patient, providing education and rationale, to check cbg when you have symptoms of low or high blood sugar and record. The patient does not check her blood sugars daily and states her MD is aware call provider for findings outside established parameters;       Referral made to pharmacy team for assistance with medication questions and assistance with PAP for Victoza.  Review of patient status, including review of consultants reports, relevant laboratory and other test results, and medications completed;       Advised patient to discuss changes in DM or chronic conditions with provider;      Screening for signs and symptoms of depression related to chronic disease state;        Assessed social determinant of health barriers;        Eye exam scheduled for September 2025 Symptom Management: Take medications as prescribed   Attend all scheduled provider appointments Call pharmacy for medication refills 3-7 days in advance of running out of  medications Call provider office for new concerns or questions  call the Suicide and Crisis Lifeline: 988 call the [place] National Suicide Prevention Lifeline: 902-064-8532 or TTY: 878-402-3639 TTY (606) 380-7094) to talk to a trained counselor call 1-800-273-TALK (toll free, 24 hour hotline) if experiencing a Mental Health or Behavioral Health Crisis  check blood sugar at prescribed times: when you have symptoms of low or high blood sugar check feet daily for cuts, sores or redness trim toenails straight across wash and dry feet carefully every day wear comfortable, cotton socks wear comfortable, well-fitting shoes  Follow Up Plan: Telephone follow up appointment with care management team member scheduled for: 05-12-2023 at 9:00 am           Consent to Services:  Patient was given information about care management services, agreed to services, and gave verbal consent to participate.   Plan: Telephone follow up appointment with care management team member scheduled for:05-12-2023 at 9:00 am  Larey Brick, BSN RN [facility], [facility] RN Care Manager Direct Dial: [phone]  Fax: [phone]

## 2023-03-11 ENCOUNTER — Other Ambulatory Visit: Payer: Self-pay | Admitting: Family Medicine

## 2023-03-11 DIAGNOSIS — Z1231 Encounter for screening mammogram for malignant neoplasm of breast: Secondary | ICD-10-CM

## 2023-04-01 ENCOUNTER — Other Ambulatory Visit: Payer: Self-pay | Admitting: Family Medicine

## 2023-04-01 DIAGNOSIS — I152 Hypertension secondary to endocrine disorders: Secondary | ICD-10-CM

## 2023-04-01 DIAGNOSIS — M51369 Other intervertebral disc degeneration, lumbar region without mention of lumbar back pain or lower extremity pain: Secondary | ICD-10-CM

## 2023-04-06 ENCOUNTER — Other Ambulatory Visit: Payer: Self-pay | Admitting: Family Medicine

## 2023-04-06 DIAGNOSIS — M51369 Other intervertebral disc degeneration, lumbar region without mention of lumbar back pain or lower extremity pain: Secondary | ICD-10-CM

## 2023-04-06 DIAGNOSIS — I152 Hypertension secondary to endocrine disorders: Secondary | ICD-10-CM

## 2023-04-17 ENCOUNTER — Other Ambulatory Visit: Payer: Self-pay | Admitting: Family Medicine

## 2023-04-17 DIAGNOSIS — F411 Generalized anxiety disorder: Secondary | ICD-10-CM

## 2023-04-28 ENCOUNTER — Other Ambulatory Visit: Payer: Self-pay | Admitting: Family Medicine

## 2023-04-28 ENCOUNTER — Other Ambulatory Visit: Payer: Medicare HMO

## 2023-04-28 DIAGNOSIS — E1169 Type 2 diabetes mellitus with other specified complication: Secondary | ICD-10-CM

## 2023-04-28 DIAGNOSIS — K080 Exfoliation of teeth due to systemic causes: Secondary | ICD-10-CM | POA: Diagnosis not present

## 2023-04-28 DIAGNOSIS — I10 Essential (primary) hypertension: Secondary | ICD-10-CM | POA: Diagnosis not present

## 2023-04-28 DIAGNOSIS — E782 Mixed hyperlipidemia: Secondary | ICD-10-CM | POA: Diagnosis not present

## 2023-04-28 DIAGNOSIS — Z792 Long term (current) use of antibiotics: Secondary | ICD-10-CM

## 2023-04-29 ENCOUNTER — Telehealth: Payer: Self-pay

## 2023-04-29 LAB — HEMOGLOBIN A1C
Est. average glucose Bld gHb Est-mCnc: 126 mg/dL
Hgb A1c MFr Bld: 6 % — ABNORMAL HIGH (ref 4.8–5.6)

## 2023-04-29 LAB — LIPID PANEL
Chol/HDL Ratio: 2.5 ratio (ref 0.0–4.4)
Cholesterol, Total: 124 mg/dL (ref 100–199)
HDL: 49 mg/dL (ref >39)
LDL Chol Calc (NIH): 50 mg/dL (ref 0–99)
Triglycerides: 146 mg/dL (ref 0–149)
VLDL Cholesterol Cal: 25 mg/dL (ref 5–40)

## 2023-04-29 LAB — CBC WITH DIFFERENTIAL/PLATELET
Basophils Absolute: 0.1 10*3/uL (ref 0.0–0.2)
Basos: 1 %
EOS (ABSOLUTE): 0.4 10*3/uL (ref 0.0–0.4)
Eos: 7 %
Hematocrit: 40.3 % (ref 34.0–46.6)
Hemoglobin: 12.9 g/dL (ref 11.1–15.9)
Immature Grans (Abs): 0 10*3/uL (ref 0.0–0.1)
Immature Granulocytes: 0 %
Lymphocytes Absolute: 2.4 10*3/uL (ref 0.7–3.1)
Lymphs: 41 %
MCH: 28.2 pg (ref 26.6–33.0)
MCHC: 32 g/dL (ref 31.5–35.7)
MCV: 88 fL (ref 79–97)
Monocytes Absolute: 0.5 10*3/uL (ref 0.1–0.9)
Monocytes: 9 %
Neutrophils Absolute: 2.4 10*3/uL (ref 1.4–7.0)
Neutrophils: 42 %
Platelets: 290 10*3/uL (ref 150–450)
RBC: 4.58 x10E6/uL (ref 3.77–5.28)
RDW: 13.1 % (ref 11.7–15.4)
WBC: 5.8 10*3/uL (ref 3.4–10.8)

## 2023-04-29 LAB — COMPREHENSIVE METABOLIC PANEL WITH GFR
ALT: 21 IU/L (ref 0–32)
AST: 21 IU/L (ref 0–40)
Albumin: 4.1 g/dL (ref 3.9–4.9)
Alkaline Phosphatase: 88 IU/L (ref 44–121)
BUN/Creatinine Ratio: 30 — ABNORMAL HIGH (ref 12–28)
BUN: 28 mg/dL — ABNORMAL HIGH (ref 8–27)
Bilirubin Total: 0.4 mg/dL (ref 0.0–1.2)
CO2: 25 mmol/L (ref 20–29)
Calcium: 10 mg/dL (ref 8.7–10.3)
Chloride: 101 mmol/L (ref 96–106)
Creatinine, Ser: 0.94 mg/dL (ref 0.57–1.00)
Globulin, Total: 2.3 g/dL (ref 1.5–4.5)
Glucose: 113 mg/dL — ABNORMAL HIGH (ref 70–99)
Potassium: 4.4 mmol/L (ref 3.5–5.2)
Sodium: 141 mmol/L (ref 134–144)
Total Protein: 6.4 g/dL (ref 6.0–8.5)
eGFR: 66 mL/min/{1.73_m2} (ref >59)

## 2023-04-29 LAB — MICROALBUMIN / CREATININE URINE RATIO
Creatinine, Urine: 30.2 mg/dL
Microalb/Creat Ratio: <10 mg/g{creat} (ref 0–29)
Microalbumin, Urine: <3 ug/mL

## 2023-04-29 NOTE — Telephone Encounter (Signed)
 I left a message on the number(s) listed in the patients chart requesting the patient to call back regarding the upcomming appointment for 05/05/2023. The provider is out of the office that day. The appointment has been canceled. Waiting for the patient to return the call.  On the voicemail I stated that when the patient calls back to speak with Germain Kohler at th office. The appointment can be reschedule to 04/17 or 4/24 in the afternoon only with Dr. Reinhold Carbine. If I am unavailable then the patient speak with another front staff member to get this appointment rescheduled.

## 2023-04-29 NOTE — Telephone Encounter (Signed)
 Appointment has been rescheduled.

## 2023-04-30 ENCOUNTER — Encounter: Payer: Self-pay | Admitting: Family Medicine

## 2023-04-30 NOTE — Progress Notes (Addendum)
 Subjective:  Patient ID: Anna Greene, female    DOB: [birth year]  Age: 68 y.o. MRN: [id number]  Chief Complaint  Patient presents with   Medical Management of Chronic Issues   HPI:  Diabetes:  Complications: Hyperlipidemia, hypertension Glucose checking: No Glucose logs: No Hypoglycemia: She is not sure Most recent A1C: 6.0% Current medications: Victoza  1.8 mg daily. Tried ozempic  which helped, but changed to victoza  for patient assistance.  Foot checks: Daily Eats healthy and tries to exercise.  Hyperlipidemia: Current medications: Rosuvastatin  5 mg daily. AT GOAL  Hypertension: Complications: Diabetes Current medications: Atenolol -chlorthalidone  100-25 mg daily, Losartan  50 mg daily.   GAD: Taking klonopin  0.5 mg daily PRN.   GERD: Omeprazole  20 mg once daily.  OA Back: on baclofen  three times a day and celebrex  twice daily. Gabapentin  300 mg three times a day.   Allergic rhinitis: on zyrtec .        10/21/2022   10:51 AM 07/15/2022   10:32 AM 02/04/2022   10:39 AM 10/01/2021    1:32 PM 05/28/2021    2:31 PM  Depression screen PHQ 2/9  Decreased Interest 0 0 0 0 0  Down, Depressed, Hopeless 0 0 0 0 0  PHQ - 2 Score 0 0 0 0 0  Altered sleeping 1 0  0 2  Tired, decreased energy 0 0  0 0  Change in appetite 0 0  0 0  Feeling bad or failure about yourself  0 0  0 0  Trouble concentrating 0 0  0 0  Moving slowly or fidgety/restless 0 0  0 0  Suicidal thoughts 0 0  0 0  PHQ-9 Score 1 0  0 2  Difficult doing work/chores Not difficult at all Not difficult at all  Not difficult at all         11/18/2022    8:18 AM  Fall Risk   Falls in the past year? 0  Number falls in past yr: 0  Injury with Fall? 0  Risk for fall due to : No Fall Risks  Follow up Falls evaluation completed    Patient Care Team: Mercy Stall, MD as PCP - General (Family Medicine) Wendolyn Hamburger, MD as Consulting Physician (Orthopedic Surgery) [place] At[place] Scot Cutter, MD as Consulting Physician  (Ophthalmology)   Review of Systems  Constitutional:  Negative for chills, fatigue and fever.  HENT:  Negative for congestion, ear pain, rhinorrhea and sore throat.   Respiratory:  Negative for cough and shortness of breath.   Cardiovascular:  Negative for chest pain.  Gastrointestinal:  Negative for abdominal pain, constipation, diarrhea, nausea and vomiting.  Genitourinary:  Negative for dysuria and urgency.  Musculoskeletal:  Negative for back pain and myalgias.  Neurological:  Negative for dizziness, weakness, light-headedness and headaches.  Psychiatric/Behavioral:  Negative for dysphoric mood. The patient is not nervous/anxious.     Current Outpatient Medications on File Prior to Visit  Medication Sig Dispense Refill   amoxicillin  (AMOXIL ) 500 MG capsule TAKE FOUR CAPSULES BY MOUTH ONE HOUR BEFORE DENTAL PROCEDURE 20 capsule 0   atenolol -chlorthalidone  (TENORETIC ) 100-25 MG tablet Take 1 tablet by mouth once daily 90 tablet 0   baclofen  (LIORESAL ) 10 MG tablet Take 1 tablet (10 mg total) by mouth 3 (three) times daily. 270 each 0   celecoxib  (CELEBREX ) 200 MG capsule Take 1 capsule by mouth twice daily 180 capsule 0   cetirizine  (ZYRTEC ) 10 MG tablet Take 1 tablet (10 mg total) by mouth daily. 90 tablet 3  clonazePAM  (KLONOPIN ) 1 MG tablet TAKE 1 TABLET BY MOUTH AT BEDTIME AS NEEDED FOR ANXIETY 30 tablet 0   gabapentin  (NEURONTIN ) 300 MG capsule Take 1 capsule (300 mg total) by mouth 3 (three) times daily. 270 capsule 3   liraglutide  (VICTOZA ) 18 MG/3ML SOPN Inject 1.8 mg into the skin daily. 3 mL 1   losartan  (COZAAR ) 50 MG tablet Take 1 tablet (50 mg total) by mouth daily. 90 tablet 1   omega-3 acid ethyl esters (LOVAZA ) 1 g capsule Take 2 capsules (2 g total) by mouth 2 (two) times daily. 360 capsule 0   omeprazole  (PRILOSEC) 20 MG capsule Take 1 capsule (20 mg total) by mouth every morning. 90 capsule 1   potassium chloride  (KLOR-CON ) 10 MEQ tablet Take 1 tablet (10 mEq total)  by mouth every morning. 90 tablet 1   rosuvastatin  (CRESTOR ) 5 MG tablet Take 1 tablet (5 mg total) by mouth daily. 90 tablet 1   No current facility-administered medications on file prior to visit.   Past Medical History:  Diagnosis Date   Benign fasciculation-cramp syndrome    Depression    Diabetes mellitus without complication (HCC)    GERD (gastroesophageal reflux disease)    Hyperlipidemia    Hypertension    Nephrolithiasis    Osteoarthritis    Bilateral knees, degenerative disc disease.  Patient has had epidural steroid injections x2   Plantar fasciitis    Past Surgical History:  Procedure Laterality Date   BREAST REDUCTION SURGERY Bilateral 1996   CESAREAN SECTION  1976   CESAREAN SECTION  1979   KNEE ARTHROSCOPY W/ MENISCAL REPAIR Left 1995   Per patient muscle was repaired   KNEE ARTHROSCOPY W/ MENISCAL REPAIR Right 2003   knee surgery Left 10/2020   meniscal repair via arthroscopy   PANNICULECTOMY     REDUCTION MAMMAPLASTY  1996   REPLACEMENT TOTAL KNEE Right 2019   TONSILLECTOMY  1960    Family History  Problem Relation Age of Onset   Stroke Mother    Heart disease Mother    Cancer Father    Diabetes Sister    Anxiety disorder Sister    Depression Sister    Diabetes Sister    Diabetes Sister    Arthritis Sister    Stroke Brother    Breast cancer Neg Hx    Social History   Socioeconomic History   Marital status: Married    Spouse name: Ronnie   Number of children: 2   Years of education: Not on file   Highest education level: 12th grade  Occupational History   Occupation: TEFL teacher   Occupation: Retired Teacher, music  Tobacco Use   Smoking status: Never   Smokeless tobacco: Never  Vaping Use   Vaping status: Never Used  Substance and Sexual Activity   Alcohol use: Never   Drug use: Never   Sexual activity: Yes    Partners: Male  Other Topics Concern   Not on file  Social History Narrative   Patient is married.  Has 2 adult  children that live in [place]  (where she is from)   Social Drivers of Health   Financial Resource Strain: Low Risk  (04/30/2023)   Overall Financial Resource Strain (CARDIA)    Difficulty of Paying Living Expenses: Not hard at all  Food Insecurity: No Food Insecurity (04/30/2023)   Hunger Vital Sign    Worried About Running Out of Food in the Last Year: Never true    Ran Out  of Food in the Last Year: Never true  Transportation Needs: No Transportation Needs (04/30/2023)   PRAPARE - Administrator, Civil Service (Medical): No    Lack of Transportation (Non-Medical): No  Physical Activity: Insufficiently Active (04/30/2023)   Exercise Vital Sign    Days of Exercise per Week: 3 days    Minutes of Exercise per Session: 30 min  Stress: No Stress Concern Present (04/30/2023)   [employer] of Occupational Health - Occupational Stress Questionnaire    Feeling of Stress : Not at all  Social Connections: Moderately Isolated (04/30/2023)   Social Connection and Isolation Panel [NHANES]    Frequency of Communication with Friends and Family: More than three times a week    Frequency of Social Gatherings with Friends and Family: Once a week    Attends Religious Services: Never    Database administrator or Organizations: No    Attends Engineer, structural: Never    Marital Status: Married    Objective:  BP 120/72   Pulse 68   Temp 98 F (36.7 C)   Ht 5\' 6"  (1.676 m)   LMP  (LMP Unknown)   SpO2 97%   BMI 32.93 kg/m      05/01/2023    2:21 PM 11/18/2022    7:55 AM 10/21/2022   10:48 AM  BP/Weight  Systolic BP 120 130 124  Diastolic BP 72 80 64  Wt. (Lbs)  204 201.6  BMI  32.93 kg/m2 32.54 kg/m2    Physical Exam Vitals reviewed.  Constitutional:      Appearance: Normal appearance. She is normal weight.  Neck:     Vascular: No carotid bruit.  Cardiovascular:     Rate and Rhythm: Normal rate and regular rhythm.     Heart sounds: Normal heart sounds.   Pulmonary:     Effort: Pulmonary effort is normal. No respiratory distress.     Breath sounds: Normal breath sounds.  Abdominal:     General: Abdomen is flat. Bowel sounds are normal.     Palpations: Abdomen is soft.     Tenderness: There is no abdominal tenderness.  Musculoskeletal:        General: Tenderness (anterior shoulder. Discomfort with ROM.) present.  Neurological:     Mental Status: She is alert and oriented to person, place, and time.  Psychiatric:        Mood and Affect: Mood normal.        Behavior: Behavior normal.     Diabetic Foot Exam - Simple   Simple Foot Form Diabetic Foot exam was performed with the following findings: Yes 05/01/2023  2:48 PM  Visual Inspection No deformities, no ulcerations, no other skin breakdown bilaterally: Yes Sensation Testing Intact to touch and monofilament testing bilaterally: Yes Pulse Check Posterior Tibialis and Dorsalis pulse intact bilaterally: Yes Comments      Joint Injection/Arthrocentesis  Date/Time: 05/01/2023 10:08 PM  Performed by: Mercy Stall, MD Authorized by: Mercy Stall, MD  Indications: pain  Body area: shoulder Joint: right shoulder Local anesthesia used: yes  Anesthesia: Local anesthesia used: yes Local Anesthetic: topical anesthetic  Sedation: Patient sedated: no  Needle size: 22 G Ultrasound guidance: no Approach: posterior Triamcinolone  amount: 40 mg Lidocaine 1% amount: 5 mL Patient tolerance: patient tolerated the procedure well with no immediate complications      Lab Results  Component Value Date   WBC 5.8 04/28/2023   HGB 12.9 04/28/2023   HCT 40.3 04/28/2023  PLT 290 04/28/2023   GLUCOSE 113 (H) 04/28/2023   CHOL 124 04/28/2023   TRIG 146 04/28/2023   HDL 49 04/28/2023   LDLCALC 50 04/28/2023   ALT 21 04/28/2023   AST 21 04/28/2023   NA 141 04/28/2023   K 4.4 04/28/2023   CL 101 04/28/2023   CREATININE 0.94 04/28/2023   BUN 28 (H) 04/28/2023   CO2 25 04/28/2023    TSH 0.948 01/28/2022   HGBA1C 6.0 (H) 04/28/2023      Assessment & Plan:   Essential hypertension, benign Assessment & Plan: Well controlled.  No changes to medicines. Atenolol -chlorthalidone  100-25 mg daily, Losartan  50 mg daily.  Continue to work on eating a healthy diet and exercise.  Labs drawn today.    Gastroesophageal reflux disease without esophagitis Assessment & Plan: The current medical regimen is effective;  continue present plan and medications.  Omeprazole  20 mg once daily.      Type 2 diabetes mellitus without complication, without long-term current use of insulin  (HCC) Assessment & Plan: Control: Good Recommend check sugars fasting daily. Recommend check feet daily. Recommend annual eye exams. Medicines: Recommend gradually wean off Victoza  1.8 mg daily.  Continue to work on eating a healthy diet and exercise.  Labs drawn today.      Mixed hyperlipidemia Assessment & Plan: Well controlled.  No changes to medicines. Rosuvastatin  5 mg daily.  Continue to work on eating a healthy diet and exercise.     Localized osteoarthritis of right shoulder Assessment & Plan: Arthrocentesis completed.   Orders: -     Arthrocentesis  Mixed anxiety and depressive disorder Assessment & Plan: Taking klonopin  0.5 mg daily PRN.      No orders of the defined types were placed in this encounter.   Orders Placed This Encounter  Procedures   Joint Injection/Arthrocentesis     Follow-up: Return in about 3 months (around 07/31/2023) for chronic follow up.   I,Marla I Leal-Borjas,acting as a scribe for Mercy Stall, MD.,have documented all relevant documentation on the behalf of Mercy Stall, MD,as directed by  Mercy Stall, MD while in the presence of Mercy Stall, MD.   An After Visit Summary was printed and given to the patient.  I attest that I have reviewed this visit and agree with the plan scribed by my staff.   Mercy Stall, MD [facility] [phone]

## 2023-05-01 ENCOUNTER — Ambulatory Visit (INDEPENDENT_AMBULATORY_CARE_PROVIDER_SITE_OTHER): Admitting: Family Medicine

## 2023-05-01 ENCOUNTER — Encounter: Payer: Self-pay | Admitting: Family Medicine

## 2023-05-01 VITALS — BP 120/72 | HR 68 | Temp 98.0°F | Ht 66.0 in

## 2023-05-01 DIAGNOSIS — K219 Gastro-esophageal reflux disease without esophagitis: Secondary | ICD-10-CM

## 2023-05-01 DIAGNOSIS — M19011 Primary osteoarthritis, right shoulder: Secondary | ICD-10-CM

## 2023-05-01 DIAGNOSIS — E782 Mixed hyperlipidemia: Secondary | ICD-10-CM

## 2023-05-01 DIAGNOSIS — I152 Hypertension secondary to endocrine disorders: Secondary | ICD-10-CM

## 2023-05-01 DIAGNOSIS — I10 Essential (primary) hypertension: Secondary | ICD-10-CM | POA: Diagnosis not present

## 2023-05-01 DIAGNOSIS — E119 Type 2 diabetes mellitus without complications: Secondary | ICD-10-CM | POA: Diagnosis not present

## 2023-05-01 DIAGNOSIS — E1169 Type 2 diabetes mellitus with other specified complication: Secondary | ICD-10-CM

## 2023-05-01 DIAGNOSIS — F418 Other specified anxiety disorders: Secondary | ICD-10-CM

## 2023-05-04 NOTE — Assessment & Plan Note (Signed)
 Taking klonopin  0.5 mg daily PRN.

## 2023-05-04 NOTE — Assessment & Plan Note (Signed)
Well controlled.  No changes to medicines. Rosuvastatin 5 mg daily. Continue to work on eating a healthy diet and exercise.

## 2023-05-04 NOTE — Assessment & Plan Note (Addendum)
 Control: Good Recommend check sugars fasting daily. Recommend check feet daily. Recommend annual eye exams. Medicines: Recommend gradually wean off Victoza  1.8 mg daily.  Continue to work on eating a healthy diet and exercise.  Labs drawn today.

## 2023-05-04 NOTE — Assessment & Plan Note (Signed)
The current medical regimen is effective;  continue present plan and medications.  Omeprazole 20 mg once daily.

## 2023-05-04 NOTE — Assessment & Plan Note (Signed)
Well controlled.  No changes to medicines. Atenolol-chlorthalidone 100-25 mg daily, Losartan 50 mg daily.  Continue to work on eating a healthy diet and exercise.  Labs drawn today.

## 2023-05-04 NOTE — Assessment & Plan Note (Signed)
 Arthrocentesis completed.

## 2023-05-05 ENCOUNTER — Ambulatory Visit: Payer: Medicare HMO | Admitting: Family Medicine

## 2023-05-07 ENCOUNTER — Inpatient Hospital Stay: Admission: RE | Admit: 2023-05-07 | Payer: Medicare Other | Source: Ambulatory Visit

## 2023-05-12 ENCOUNTER — Other Ambulatory Visit: Payer: Medicare Other | Admitting: *Deleted

## 2023-05-20 ENCOUNTER — Other Ambulatory Visit: Payer: Self-pay

## 2023-05-20 DIAGNOSIS — F411 Generalized anxiety disorder: Secondary | ICD-10-CM

## 2023-05-21 ENCOUNTER — Telehealth: Payer: Self-pay

## 2023-05-21 ENCOUNTER — Encounter (HOSPITAL_COMMUNITY): Payer: Self-pay

## 2023-05-21 NOTE — Telephone Encounter (Addendum)
 Contacted Anna Greene to schedule their annual wellness visit. Call back at later date: Left message for patient to call office and schedule appointment after 11/18/23

## 2023-05-26 ENCOUNTER — Encounter: Payer: Self-pay | Admitting: *Deleted

## 2023-05-26 ENCOUNTER — Other Ambulatory Visit: Payer: Self-pay | Admitting: *Deleted

## 2023-05-26 NOTE — Progress Notes (Signed)
 [name]   Please reschedule missed follow up with [name] for 60 min.  Thank you  [name]  [facility]  [facility], [facility] Guide  Direct Dial: [phone]  Fax [phone]

## 2023-05-28 ENCOUNTER — Ambulatory Visit
Admission: RE | Admit: 2023-05-28 | Discharge: 2023-05-28 | Disposition: A | Discharge status: Home / Self Care | Source: Ambulatory Visit | Attending: Family Medicine | Admitting: Family Medicine

## 2023-05-28 DIAGNOSIS — Z1231 Encounter for screening mammogram for malignant neoplasm of breast: Secondary | ICD-10-CM

## 2023-06-02 ENCOUNTER — Telehealth: Payer: Self-pay

## 2023-06-02 NOTE — Progress Notes (Signed)
 Complex Care Management Note Care Guide Note  06/02/2023 Name: Anna Greene MRN: [id number] DOB: 01/02/1956   Complex Care Management Outreach Attempts: An unsuccessful telephone outreach was attempted today to offer the patient information about available complex care management services.  Follow Up Plan:  Additional outreach attempts will be made to offer the patient complex care management information and services.   Encounter Outcome:  No Answer  [facility]  [facility], [facility] Care Management Assistant Direct Dial: [phone]  Fax: [phone]

## 2023-06-03 ENCOUNTER — Ambulatory Visit: Payer: Self-pay | Admitting: Family Medicine

## 2023-06-05 ENCOUNTER — Other Ambulatory Visit: Payer: Self-pay | Admitting: Family Medicine

## 2023-06-05 NOTE — Progress Notes (Signed)
 Complex Care Management Note Care Guide Note  06/05/2023 Name: Anna Greene MRN: [id number] DOB: Feb 02, 1955   Complex Care Management Outreach Attempts: A second unsuccessful outreach was attempted today to offer the patient with information about available complex care management services.  Follow Up Plan:  No further outreach attempts will be made at this time. We have been unable to contact the patient to offer or enroll patient in complex care management services.  Encounter Outcome:  No Answer  [facility]  [facility], [facility] Care Management Assistant Direct Dial: [phone]  Fax: [phone]

## 2023-06-23 DIAGNOSIS — M5416 Radiculopathy, lumbar region: Secondary | ICD-10-CM | POA: Diagnosis not present

## 2023-06-30 DIAGNOSIS — M545 Low back pain, unspecified: Secondary | ICD-10-CM | POA: Diagnosis not present

## 2023-07-02 ENCOUNTER — Other Ambulatory Visit: Payer: Self-pay | Admitting: Family Medicine

## 2023-07-02 ENCOUNTER — Other Ambulatory Visit: Payer: Self-pay

## 2023-07-02 DIAGNOSIS — I152 Hypertension secondary to endocrine disorders: Secondary | ICD-10-CM

## 2023-07-02 DIAGNOSIS — E782 Mixed hyperlipidemia: Secondary | ICD-10-CM

## 2023-07-02 DIAGNOSIS — M51369 Other intervertebral disc degeneration, lumbar region without mention of lumbar back pain or lower extremity pain: Secondary | ICD-10-CM

## 2023-07-02 DIAGNOSIS — K219 Gastro-esophageal reflux disease without esophagitis: Secondary | ICD-10-CM

## 2023-07-14 DIAGNOSIS — M5416 Radiculopathy, lumbar region: Secondary | ICD-10-CM | POA: Diagnosis not present

## 2023-07-29 DIAGNOSIS — M5416 Radiculopathy, lumbar region: Secondary | ICD-10-CM | POA: Diagnosis not present

## 2023-08-03 ENCOUNTER — Other Ambulatory Visit: Payer: Self-pay

## 2023-08-03 DIAGNOSIS — I10 Essential (primary) hypertension: Secondary | ICD-10-CM

## 2023-08-03 DIAGNOSIS — E782 Mixed hyperlipidemia: Secondary | ICD-10-CM

## 2023-08-03 DIAGNOSIS — E119 Type 2 diabetes mellitus without complications: Secondary | ICD-10-CM

## 2023-08-04 ENCOUNTER — Other Ambulatory Visit

## 2023-08-04 DIAGNOSIS — I10 Essential (primary) hypertension: Secondary | ICD-10-CM

## 2023-08-04 DIAGNOSIS — E782 Mixed hyperlipidemia: Secondary | ICD-10-CM

## 2023-08-04 DIAGNOSIS — E119 Type 2 diabetes mellitus without complications: Secondary | ICD-10-CM

## 2023-08-05 ENCOUNTER — Ambulatory Visit: Payer: Self-pay | Admitting: Family Medicine

## 2023-08-05 LAB — CBC WITH DIFFERENTIAL/PLATELET
Basophils Absolute: 0.1 x10E3/uL (ref 0.0–0.2)
Basos: 1 %
EOS (ABSOLUTE): 0.6 x10E3/uL — ABNORMAL HIGH (ref 0.0–0.4)
Eos: 7 %
Hematocrit: 43.1 % (ref 34.0–46.6)
Hemoglobin: 13.6 g/dL (ref 11.1–15.9)
Immature Grans (Abs): 0.1 x10E3/uL (ref 0.0–0.1)
Immature Granulocytes: 1 %
Lymphocytes Absolute: 3.4 x10E3/uL — ABNORMAL HIGH (ref 0.7–3.1)
Lymphs: 39 %
MCH: 28.2 pg (ref 26.6–33.0)
MCHC: 31.6 g/dL (ref 31.5–35.7)
MCV: 89 fL (ref 79–97)
Monocytes Absolute: 0.7 x10E3/uL (ref 0.1–0.9)
Monocytes: 8 %
Neutrophils Absolute: 3.9 x10E3/uL (ref 1.4–7.0)
Neutrophils: 44 %
Platelets: 272 x10E3/uL (ref 150–450)
RBC: 4.83 x10E6/uL (ref 3.77–5.28)
RDW: 13.5 % (ref 11.7–15.4)
WBC: 8.6 x10E3/uL (ref 3.4–10.8)

## 2023-08-05 LAB — COMPREHENSIVE METABOLIC PANEL WITH GFR
ALT: 14 IU/L (ref 0–32)
AST: 15 IU/L (ref 0–40)
Albumin: 4.1 g/dL (ref 3.9–4.9)
Alkaline Phosphatase: 85 IU/L (ref 44–121)
BUN/Creatinine Ratio: 22 (ref 12–28)
BUN: 18 mg/dL (ref 8–27)
Bilirubin Total: 0.4 mg/dL (ref 0.0–1.2)
CO2: 25 mmol/L (ref 20–29)
Calcium: 9.9 mg/dL (ref 8.7–10.3)
Chloride: 99 mmol/L (ref 96–106)
Creatinine, Ser: 0.81 mg/dL (ref 0.57–1.00)
Globulin, Total: 2.2 g/dL (ref 1.5–4.5)
Glucose: 95 mg/dL (ref 70–99)
Potassium: 4.4 mmol/L (ref 3.5–5.2)
Sodium: 139 mmol/L (ref 134–144)
Total Protein: 6.3 g/dL (ref 6.0–8.5)
eGFR: 78 mL/min/1.73 (ref >59)

## 2023-08-05 LAB — LIPID PANEL
Chol/HDL Ratio: 2.8 ratio (ref 0.0–4.4)
Cholesterol, Total: 144 mg/dL (ref 100–199)
HDL: 51 mg/dL (ref >39)
LDL Chol Calc (NIH): 61 mg/dL (ref 0–99)
Triglycerides: 193 mg/dL — ABNORMAL HIGH (ref 0–149)
VLDL Cholesterol Cal: 32 mg/dL (ref 5–40)

## 2023-08-05 LAB — HEMOGLOBIN A1C
Est. average glucose Bld gHb Est-mCnc: 134 mg/dL
Hgb A1c MFr Bld: 6.3 % — ABNORMAL HIGH (ref 4.8–5.6)

## 2023-08-11 ENCOUNTER — Encounter: Payer: Self-pay | Admitting: Family Medicine

## 2023-08-11 ENCOUNTER — Ambulatory Visit (INDEPENDENT_AMBULATORY_CARE_PROVIDER_SITE_OTHER): Admitting: Family Medicine

## 2023-08-11 DIAGNOSIS — K219 Gastro-esophageal reflux disease without esophagitis: Secondary | ICD-10-CM

## 2023-08-11 DIAGNOSIS — F411 Generalized anxiety disorder: Secondary | ICD-10-CM | POA: Diagnosis not present

## 2023-08-11 DIAGNOSIS — M5136 Other intervertebral disc degeneration, lumbar region with discogenic back pain only: Secondary | ICD-10-CM | POA: Diagnosis not present

## 2023-08-11 DIAGNOSIS — M51369 Other intervertebral disc degeneration, lumbar region without mention of lumbar back pain or lower extremity pain: Secondary | ICD-10-CM

## 2023-08-11 DIAGNOSIS — E782 Mixed hyperlipidemia: Secondary | ICD-10-CM

## 2023-08-11 DIAGNOSIS — I152 Hypertension secondary to endocrine disorders: Secondary | ICD-10-CM | POA: Diagnosis not present

## 2023-08-11 DIAGNOSIS — I10 Essential (primary) hypertension: Secondary | ICD-10-CM | POA: Insufficient documentation

## 2023-08-11 DIAGNOSIS — E1159 Type 2 diabetes mellitus with other circulatory complications: Secondary | ICD-10-CM | POA: Insufficient documentation

## 2023-08-11 MED ORDER — POTASSIUM CHLORIDE ER 10 MEQ PO TBCR: 10.0000 meq | EXTENDED_RELEASE_TABLET | Freq: Every morning | ORAL | 1 refills | Status: AC

## 2023-08-11 MED ORDER — ATENOLOL-CHLORTHALIDONE 100-25 MG PO TABS: 1.0000 | ORAL_TABLET | Freq: Every day | ORAL | 1 refills | Status: AC

## 2023-08-11 MED ORDER — LOSARTAN POTASSIUM 50 MG PO TABS: 50.0000 mg | ORAL_TABLET | Freq: Every day | ORAL | 1 refills | Status: AC

## 2023-08-11 MED ORDER — GABAPENTIN 300 MG PO CAPS: 300.0000 mg | ORAL_CAPSULE | Freq: Three times a day (TID) | ORAL | 3 refills | Status: AC

## 2023-08-11 MED ORDER — OMEPRAZOLE 20 MG PO CPDR: 20.0000 mg | DELAYED_RELEASE_CAPSULE | Freq: Every morning | ORAL | 1 refills | Status: AC

## 2023-08-11 MED ORDER — ROSUVASTATIN CALCIUM 5 MG PO TABS: 5.0000 mg | ORAL_TABLET | Freq: Every day | ORAL | 1 refills | Status: AC

## 2023-08-11 MED ORDER — BACLOFEN 10 MG PO TABS: 10.0000 mg | ORAL_TABLET | Freq: Three times a day (TID) | ORAL | 1 refills | Status: AC

## 2023-08-11 MED ORDER — CELECOXIB 200 MG PO CAPS: 200.0000 mg | ORAL_CAPSULE | Freq: Two times a day (BID) | ORAL | 1 refills | Status: AC

## 2023-08-11 MED ORDER — FENOFIBRATE 160 MG PO TABS: 160.0000 mg | ORAL_TABLET | Freq: Every day | ORAL | 1 refills | Status: DC

## 2023-08-11 MED ORDER — CLONAZEPAM 1 MG PO TABS: ORAL_TABLET | ORAL | 1 refills | Status: AC

## 2023-08-11 NOTE — Assessment & Plan Note (Addendum)
 Blood pressure controlled at 132/74 mmHg with current medications.  A1c at 6.3%, slightly increased but acceptable. No diabetes medication after Victoza  discontinued.

## 2023-08-11 NOTE — Patient Instructions (Signed)
 VISIT SUMMARY:  Today, we discussed the management of your elevated triglycerides and other ongoing health conditions. We have made some adjustments to your medications and will follow up in a few months to monitor your progress.  YOUR PLAN:  HYPERTRIGLYCERIDEMIA: Your triglyceride levels are elevated at 193 mg/dL. You previously stopped omega-3 supplements due to cost and have not tried over-the-counter fish oil supplements. Niacin is not recommended for you. -We will start you on fenofibrate  to help lower your triglycerides. -Please monitor for any muscle pain and report it to us . -We will follow up in 3 months to check your triglyceride levels and monitor for any side effects.  TYPE 2 DIABETES MELLITUS: Your hemoglobin A1c is at 6.3%, which is slightly higher than before but still within an acceptable range. You are not currently on any diabetes medication after stopping Victoza . -Continue with your current management plan. No new diabetes medication is needed at this time.  HYPERTENSION: Your blood pressure is well controlled at 132/74 mmHg with your current medications. -Continue taking atenolol , chlorthalidone , and losartan  as prescribed.  GASTROESOPHAGEAL REFLUX DISEASE (GERD): Your acid reflux is managed with omeprazole . -Continue taking omeprazole  20 mg daily.  Insomnia: You are using clonazepam  to help with sleep. -Continue taking clonazepam  as prescribed.  OSTEOARTHRITIS: You are taking Celebrex  for osteoarthritis pain. -Continue taking Celebrex  as prescribed.  GENERAL HEALTH MAINTENANCE: We reviewed and refilled your calcium , vitamin D3, potassium, Zyrtec , and baclofen  prescriptions. -Continue taking calcium  and vitamin D3 supplements. -Continue taking potassium, Zyrtec , and baclofen  as prescribed.

## 2023-08-11 NOTE — Assessment & Plan Note (Signed)
 Clonazepam  used for sleep.

## 2023-08-11 NOTE — Assessment & Plan Note (Signed)
 Takes Celebrex .

## 2023-08-11 NOTE — Progress Notes (Unsigned)
 Subjective:  Patient ID: Anna Greene, female    DOB: [birth year]  Age: 68 y.o. MRN: [id number]  Chief Complaint  Patient presents with   Medical Management of Chronic Issues    HPI:  Diabetes:  Complications: Hyperlipidemia, hypertension Glucose checking: No Glucose logs: No Hypoglycemia: She is not sure Most recent A1C: 6.0% Current medications: Tried ozempic  which helped, but changed to victoza  for patient assistance declined due to P[employer] wanting to do a credit check. Foot checks: Daily Eats healthy and tries to exercise.   Hyperlipidemia: Current medications: Rosuvastatin  5 mg daily. AT GOAL   Hypertension: Complications: Diabetes Current medications: Atenolol -chlorthalidone  100-25 mg daily, Losartan  50 mg daily.    GAD: Taking klonopin  0.5 mg daily PRN.    GERD: Omeprazole  20 mg once daily.   OA Back: on baclofen  three times a day and celebrex  twice daily. Gabapentin  300 mg three times a day.    Allergic rhinitis: on zyrtec .   Discussed the use of AI scribe software for clinical note transcription with the patient, who gave verbal consent to proceed.  History of Present Illness  Anna Greene is a 68 year old female who presents for management of elevated triglycerides.  Hypertriglyceridemia - Triglyceride level measured at 193 mg/dL - Previously used omega-3 supplements but discontinued due to high cost - Has not tried over-the-counter fish oil supplements - Inquired about niacin as a potential therapy  Dyslipidemia - Currently taking rosuvastatin  for cholesterol management  Type 2 diabetes mellitus - Last hemoglobin A1c was 6.3%, increased from prior value of 6.0% but remains within an acceptable range - Does not monitor blood glucose levels at home - Victoza  was previously discontinued due to a credit check requirement  Hypertension - Treated with atenolol , chlorthalidone , and losartan   Musculoskeletal pain - Experiences muscle pain - Takes a muscle  relaxant (baclofen ) for symptom relief - Recently underwent two nerve blocks, which took approximately one week to take effect and have been helpful  Gastroesophageal reflux disease - Takes omeprazole  for acid reflux  Sleep disturbance - Takes clonazepam  for sleep  Bone health - Takes calcium  and vitamin D3 supplements  Electrolyte supplementation - Takes a potassium supplement  Allergic symptoms - Uses Zyrtec          08/11/2023    9:11 AM 10/21/2022   10:51 AM 07/15/2022   10:32 AM 02/04/2022   10:39 AM 10/01/2021    1:32 PM  Depression screen PHQ 2/9  Decreased Interest 0 0 0 0 0  Down, Depressed, Hopeless 0 0 0 0 0  PHQ - 2 Score 0 0 0 0 0  Altered sleeping 0 1 0  0  Tired, decreased energy 0 0 0  0  Change in appetite 0 0 0  0  Feeling bad or failure about yourself  0 0 0  0  Trouble concentrating 0 0 0  0  Moving slowly or fidgety/restless 0 0 0  0  Suicidal thoughts 0 0 0  0  PHQ-9 Score 0 1 0  0  Difficult doing work/chores Not difficult at all Not difficult at all Not difficult at all  Not difficult at all        08/11/2023    9:11 AM  Fall Risk   Falls in the past year? 0  Number falls in past yr: 0  Injury with Fall? 0  Risk for fall due to : No Fall Risks  Follow up Falls evaluation completed    Patient Care Team: Aunya Lemler,  Abigail, MD as PCP - General (Family Medicine) Liam Lerner, MD as Consulting Physician (Orthopedic Surgery) Regenia Prentice Clack, MD as Consulting Physician (Ophthalmology)   Review of Systems  Constitutional:  Negative for chills, fatigue and fever.  HENT:  Negative for congestion, ear pain, rhinorrhea and sore throat.   Respiratory:  Negative for cough and shortness of breath.   Cardiovascular:  Negative for chest pain.  Gastrointestinal:  Negative for abdominal pain, constipation, diarrhea, nausea and vomiting.  Genitourinary:  Negative for dysuria and urgency.  Musculoskeletal:  Negative for back pain and myalgias.   Neurological:  Negative for dizziness, weakness, light-headedness and headaches.  Psychiatric/Behavioral:  Negative for dysphoric mood. The patient is not nervous/anxious.     Current Outpatient Medications on File Prior to Visit  Medication Sig Dispense Refill   amoxicillin  (AMOXIL ) 500 MG capsule TAKE FOUR CAPSULES BY MOUTH ONE HOUR BEFORE DENTAL PROCEDURE 20 capsule 0   cetirizine  (ZYRTEC ) 10 MG tablet Take 1 tablet (10 mg total) by mouth daily. 90 tablet 3   No current facility-administered medications on file prior to visit.   Past Medical History:  Diagnosis Date   Benign fasciculation-cramp syndrome    Depression    Diabetes mellitus without complication (HCC)    GERD (gastroesophageal reflux disease)    Hyperlipidemia    Hypertension    Nephrolithiasis    Osteoarthritis    Bilateral knees, degenerative disc disease.  Patient has had epidural steroid injections x2   Plantar fasciitis    Past Surgical History:  Procedure Laterality Date   BREAST REDUCTION SURGERY Bilateral 1996   CESAREAN SECTION  1976   CESAREAN SECTION  1979   KNEE ARTHROSCOPY W/ MENISCAL REPAIR Left 1995   Per patient muscle was repaired   KNEE ARTHROSCOPY W/ MENISCAL REPAIR Right 2003   knee surgery Left 10/2020   meniscal repair via arthroscopy   PANNICULECTOMY     REDUCTION MAMMAPLASTY  1996   REPLACEMENT TOTAL KNEE Right 2019   TONSILLECTOMY  1960    Family History  Problem Relation Age of Onset   Stroke Mother    Heart disease Mother    Cancer Father    Diabetes Sister    Anxiety disorder Sister    Depression Sister    Diabetes Sister    Diabetes Sister    Arthritis Sister    Stroke Brother    Breast cancer Neg Hx    Social History   Socioeconomic History   Marital status: Married    Spouse name: Ronnie   Number of children: 2   Years of education: Not on file   Highest education level: 12th grade  Occupational History   Occupation: TEFL teacher   Occupation:  Retired Teacher, music  Tobacco Use   Smoking status: Never   Smokeless tobacco: Never  Vaping Use   Vaping status: Never Used  Substance and Sexual Activity   Alcohol use: Never   Drug use: Never   Sexual activity: Yes    Partners: Male  Other Topics Concern   Not on file  Social History Narrative   Patient is married.  Has 2 adult children that live in [place]  (where she is from)   Social Drivers of Health   Financial Resource Strain: Low Risk  (08/07/2023)   Overall Financial Resource Strain (CARDIA)    Difficulty of Paying Living Expenses: Not hard at all  Food Insecurity: No Food Insecurity (08/07/2023)   Hunger Vital Sign    Worried About Running  Out of Food in the Last Year: Never true    Ran Out of Food in the Last Year: Never true  Transportation Needs: No Transportation Needs (08/07/2023)   PRAPARE - Administrator, Civil Service (Medical): No    Lack of Transportation (Non-Medical): No  Physical Activity: Inactive (08/07/2023)   Exercise Vital Sign    Days of Exercise per Week: 0 days    Minutes of Exercise per Session: Not on file  Stress: No Stress Concern Present (08/07/2023)   [employer] of Occupational Health - Occupational Stress Questionnaire    Feeling of Stress: Not at all  Social Connections: Unknown (08/07/2023)   Social Connection and Isolation Panel    Frequency of Communication with Friends and Family: Twice a week    Frequency of Social Gatherings with Friends and Family: Patient declined    Attends Religious Services: Never    Database administrator or Organizations: No    Attends Engineer, structural: Not on file    Marital Status: Married    Objective:  BP 132/74   Pulse 71   Temp 98.5 F (36.9 C)   Ht 5' 6 (1.676 m)   Wt 200 lb (90.7 kg)   LMP  (LMP Unknown)   SpO2 98%   BMI 32.28 kg/m      08/11/2023    9:05 AM 05/01/2023    2:21 PM 11/18/2022    7:55 AM  BP/Weight  Systolic BP 132 120 130  Diastolic BP 74  72 80  Wt. (Lbs) 200  204  BMI 32.28 kg/m2  32.93 kg/m2    Physical Exam Vitals reviewed.  Constitutional:      Appearance: Normal appearance. She is normal weight.  Neck:     Vascular: No carotid bruit.  Cardiovascular:     Rate and Rhythm: Normal rate and regular rhythm.     Pulses: Normal pulses.     Heart sounds: Normal heart sounds.  Pulmonary:     Effort: Pulmonary effort is normal. No respiratory distress.     Breath sounds: Normal breath sounds.  Abdominal:     General: Abdomen is flat. Bowel sounds are normal.     Palpations: Abdomen is soft.     Tenderness: There is no abdominal tenderness.  Neurological:     Mental Status: She is alert and oriented to person, place, and time.  Psychiatric:        Mood and Affect: Mood normal.        Behavior: Behavior normal.     {Perform Simple Foot Exam  Perform Detailed exam:1} Diabetic foot exam was performed with the following findings:   No deformities, ulcerations, or other skin breakdown Normal sensation of 10g monofilament Intact posterior tibialis and dorsalis pedis pulses      Lab Results  Component Value Date   WBC 8.6 08/04/2023   HGB 13.6 08/04/2023   HCT 43.1 08/04/2023   PLT 272 08/04/2023   GLUCOSE 95 08/04/2023   CHOL 144 08/04/2023   TRIG 193 (H) 08/04/2023   HDL 51 08/04/2023   LDLCALC 61 08/04/2023   ALT 14 08/04/2023   AST 15 08/04/2023   NA 139 08/04/2023   K 4.4 08/04/2023   CL 99 08/04/2023   CREATININE 0.81 08/04/2023   BUN 18 08/04/2023   CO2 25 08/04/2023   TSH 0.948 01/28/2022   HGBA1C 6.3 (H) 08/04/2023      Assessment & Plan:  Hypertension associated with type 2  diabetes mellitus (HCC) Assessment & Plan: Blood pressure controlled at 132/74 mmHg with current medications.  A1c at 6.3%, slightly increased but acceptable. No diabetes medication after Victoza  discontinued.  Orders: -     Atenolol -Chlorthalidone ; Take 1 tablet by mouth daily.  Dispense: 90 tablet; Refill: 1 -      Losartan  Potassium; Take 1 tablet (50 mg total) by mouth daily.  Dispense: 90 tablet; Refill: 1 -     Potassium Chloride  ER; Take 1 tablet (10 mEq total) by mouth every morning.  Dispense: 90 tablet; Refill: 1  Degenerative disc disease, lumbar Assessment & Plan: Takes Celebrex .  Orders: -     Celecoxib ; Take 1 capsule (200 mg total) by mouth 2 (two) times daily.  Dispense: 180 capsule; Refill: 1  GAD (generalized anxiety disorder) Assessment & Plan: Clonazepam  used for sleep.  Orders: -     clonazePAM ; TAKE 1 TABLET BY MOUTH AT BEDTIME AS NEEDED FOR ANXIETY  Dispense: 90 tablet; Refill: 1  Degeneration of intervertebral disc of lumbar region with discogenic back pain Assessment & Plan: Takes Celebrex .  Orders: -     Gabapentin ; Take 1 capsule (300 mg total) by mouth 3 (three) times daily.  Dispense: 270 capsule; Refill: 3  Gastroesophageal reflux disease without esophagitis Assessment & Plan: Managed with omeprazole  20 mg daily.  Orders: -     Omeprazole ; Take 1 capsule (20 mg total) by mouth every morning.  Dispense: 90 capsule; Refill: 1  Mixed hyperlipidemia Assessment & Plan: Triglycerides elevated at 193 mg/dL. Omega-3 discontinued due to cost. Niacin not recommended. Fenofibrate  suggested. Monitor for muscle pain. - Prescribe fenofibrate . - Follow-up in 3 months to assess triglycerides and monitor for muscle pain.  Orders: -     Rosuvastatin  Calcium ; Take 1 tablet (5 mg total) by mouth daily.  Dispense: 90 tablet; Refill: 1 -     Fenofibrate ; Take 1 tablet (160 mg total) by mouth daily.  Dispense: 90 tablet; Refill: 1  Other orders -     Baclofen ; Take 1 tablet (10 mg total) by mouth 3 (three) times daily.  Dispense: 270 tablet; Refill: 1    Meds ordered this encounter  Medications   atenolol -chlorthalidone  (TENORETIC ) 100-25 MG tablet    Sig: Take 1 tablet by mouth daily.    Dispense:  90 tablet    Refill:  1   baclofen  (LIORESAL ) 10 MG tablet    Sig:  Take 1 tablet (10 mg total) by mouth 3 (three) times daily.    Dispense:  270 tablet    Refill:  1   celecoxib  (CELEBREX ) 200 MG capsule    Sig: Take 1 capsule (200 mg total) by mouth 2 (two) times daily.    Dispense:  180 capsule    Refill:  1   clonazePAM  (KLONOPIN ) 1 MG tablet    Sig: TAKE 1 TABLET BY MOUTH AT BEDTIME AS NEEDED FOR ANXIETY    Dispense:  90 tablet    Refill:  1   gabapentin  (NEURONTIN ) 300 MG capsule    Sig: Take 1 capsule (300 mg total) by mouth 3 (three) times daily.    Dispense:  270 capsule    Refill:  3   losartan  (COZAAR ) 50 MG tablet    Sig: Take 1 tablet (50 mg total) by mouth daily.    Dispense:  90 tablet    Refill:  1   omeprazole  (PRILOSEC) 20 MG capsule    Sig: Take 1 capsule (20 mg total) by mouth every  morning.    Dispense:  90 capsule    Refill:  1   potassium chloride  (KLOR-CON ) 10 MEQ tablet    Sig: Take 1 tablet (10 mEq total) by mouth every morning.    Dispense:  90 tablet    Refill:  1   rosuvastatin  (CRESTOR ) 5 MG tablet    Sig: Take 1 tablet (5 mg total) by mouth daily.    Dispense:  90 tablet    Refill:  1   fenofibrate  160 MG tablet    Sig: Take 1 tablet (160 mg total) by mouth daily.    Dispense:  90 tablet    Refill:  1    No orders of the defined types were placed in this encounter.    Follow-up: Return in about 3 months (around 11/11/2023) for chronic follow up, lab visit comes ahead for labs.   I,Marla I Leal-Borjas,acting as a scribe for Abigail Free, MD.,have documented all relevant documentation on the behalf of Abigail Free, MD,as directed by  Abigail Free, MD while in the presence of Abigail Free, MD.    An After Visit Summary was printed and given to the patient.  I attest that I have reviewed this visit and agree with the plan scribed by my staff.   Abigail Free, MD [facility] [phone]

## 2023-08-11 NOTE — Assessment & Plan Note (Signed)
 Triglycerides elevated at 193 mg/dL. Omega-3 discontinued due to cost. Niacin not recommended. Fenofibrate  suggested. Monitor for muscle pain. - Prescribe fenofibrate . - Follow-up in 3 months to assess triglycerides and monitor for muscle pain.

## 2023-08-11 NOTE — Assessment & Plan Note (Signed)
Managed with omeprazole 20 mg daily

## 2023-08-25 DIAGNOSIS — M5416 Radiculopathy, lumbar region: Secondary | ICD-10-CM | POA: Diagnosis not present

## 2023-09-09 DIAGNOSIS — M1712 Unilateral primary osteoarthritis, left knee: Secondary | ICD-10-CM | POA: Diagnosis not present

## 2023-09-09 DIAGNOSIS — M170 Bilateral primary osteoarthritis of knee: Secondary | ICD-10-CM | POA: Diagnosis not present

## 2023-09-09 DIAGNOSIS — M1711 Unilateral primary osteoarthritis, right knee: Secondary | ICD-10-CM | POA: Diagnosis not present

## 2023-10-20 DIAGNOSIS — E119 Type 2 diabetes mellitus without complications: Secondary | ICD-10-CM | POA: Diagnosis not present

## 2023-10-20 DIAGNOSIS — H524 Presbyopia: Secondary | ICD-10-CM | POA: Diagnosis not present

## 2023-10-20 LAB — OPHTHALMOLOGY REPORT-SCANNED

## 2023-11-15 ENCOUNTER — Other Ambulatory Visit: Payer: Self-pay

## 2023-11-15 DIAGNOSIS — I10 Essential (primary) hypertension: Secondary | ICD-10-CM

## 2023-11-15 DIAGNOSIS — E782 Mixed hyperlipidemia: Secondary | ICD-10-CM

## 2023-11-15 DIAGNOSIS — E119 Type 2 diabetes mellitus without complications: Secondary | ICD-10-CM

## 2023-11-17 ENCOUNTER — Other Ambulatory Visit

## 2023-11-17 DIAGNOSIS — E119 Type 2 diabetes mellitus without complications: Secondary | ICD-10-CM

## 2023-11-17 DIAGNOSIS — E782 Mixed hyperlipidemia: Secondary | ICD-10-CM | POA: Diagnosis not present

## 2023-11-17 DIAGNOSIS — I10 Essential (primary) hypertension: Secondary | ICD-10-CM

## 2023-11-17 DIAGNOSIS — H40013 Open angle with borderline findings, low risk, bilateral: Secondary | ICD-10-CM | POA: Diagnosis not present

## 2023-11-18 ENCOUNTER — Ambulatory Visit: Payer: Self-pay | Admitting: Family Medicine

## 2023-11-18 LAB — COMPREHENSIVE METABOLIC PANEL WITH GFR
ALT: 30 IU/L (ref 0–32)
AST: 36 IU/L (ref 0–40)
Albumin: 4 g/dL (ref 3.9–4.9)
Alkaline Phosphatase: 71 IU/L (ref 49–135)
BUN/Creatinine Ratio: 33 — ABNORMAL HIGH (ref 12–28)
BUN: 33 mg/dL — ABNORMAL HIGH (ref 8–27)
Bilirubin Total: 0.4 mg/dL (ref 0.0–1.2)
CO2: 25 mmol/L (ref 20–29)
Calcium: 10.1 mg/dL (ref 8.7–10.3)
Chloride: 102 mmol/L (ref 96–106)
Creatinine, Ser: 1.01 mg/dL — ABNORMAL HIGH (ref 0.57–1.00)
Globulin, Total: 2.6 g/dL (ref 1.5–4.5)
Glucose: 116 mg/dL — ABNORMAL HIGH (ref 70–99)
Potassium: 4.7 mmol/L (ref 3.5–5.2)
Sodium: 141 mmol/L (ref 134–144)
Total Protein: 6.6 g/dL (ref 6.0–8.5)
eGFR: 60 mL/min/1.73 (ref >59)

## 2023-11-18 LAB — CBC WITH DIFFERENTIAL/PLATELET
Basophils Absolute: 0.1 x10E3/uL (ref 0.0–0.2)
Basos: 1 %
EOS (ABSOLUTE): 0.3 x10E3/uL (ref 0.0–0.4)
Eos: 5 %
Hematocrit: 39.5 % (ref 34.0–46.6)
Hemoglobin: 12.5 g/dL (ref 11.1–15.9)
Immature Grans (Abs): 0 x10E3/uL (ref 0.0–0.1)
Immature Granulocytes: 0 %
Lymphocytes Absolute: 1.9 x10E3/uL (ref 0.7–3.1)
Lymphs: 34 %
MCH: 28.2 pg (ref 26.6–33.0)
MCHC: 31.6 g/dL (ref 31.5–35.7)
MCV: 89 fL (ref 79–97)
Monocytes Absolute: 0.6 x10E3/uL (ref 0.1–0.9)
Monocytes: 10 %
Neutrophils Absolute: 2.9 x10E3/uL (ref 1.4–7.0)
Neutrophils: 50 %
Platelets: 326 x10E3/uL (ref 150–450)
RBC: 4.44 x10E6/uL (ref 3.77–5.28)
RDW: 12.6 % (ref 11.7–15.4)
WBC: 5.7 x10E3/uL (ref 3.4–10.8)

## 2023-11-18 LAB — LIPID PANEL
Chol/HDL Ratio: 1.9 ratio (ref 0.0–4.4)
Cholesterol, Total: 99 mg/dL — ABNORMAL LOW (ref 100–199)
HDL: 51 mg/dL (ref >39)
LDL Chol Calc (NIH): 35 mg/dL (ref 0–99)
Triglycerides: 57 mg/dL (ref 0–149)
VLDL Cholesterol Cal: 13 mg/dL (ref 5–40)

## 2023-11-18 LAB — HEMOGLOBIN A1C
Est. average glucose Bld gHb Est-mCnc: 126 mg/dL
Hgb A1c MFr Bld: 6 % — ABNORMAL HIGH (ref 4.8–5.6)

## 2023-11-24 DIAGNOSIS — K080 Exfoliation of teeth due to systemic causes: Secondary | ICD-10-CM | POA: Diagnosis not present

## 2023-11-30 NOTE — Progress Notes (Signed)
 Subjective:  Patient ID: Anna Greene, female    DOB: 25-May-1955  Age: 68 y.o. MRN: [id number]  Chief Complaint  Patient presents with   Medical Management of Chronic Issues    HPI: Discussed the use of AI scribe software for clinical note transcription with the patient, who gave verbal consent to proceed.  History of Present Illness Anna Greene is a 68 year old female with arthritis and spinal stenosis who presents with joint pain and fatigue.  Polyarticular pain and morning stiffness - Significant pain in multiple joints, including knees, hips, and back - Morning stiffness, particularly in hands, impacting ability to perform tasks such as peeling potatoes - Hands loosen up relatively quickly, but the rest of her body takes longer - Celebrex  provides limited relief for pain - Gabapentin  increased to 600 mg as needed for pain in knees, buttocks, calves, and ankle, which is effective - Tylenol is ineffective for pain  Spinal stenosis - History of spinal stenosis - Received back injections for pain management  Fatigue and sleep disturbance - Wakes up feeling unrested despite sleeping eight hours and taking a sleeping pill - Fatigue persists throughout the day - Falls asleep unexpectedly, such as while watching television - No snoring or apnea episodes as reported by her husband  Headaches - Frequent headaches, occurring two to three times per week - Headaches are different from prior migraines - Ibuprofen used for headache relief  Ocular symptoms - Three styes in left eye over past four months - Treated with ointment  Allergic rhinitis and cough - Stuffy nose due to allergies - Takes Zyrtec  for allergy symptoms - Cough at night attributed to allergies  Dizziness - Occasional dizziness without falls  Constitutional and systemic symptoms - No fevers, chills, sweats, earaches, sore throat, chest pain, shortness of breath, abdominal pain, bowel problems, nausea, vomiting,  depression, or confusion       12/01/2023    8:10 AM 08/11/2023    9:11 AM 10/21/2022   10:51 AM 07/15/2022   10:32 AM 02/04/2022   10:39 AM  Depression screen PHQ 2/9  Decreased Interest 0 0 0 0 0  Down, Depressed, Hopeless 0 0 0 0 0  PHQ - 2 Score 0 0 0 0 0  Altered sleeping 0 0 1 0   Tired, decreased energy 0 0 0 0   Change in appetite 0 0 0 0   Feeling bad or failure about yourself  0 0 0 0   Trouble concentrating 0 0 0 0   Moving slowly or fidgety/restless 0 0 0 0   Suicidal thoughts 0 0 0 0   PHQ-9 Score 0 0  1  0    Difficult doing work/chores Not difficult at all Not difficult at all Not difficult at all Not difficult at all      Data saved with a previous flowsheet row definition        12/01/2023    8:09 AM  Fall Risk   Falls in the past year? 0  Number falls in past yr: 0  Injury with Fall? 0  Risk for fall due to : No Fall Risks  Follow up Falls evaluation completed    Patient Care Team: Sherre Clapper, MD as PCP - General (Family Medicine) Liam Lerner, MD as Consulting Physician (Orthopedic Surgery) Regenia Prentice Clack, MD as Consulting Physician (Ophthalmology)   Review of Systems  Constitutional:  Positive for fatigue. Negative for chills and fever.  HENT:  Positive for congestion.  Negative for sore throat.   Respiratory:  Negative for cough and shortness of breath.   Cardiovascular:  Negative for chest pain.  Gastrointestinal:  Negative for abdominal pain, constipation, diarrhea, nausea and vomiting.  Endocrine: Positive for polyuria. Negative for polydipsia and polyphagia.  Genitourinary:  Negative for dysuria.  Musculoskeletal:  Positive for arthralgias, back pain and myalgias.  Neurological:  Positive for dizziness (occasional. unsteady on feet sometimes.).  Psychiatric/Behavioral:  Negative for confusion, decreased concentration and sleep disturbance. The patient is not nervous/anxious.     Current Outpatient Medications on File Prior to Visit   Medication Sig Dispense Refill   atenolol -chlorthalidone  (TENORETIC ) 100-25 MG tablet Take 1 tablet by mouth daily. 90 tablet 1   baclofen  (LIORESAL ) 10 MG tablet Take 1 tablet (10 mg total) by mouth 3 (three) times daily. 270 tablet 1   celecoxib  (CELEBREX ) 200 MG capsule Take 1 capsule (200 mg total) by mouth 2 (two) times daily. 180 capsule 1   cetirizine  (ZYRTEC ) 10 MG tablet Take 1 tablet (10 mg total) by mouth daily. 90 tablet 3   clonazePAM  (KLONOPIN ) 1 MG tablet TAKE 1 TABLET BY MOUTH AT BEDTIME AS NEEDED FOR ANXIETY 90 tablet 1   fenofibrate  160 MG tablet Take 1 tablet (160 mg total) by mouth daily. 90 tablet 1   gabapentin  (NEURONTIN ) 300 MG capsule Take 1 capsule (300 mg total) by mouth 3 (three) times daily. 270 capsule 3   losartan  (COZAAR ) 50 MG tablet Take 1 tablet (50 mg total) by mouth daily. 90 tablet 1   omeprazole  (PRILOSEC) 20 MG capsule Take 1 capsule (20 mg total) by mouth every morning. 90 capsule 1   potassium chloride  (KLOR-CON ) 10 MEQ tablet Take 1 tablet (10 mEq total) by mouth every morning. 90 tablet 1   rosuvastatin  (CRESTOR ) 5 MG tablet Take 1 tablet (5 mg total) by mouth daily. 90 tablet 1   No current facility-administered medications on file prior to visit.   Past Medical History:  Diagnosis Date   Benign fasciculation-cramp syndrome    Depression    Diabetes mellitus without complication (HCC)    GERD (gastroesophageal reflux disease)    Hyperlipidemia    Hypertension    Nephrolithiasis    Osteoarthritis    Bilateral knees, degenerative disc disease.  Patient has had epidural steroid injections x2   Plantar fasciitis    Past Surgical History:  Procedure Laterality Date   BREAST REDUCTION SURGERY Bilateral 1996   CESAREAN SECTION  1976   CESAREAN SECTION  1979   KNEE ARTHROSCOPY W/ MENISCAL REPAIR Left 1995   Per patient muscle was repaired   KNEE ARTHROSCOPY W/ MENISCAL REPAIR Right 2003   knee surgery Left 10/2020   meniscal repair via  arthroscopy   PANNICULECTOMY     REDUCTION MAMMAPLASTY  1996   REPLACEMENT TOTAL KNEE Right 2019   TONSILLECTOMY  1960    Family History  Problem Relation Age of Onset   Stroke Mother    Heart disease Mother    Cancer Father    Diabetes Sister    Anxiety disorder Sister    Depression Sister    Diabetes Sister    Diabetes Sister    Arthritis Sister    Stroke Brother    Breast cancer Neg Hx    Social History   Socioeconomic History   Marital status: Married    Spouse name: Lyndy   Number of children: 2   Years of education: Not on file   Highest  education level: 12th grade  Occupational History   Occupation: [employer] Teacher   Occupation: Retired Teacher, Music  Tobacco Use   Smoking status: Never   Smokeless tobacco: Never  Vaping Use   Vaping status: Never Used  Substance and Sexual Activity   Alcohol use: Never   Drug use: Never   Sexual activity: Yes    Partners: Male  Other Topics Concern   Not on file  Social History Narrative   Patient is married.  Has 2 adult children that live in [place]  (where she is from)   Social Drivers of Corporate Investment Banker Strain: Low Risk  (11/28/2023)   Overall Financial Resource Strain (CARDIA)    Difficulty of Paying Living Expenses: Not hard at all  Food Insecurity: No Food Insecurity (11/28/2023)   Hunger Vital Sign    Worried About Running Out of Food in the Last Year: Never true    Ran Out of Food in the Last Year: Never true  Transportation Needs: No Transportation Needs (11/28/2023)   PRAPARE - Administrator, Civil Service (Medical): No    Lack of Transportation (Non-Medical): No  Physical Activity: Inactive (11/28/2023)   Exercise Vital Sign    Days of Exercise per Week: 0 days    Minutes of Exercise per Session: Not on file  Stress: No Stress Concern Present (11/28/2023)   [employer] of Occupational Health - Occupational Stress Questionnaire    Feeling of Stress: Not at all  Social  Connections: Moderately Isolated (11/28/2023)   Social Connection and Isolation Panel    Frequency of Communication with Friends and Family: Twice a week    Frequency of Social Gatherings with Friends and Family: Once a week    Attends Religious Services: Never    Database Administrator or Organizations: No    Attends Engineer, Structural: Not on file    Marital Status: Married    Objective:  BP 112/72   Pulse 65   Temp 98.2 F (36.8 C)   Ht 5' 6 (1.676 m)   Wt 196 lb 12.8 oz (89.3 kg)   LMP  (LMP Unknown)   SpO2 98%   BMI 31.76 kg/m      12/01/2023    8:03 AM 08/11/2023    9:05 AM 05/01/2023    2:21 PM  BP/Weight  Systolic BP 112 132 120  Diastolic BP 72 74 72  Wt. (Lbs) 196.8 200   BMI 31.76 kg/m2 32.28 kg/m2     Physical Exam Vitals reviewed.  Constitutional:      General: She is not in acute distress.    Appearance: Normal appearance.  HENT:     Nose: Nose normal. No congestion or rhinorrhea.  Eyes:     Conjunctiva/sclera: Conjunctivae normal.  Neck:     Thyroid : No thyroid  mass.     Vascular: No carotid bruit.  Cardiovascular:     Rate and Rhythm: Normal rate and regular rhythm.     Pulses: Normal pulses.     Heart sounds: Normal heart sounds. No murmur heard. Pulmonary:     Effort: Pulmonary effort is normal.     Breath sounds: Normal breath sounds.  Abdominal:     General: Bowel sounds are normal.     Palpations: Abdomen is soft. There is no mass.     Tenderness: There is no abdominal tenderness.  Musculoskeletal:        General: Tenderness (FM trigger points all positive.) present.  Comments: FROM of BL knees.  Right hip rom causes discomfort.  Left hip no discomfort.   Lymphadenopathy:     Cervical: No cervical adenopathy.  Neurological:     Mental Status: She is alert and oriented to person, place, and time.     Cranial Nerves: No cranial nerve deficit.  Psychiatric:        Mood and Affect: Mood normal.        Behavior:  Behavior normal.      Diabetic foot exam was performed with the following findings:   No deformities, ulcerations, or other skin breakdown Normal sensation of 10g monofilament Intact posterior tibialis and dorsalis pedis pulses      Lab Results  Component Value Date   WBC 5.7 11/17/2023   HGB 12.5 11/17/2023   HCT 39.5 11/17/2023   PLT 326 11/17/2023   GLUCOSE 116 (H) 11/17/2023   CHOL 99 (L) 11/17/2023   TRIG 57 11/17/2023   HDL 51 11/17/2023   LDLCALC 35 11/17/2023   ALT 30 11/17/2023   AST 36 11/17/2023   NA 141 11/17/2023   K 4.7 11/17/2023   CL 102 11/17/2023   CREATININE 1.01 (H) 11/17/2023   BUN 33 (H) 11/17/2023   CO2 25 11/17/2023   TSH 0.009 (L) 12/01/2023   HGBA1C 6.0 (H) 11/17/2023    Results for orders placed or performed in visit on 12/01/23  Rheumatoid factor   Collection Time: 12/01/23  9:33 AM  Result Value Ref Range   Rheumatoid fact SerPl-aCnc 10.3 <14.0 IU/mL  CYCLIC CITRUL PEPTIDE ANTIBODY, IGG/IGA   Collection Time: 12/01/23  9:33 AM  Result Value Ref Range   Cyclic Citrullin Peptide Ab 9 0 - 19 units  Sedimentation rate   Collection Time: 12/01/23  9:33 AM  Result Value Ref Range   Sed Rate 4 0 - 40 mm/hr  C-reactive protein   Collection Time: 12/01/23  9:33 AM  Result Value Ref Range   CRP 13 (H) 0 - 10 mg/L  TSH   Collection Time: 12/01/23  9:33 AM  Result Value Ref Range   TSH 0.009 (L) 0.450 - 4.500 uIU/mL  T4, free   Collection Time: 12/01/23  9:33 AM  Result Value Ref Range   Free T4 1.75 0.82 - 1.77 ng/dL  Specimen status report   Collection Time: 12/01/23  9:33 AM  Result Value Ref Range   specimen status report Comment   .  Assessment & Plan:   Assessment & Plan Combined hyperlipidemia associated with type 2 diabetes mellitus (HCC) Control: good Recommend check feet daily. Recommend annual eye exams. Medicines: Continue victoza  1.8 mg daily. Continue to work on eating a healthy diet and exercise.  Labs reviewed  today.        Essential hypertension, benign Well controlled.  No changes to medicines. Atenolol -chlorthalidone  100-25 mg daily, Losartan  50 mg daily.  Continue to work on eating a healthy diet and exercise.  Labs reviewed today.      Pain in other joint Osteoarthritis in multiple joints with morning stiffness and pain. Gabapentin  provides some relief. - Continue gabapentin  as needed for joint pain. Orders:   Rheumatoid factor   CYCLIC CITRUL PEPTIDE ANTIBODY, IGG/IGA   Sedimentation rate   C-reactive protein  Fibromyalgia Chronic pain and fatigue consistent with fibromyalgia. Gabapentin  provides partial relief. Duloxetine  considered for treatment, pending insurance changes. - Prescribed duloxetine  30 mg daily for one week, then increase to 60 mg daily. - Ordered blood work for rheumatoid arthritis  and thyroid  function. Orders:   DULoxetine  (CYMBALTA ) 30 MG capsule; Take 1 capsule (30 mg total) by mouth daily for 7 days, THEN 2 capsules (60 mg total) daily for 23 days.  Other fatigue Chronic pain and fatigue consistent with fibromyalgia. Gabapentin  provides partial relief.  - Prescribed duloxetine  30 mg daily for one week, then increase to 60 mg daily. - Ordered blood work for rheumatoid arthritis and thyroid  fu Orders:   TSH  Chronic pain syndrome Chronic pain and fatigue consistent with fibromyalgia. Gabapentin  provides partial relief. Duloxetine  considered for treatment - Prescribed duloxetine  30 mg daily for one week, then increase to 60 mg daily. - Ordered blood work for rheumatoid arthritis and thyroid  function.    Spinal stenosis of lumbar region at multiple levels Chronic back pain due to spinal stenosis managed with orthopedist-administered injections.    Chronic nonintractable headache, unspecified headache type Non-migraine headaches occurring a couple of times a week. Ibuprofen provides some relief. - Continue ibuprofen as needed for headache relief.     Hordeolum externum of left eye, unspecified eyelid Recurrent styes in the left eye over four months, treated with ointment.    Seasonal allergic rhinitis due to pollen Chronic nasal congestion managed with Zyrtec . - Continue Zyrtec  for allergy management.     Body mass index is 31.76 kg/m.    Meds ordered this encounter  Medications   DULoxetine  (CYMBALTA ) 30 MG capsule    Sig: Take 1 capsule (30 mg total) by mouth daily for 7 days, THEN 2 capsules (60 mg total) daily for 23 days.    Dispense:  53 capsule    Refill:  0    Orders Placed This Encounter  Procedures   Rheumatoid factor   CYCLIC CITRUL PEPTIDE ANTIBODY, IGG/IGA   Sedimentation rate   C-reactive protein   TSH   T4, free   Specimen status report      I,Marla I Leal-Borjas,acting as a scribe for Abigail Free, MD.,have documented all relevant documentation on the behalf of Abigail Free, MD,as directed by  Abigail Free, MD while in the presence of Abigail Free, MD.   Follow-up: Return in about 4 weeks (around 12/29/2023) for AWV AND FOLLOW UP FOR fibromyalgia/headaches.  An After Visit Summary was printed and given to the patient.  Abigail Free, MD [facility] [phone]

## 2023-12-01 ENCOUNTER — Ambulatory Visit: Admitting: Family Medicine

## 2023-12-01 ENCOUNTER — Encounter: Payer: Self-pay | Admitting: Family Medicine

## 2023-12-01 VITALS — BP 112/72 | HR 65 | Temp 98.2°F | Ht 66.0 in | Wt 196.8 lb

## 2023-12-01 DIAGNOSIS — M2559 Pain in other specified joint: Secondary | ICD-10-CM | POA: Diagnosis not present

## 2023-12-01 DIAGNOSIS — J301 Allergic rhinitis due to pollen: Secondary | ICD-10-CM

## 2023-12-01 DIAGNOSIS — I10 Essential (primary) hypertension: Secondary | ICD-10-CM

## 2023-12-01 DIAGNOSIS — E669 Obesity, unspecified: Secondary | ICD-10-CM | POA: Insufficient documentation

## 2023-12-01 DIAGNOSIS — E1169 Type 2 diabetes mellitus with other specified complication: Secondary | ICD-10-CM | POA: Diagnosis not present

## 2023-12-01 DIAGNOSIS — M48061 Spinal stenosis, lumbar region without neurogenic claudication: Secondary | ICD-10-CM

## 2023-12-01 DIAGNOSIS — R5383 Other fatigue: Secondary | ICD-10-CM

## 2023-12-01 DIAGNOSIS — E0590 Thyrotoxicosis, unspecified without thyrotoxic crisis or storm: Secondary | ICD-10-CM | POA: Diagnosis not present

## 2023-12-01 DIAGNOSIS — G2581 Restless legs syndrome: Secondary | ICD-10-CM | POA: Insufficient documentation

## 2023-12-01 DIAGNOSIS — H00016 Hordeolum externum left eye, unspecified eyelid: Secondary | ICD-10-CM

## 2023-12-01 DIAGNOSIS — M797 Fibromyalgia: Secondary | ICD-10-CM | POA: Diagnosis not present

## 2023-12-01 DIAGNOSIS — E782 Mixed hyperlipidemia: Secondary | ICD-10-CM

## 2023-12-01 DIAGNOSIS — G894 Chronic pain syndrome: Secondary | ICD-10-CM

## 2023-12-01 DIAGNOSIS — G479 Sleep disorder, unspecified: Secondary | ICD-10-CM | POA: Insufficient documentation

## 2023-12-01 DIAGNOSIS — R519 Headache, unspecified: Secondary | ICD-10-CM

## 2023-12-01 MED ORDER — DULOXETINE HCL 30 MG PO CPEP: ORAL_CAPSULE | ORAL | 0 refills | Status: DC

## 2023-12-01 NOTE — Patient Instructions (Signed)
  VISIT SUMMARY: Today, we addressed your joint pain, fatigue, headaches, and other symptoms. We made some adjustments to your medications and discussed further testing to better understand your condition.  YOUR PLAN: FIBROMYALGIA WITH CHRONIC FATIGUE AND CHRONIC PAIN: You have chronic pain and fatigue that is consistent with fibromyalgia. -Start taking duloxetine 30 mg daily for one week, then increase to 60 mg daily. -We will do blood work to check for rheumatoid arthritis and thyroid  function.  OSTEOARTHRITIS INVOLVING BACK, KNEES, HIPS, AND HANDS: You have osteoarthritis in multiple joints causing morning stiffness and pain.  SPINAL STENOSIS: You have chronic back pain due to spinal stenosis. -Continue with the injections administered by your orthopedist for pain management. -Continue taking gabapentin  300 mg three times a day. Consider increase but concerned about somnolence.   CHRONIC HEADACHE: You have non-migraine headaches occurring a couple of times a week. -work on decreasing stress  -recommend tylenol instead of ibuprofen.   ALLERGIC RHINITIS: You have chronic nasal congestion due to allergies. -Continue taking Zyrtec  for allergy management.                      Contains text generated by [employer]bridge.                                 Contains text generated by [employer]bridge.

## 2023-12-01 NOTE — Assessment & Plan Note (Addendum)
 Well controlled.  No changes to medicines. Atenolol -chlorthalidone  100-25 mg daily, Losartan  50 mg daily.  Continue to work on eating a healthy diet and exercise.  Labs reviewed today.

## 2023-12-01 NOTE — Assessment & Plan Note (Addendum)
 Control: good Recommend check feet daily. Recommend annual eye exams. Medicines: Continue victoza  1.8 mg daily. Continue to work on eating a healthy diet and exercise.  Labs reviewed today.

## 2023-12-02 ENCOUNTER — Ambulatory Visit: Payer: Self-pay | Admitting: Family Medicine

## 2023-12-02 LAB — SEDIMENTATION RATE: Sed Rate: 4 mm/h (ref 0–40)

## 2023-12-02 LAB — CYCLIC CITRUL PEPTIDE ANTIBODY, IGG/IGA: Cyclic Citrullin Peptide Ab: 9 U (ref 0–19)

## 2023-12-02 LAB — TSH: TSH: 0.009 u[IU]/mL — ABNORMAL LOW (ref 0.450–4.500)

## 2023-12-02 LAB — RHEUMATOID FACTOR: Rheumatoid fact SerPl-aCnc: 10.3 [IU]/mL (ref <14.0)

## 2023-12-02 LAB — C-REACTIVE PROTEIN: CRP: 13 mg/L — ABNORMAL HIGH (ref 0–10)

## 2023-12-05 LAB — T4, FREE: Free T4: 1.75 ng/dL (ref 0.82–1.77)

## 2023-12-05 LAB — SPECIMEN STATUS REPORT

## 2023-12-06 DIAGNOSIS — M2550 Pain in unspecified joint: Secondary | ICD-10-CM | POA: Insufficient documentation

## 2023-12-06 DIAGNOSIS — M797 Fibromyalgia: Secondary | ICD-10-CM | POA: Insufficient documentation

## 2023-12-06 DIAGNOSIS — G894 Chronic pain syndrome: Secondary | ICD-10-CM | POA: Insufficient documentation

## 2023-12-06 DIAGNOSIS — H00016 Hordeolum externum left eye, unspecified eyelid: Secondary | ICD-10-CM | POA: Insufficient documentation

## 2023-12-06 DIAGNOSIS — G8929 Other chronic pain: Secondary | ICD-10-CM | POA: Insufficient documentation

## 2023-12-06 DIAGNOSIS — J301 Allergic rhinitis due to pollen: Secondary | ICD-10-CM | POA: Insufficient documentation

## 2023-12-06 DIAGNOSIS — R5383 Other fatigue: Secondary | ICD-10-CM | POA: Insufficient documentation

## 2023-12-06 NOTE — Assessment & Plan Note (Addendum)
 Chronic nasal congestion managed with Zyrtec . - Continue Zyrtec  for allergy management.

## 2023-12-06 NOTE — Assessment & Plan Note (Addendum)
 Chronic pain and fatigue consistent with fibromyalgia. Gabapentin  provides partial relief. Duloxetine  considered for treatment, pending insurance changes. - Prescribed duloxetine  30 mg daily for one week, then increase to 60 mg daily. - Ordered blood work for rheumatoid arthritis and thyroid  function. Orders:   DULoxetine  (CYMBALTA ) 30 MG capsule; Take 1 capsule (30 mg total) by mouth daily for 7 days, THEN 2 capsules (60 mg total) daily for 23 days.

## 2023-12-06 NOTE — Assessment & Plan Note (Addendum)
 Chronic pain and fatigue consistent with fibromyalgia. Gabapentin  provides partial relief.  - Prescribed duloxetine  30 mg daily for one week, then increase to 60 mg daily. - Ordered blood work for rheumatoid arthritis and thyroid  fu Orders:   TSH

## 2023-12-06 NOTE — Assessment & Plan Note (Addendum)
 Chronic pain and fatigue consistent with fibromyalgia. Gabapentin  provides partial relief. Duloxetine  considered for treatment - Prescribed duloxetine  30 mg daily for one week, then increase to 60 mg daily. - Ordered blood work for rheumatoid arthritis and thyroid  function.

## 2023-12-06 NOTE — Assessment & Plan Note (Addendum)
 Non-migraine headaches occurring a couple of times a week. Ibuprofen provides some relief. - Continue ibuprofen as needed for headache relief.

## 2023-12-06 NOTE — Assessment & Plan Note (Addendum)
 Osteoarthritis in multiple joints with morning stiffness and pain. Gabapentin  provides some relief. - Continue gabapentin  as needed for joint pain. Orders:   Rheumatoid factor   CYCLIC CITRUL PEPTIDE ANTIBODY, IGG/IGA   Sedimentation rate   C-reactive protein

## 2023-12-06 NOTE — Assessment & Plan Note (Addendum)
 Chronic back pain due to spinal stenosis managed with orthopedist-administered injections.

## 2023-12-06 NOTE — Assessment & Plan Note (Addendum)
 Recurrent styes in the left eye over four months, treated with ointment.

## 2023-12-10 ENCOUNTER — Other Ambulatory Visit: Payer: Self-pay

## 2023-12-10 DIAGNOSIS — R7989 Other specified abnormal findings of blood chemistry: Secondary | ICD-10-CM

## 2023-12-22 ENCOUNTER — Inpatient Hospital Stay (INDEPENDENT_AMBULATORY_CARE_PROVIDER_SITE_OTHER): Admission: RE | Admit: 2023-12-22 | Discharge: 2023-12-22 | Attending: Family Medicine | Admitting: Family Medicine

## 2023-12-22 DIAGNOSIS — R7989 Other specified abnormal findings of blood chemistry: Secondary | ICD-10-CM

## 2023-12-23 ENCOUNTER — Ambulatory Visit: Payer: Self-pay | Admitting: Family Medicine

## 2023-12-24 ENCOUNTER — Other Ambulatory Visit: Payer: Self-pay | Admitting: Family Medicine

## 2023-12-24 DIAGNOSIS — E042 Nontoxic multinodular goiter: Secondary | ICD-10-CM

## 2023-12-24 DIAGNOSIS — E0590 Thyrotoxicosis, unspecified without thyrotoxic crisis or storm: Secondary | ICD-10-CM

## 2023-12-25 ENCOUNTER — Other Ambulatory Visit: Payer: Self-pay | Admitting: Family Medicine

## 2023-12-25 DIAGNOSIS — M797 Fibromyalgia: Secondary | ICD-10-CM

## 2023-12-25 MED ORDER — DULOXETINE HCL 60 MG PO CPEP: 60.0000 mg | ORAL_CAPSULE | Freq: Every day | ORAL | 3 refills | Status: DC

## 2023-12-30 ENCOUNTER — Telehealth: Payer: Self-pay

## 2023-12-30 ENCOUNTER — Ambulatory Visit: Admitting: Family Medicine

## 2023-12-30 ENCOUNTER — Encounter: Payer: Self-pay | Admitting: Family Medicine

## 2023-12-30 VITALS — BP 102/70 | HR 73 | Temp 97.9°F | Resp 18 | Ht 66.0 in | Wt 191.0 lb

## 2023-12-30 DIAGNOSIS — Z0000 Encounter for general adult medical examination without abnormal findings: Secondary | ICD-10-CM | POA: Diagnosis not present

## 2023-12-30 DIAGNOSIS — M797 Fibromyalgia: Secondary | ICD-10-CM | POA: Diagnosis not present

## 2023-12-30 MED ORDER — DULOXETINE HCL 60 MG PO CPEP: 60.0000 mg | ORAL_CAPSULE | Freq: Every day | ORAL | 3 refills | Status: AC

## 2023-12-30 NOTE — Progress Notes (Signed)
 Chief Complaint  Patient presents with   Medicare Wellness    AWV     Subjective:   Anna Greene is a 68 y.o. female who presents for a Medicare Annual Wellness Visit.  Visit info / Clinical Intake: Medicare Wellness Visit Type:: Subsequent Annual Wellness Visit Persons participating in visit and providing information:: patient Medicare Wellness Visit Mode:: In-person (required for WTM) Interpreter Needed?: No Pre-visit prep was completed: no AWV questionnaire completed by patient prior to visit?: yes Date:: 12/26/23 Living arrangements:: lives with spouse/significant other Patient's Overall Health Status Rating: good Typical amount of pain: (!) a lot Does pain affect daily life?: (!) yes Are you currently prescribed opioids?: no  Dietary Habits and Nutritional Risks How many meals a day?: 3 Eats fruit and vegetables daily?: (!) no Most meals are obtained by: preparing own meals In the last 2 weeks, have you had any of the following?: none Diabetic:: no  Functional Status Activities of Daily Living (to include ambulation/medication): Independent Ambulation: Dependent Medication Administration: Dependent Home Management (perform basic housework or laundry): Dependent Manage your own finances?: yes Primary transportation is: driving Concerns about vision?: no *vision screening is required for WTM* Concerns about hearing?: no  Fall Screening Falls in the past year?: 0 Number of falls in past year: 0 Was there an injury with Fall?: 0 Fall Risk Category Calculator: 0 Patient Fall Risk Level: Low Fall Risk  Fall Risk Patient at Risk for Falls Due to: No Fall Risks Fall risk Follow up: Falls evaluation completed  Home and Transportation Safety: All rugs have non-skid backing?: yes All stairs or steps have railings?: N/A, no stairs Grab bars in the bathtub or shower?: yes Have non-skid surface in bathtub or shower?: yes Good home lighting?: yes Regular seat belt  use?: yes Hospital stays in the last year:: no  Cognitive Assessment Difficulty concentrating, remembering, or making decisions? : no Will 6CIT or Mini Cog be Completed: yes What year is it?: 0 points What month is it?: 0 points Give patient an address phrase to remember (5 components): [place] [place] About what time is it?: 0 points Count backwards from 20 to 1: 0 points Say the months of the year in reverse: 0 points Repeat the address phrase from earlier: 0 points 6 CIT Score: 0 points  Advance Directives (For Healthcare) Does Patient Have a Medical Advance Directive?: No Would patient like information on creating a medical advance directive?: Yes (MAU/Ambulatory/Procedural Areas - Information given)  Reviewed/Updated  Reviewed/Updated: Reviewed All (Medical, Surgical, Family, Medications, Allergies, Care Teams, Patient Goals)    Allergies (verified) Metformin and related; Ramipril; and Tuberculin, ppd   Current Medications (verified) Outpatient Encounter Medications as of 12/30/2023  Medication Sig   atenolol -chlorthalidone  (TENORETIC ) 100-25 MG tablet Take 1 tablet by mouth daily.   baclofen  (LIORESAL ) 10 MG tablet Take 1 tablet (10 mg total) by mouth 3 (three) times daily.   celecoxib  (CELEBREX ) 200 MG capsule Take 1 capsule (200 mg total) by mouth 2 (two) times daily.   cetirizine  (ZYRTEC ) 10 MG tablet Take 1 tablet (10 mg total) by mouth daily.   clonazePAM  (KLONOPIN ) 1 MG tablet TAKE 1 TABLET BY MOUTH AT BEDTIME AS NEEDED FOR ANXIETY   fenofibrate  160 MG tablet Take 1 tablet (160 mg total) by mouth daily.   gabapentin  (NEURONTIN ) 300 MG capsule Take 1 capsule (300 mg total) by mouth 3 (three) times daily.   losartan  (COZAAR ) 50 MG tablet Take 1 tablet (50  mg total) by mouth daily.   omeprazole  (PRILOSEC) 20 MG capsule Take 1 capsule (20 mg total) by mouth every morning.   potassium chloride  (KLOR-CON ) 10 MEQ tablet Take 1 tablet (10 mEq total) by mouth  every morning.   rosuvastatin  (CRESTOR ) 5 MG tablet Take 1 tablet (5 mg total) by mouth daily.   [DISCONTINUED] DULoxetine  (CYMBALTA ) 60 MG capsule Take 1 capsule (60 mg total) by mouth daily.   DULoxetine  (CYMBALTA ) 60 MG capsule Take 1 capsule (60 mg total) by mouth daily.   No facility-administered encounter medications on file as of 12/30/2023.    History: Past Medical History:  Diagnosis Date   Allergy    Benign fasciculation-cramp syndrome    Depression    Diabetes mellitus without complication (HCC)    GERD (gastroesophageal reflux disease)    Hyperlipidemia    Hypertension    Nephrolithiasis    Osteoarthritis    Bilateral knees, degenerative disc disease.  Patient has had epidural steroid injections x2   Plantar fasciitis    Thyroid  disease    Past Surgical History:  Procedure Laterality Date   BREAST REDUCTION SURGERY Bilateral 1996   CESAREAN SECTION  1976   CESAREAN SECTION  1979   EYE SURGERY     JOINT REPLACEMENT     KNEE ARTHROSCOPY W/ MENISCAL REPAIR Left 1995   Per patient muscle was repaired   KNEE ARTHROSCOPY W/ MENISCAL REPAIR Right 2003   knee surgery Left 10/2020   meniscal repair via arthroscopy   PANNICULECTOMY     REDUCTION MAMMAPLASTY  1996   REPLACEMENT TOTAL KNEE Right 2019   TONSILLECTOMY  1960   TUBAL LIGATION     Family History  Problem Relation Age of Onset   Stroke Mother    Heart disease Mother    Cancer Father    Diabetes Sister    Anxiety disorder Sister    Depression Sister    Diabetes Sister    Diabetes Sister    Arthritis Sister    Stroke Brother    Breast cancer Neg Hx    Social History   Occupational History   Occupation: [employer] Teacher   Occupation: Retired Teacher, Music  Tobacco Use   Smoking status: Never   Smokeless tobacco: Never  Vaping Use   Vaping status: Never Used  Substance and Sexual Activity   Alcohol use: Never   Drug use: Never   Sexual activity: Yes    Partners: Male    Birth  control/protection: None   Tobacco Counseling Counseling given: Not Answered  SDOH Screenings   Food Insecurity: No Food Insecurity (12/30/2023)  Housing: Low Risk (12/30/2023)  Transportation Needs: No Transportation Needs (12/30/2023)  Utilities: Not At Risk (12/30/2023)  Alcohol Screen: Low Risk (11/28/2023)  Depression (PHQ2-9): Low Risk (12/30/2023)  Financial Resource Strain: Low Risk (11/28/2023)  Physical Activity: Inactive (11/28/2023)  Social Connections: Moderately Isolated (12/30/2023)  Stress: No Stress Concern Present (12/30/2023)  Tobacco Use: Low Risk (12/30/2023)  Health Literacy: Adequate Health Literacy (12/30/2023)   See flowsheets for full screening details  Depression Screen PHQ 2 & 9 Depression Scale- Over the past 2 weeks, how often have you been bothered by any of the following problems? Little interest or pleasure in doing things: 0 Feeling down, depressed, or hopeless (PHQ Adolescent also includes...irritable): 0 PHQ-2 Total Score: 0 Trouble falling or staying asleep, or sleeping too much: 0 Feeling tired or having little energy: 0 Poor appetite or overeating (PHQ Adolescent also includes...weight loss): 0 Feeling  bad about yourself - or that you are a failure or have let yourself or your family down: 0 Trouble concentrating on things, such as reading the newspaper or watching television (PHQ Adolescent also includes...like school work): 0 Moving or speaking so slowly that other people could have noticed. Or the opposite - being so fidgety or restless that you have been moving around a lot more than usual: 0 Thoughts that you would be better off dead, or of hurting yourself in some way: 0 PHQ-9 Total Score: 0 If you checked off any problems, how difficult have these problems made it for you to do your work, take care of things at home, or get along with other people?: Not difficult at all  Depression Treatment Depression Interventions/Treatment :  EYV7-0 Score <4 Follow-up Not Indicated     Goals Addressed             This Visit's Progress    Set My Target A1C-Diabetes Type 2       Timeframe:  Long-Range Goal Priority:  High Start Date:                             Expected End Date:                       Follow Up Date 12/29/24   - set target A1C    Why is this important?   Your target A1C is decided together by you and your doctor.  It is based on several things like your age and other health issues.    Notes:      Track and Manage My Blood Pressure-Hypertension   On track    Timeframe:  Long-Range Goal Priority:  High Start Date:                             Expected End Date:                       Follow Up Date 01/01/22   - choose a place to take my blood pressure (home, clinic or office, retail store) - write blood pressure results in a log or diary    Why is this important?   You won't feel high blood pressure, but it can still hurt your blood vessels.  High blood pressure can cause heart or kidney problems. It can also cause a stroke.  Making lifestyle changes like losing a little weight or eating less salt will help.  Checking your blood pressure at home and at different times of the day can help to control blood pressure.  If the doctor prescribes medicine remember to take it the way the doctor ordered.  Call the office if you cannot afford the medicine or if there are questions about it.     Notes:              Objective:    Today's Vitals   12/30/23 1431  BP: 102/70  Pulse: 73  Resp: 18  Temp: 97.9 F (36.6 C)  TempSrc: Temporal  SpO2: 99%  Weight: 191 lb (86.6 kg)  Height: 5' 6 (1.676 m)  PainSc: 6   PainLoc: Back   Body mass index is 30.83 kg/m.  Hearing/Vision screen No results found. Immunizations and Health Maintenance Health Maintenance  Topic Date Due   Hepatitis C Screening  Never  done   Zoster Vaccines- Shingrix (1 of 2) 03/02/2024 (Originally 06/22/2003)    DTaP/Tdap/Td (3 - Td or Tdap) 11/30/2024 (Originally 02/16/2023)   Diabetic kidney evaluation - Urine ACR  04/27/2024   COVID-19 Vaccine (9 - 2025-26 season) 05/02/2024   Colonoscopy  05/09/2024   HEMOGLOBIN A1C  05/16/2024   OPHTHALMOLOGY EXAM  10/19/2024   Diabetic kidney evaluation - eGFR measurement  11/16/2024   FOOT EXAM  11/30/2024   Medicare Annual Wellness (AWV)  12/29/2024   Mammogram  05/27/2025   Pneumococcal Vaccine: 50+ Years  Completed   Influenza Vaccine  Completed   Bone Density Scan  Completed   Meningococcal B Vaccine  Aged Out        Assessment/Plan:  This is a routine wellness examination for [place].  Encounter for Medicare annual wellness exam Assessment & Plan: Things to do to keep yourself healthy  - Exercise at least 30-45 minutes a day, 3-4 days a week.  - Eat a low-fat diet with lots of fruits and vegetables, up to 7-9 servings per day.  - Seatbelts can save your life. Wear them always.  - Smoke detectors on every level of your home, check batteries every year.  - Eye Doctor - have an eye exam every 1-2 years  - Safe sex - if you may be exposed to STDs, use a condom.  - Alcohol -  If you drink, do it moderately, less than 2 drinks per day.  - Health Care Power of Attorney. Choose someone to speak for you if you are not able.  - Depression is common in our stressful world.If you're feeling down or losing interest in things you normally enjoy, please come in for a visit.  - Violence - If anyone is threatening or hurting you, please call immediately.    Fibromyalgia Assessment & Plan: Chronic pain and fatigue consistent with fibromyalgia. Gabapentin  provides partial relief. Duloxetine  initiated for treatment starting at 30 mg and increased to 60 mg. Patient reports great satisfaction with medication. - Continue taking 60 mg once daily for fibromyalgia  - Refill sent to [facility] due to original Rx for 53 doses.   Orders: -     DULoxetine  HCl; Take 1  capsule (60 mg total) by mouth daily.  Dispense: 60 capsule; Refill: 3     Patient Care Team: Sherre Clapper, MD as PCP - General (Family Medicine) Liam Lerner, MD as Consulting Physician (Orthopedic Surgery) Regenia Prentice Clack, MD as Consulting Physician (Ophthalmology)  I have personally reviewed and noted the following in the patients chart:   Medical and social history Use of alcohol, tobacco or illicit drugs  Current medications and supplements including opioid prescriptions. Functional ability and status Nutritional status Physical activity Advanced directives List of other physicians Hospitalizations, surgeries, and ER visits in previous 12 months Vitals Screenings to include cognitive, depression, and falls Referrals and appointments  No orders of the defined types were placed in this encounter.  In addition, I have reviewed and discussed with patient certain preventive protocols, quality metrics, and best practice recommendations. A written personalized care plan for preventive services as well as general preventive health recommendations were provided to patient.   Harrie Cedar, FNP [facility] [phone]     12/30/2023   Return in 1 year (on 12/29/2024).  After Visit Summary: (In Person-Printed) AVS printed and given to the patient

## 2023-12-30 NOTE — Assessment & Plan Note (Signed)
 Chronic pain and fatigue consistent with fibromyalgia. Gabapentin  provides partial relief. Duloxetine  initiated for treatment starting at 30 mg and increased to 60 mg. Patient reports great satisfaction with medication. - Continue taking 60 mg once daily for fibromyalgia  - Refill sent to [facility] due to original Rx for 53 doses.

## 2023-12-30 NOTE — Assessment & Plan Note (Signed)

## 2023-12-30 NOTE — Telephone Encounter (Signed)
 Order faxed over today.  Copied from CRM #[id number]. Topic: Clinical - Request for Lab/Test Order >> Dec 30, 2023  3:50 PM Rosaria BRAVO wrote: Reason for CRM: [facility] called requesting a fax for a Thyroid  uptake order  Fax: [phone]

## 2024-01-01 ENCOUNTER — Telehealth: Payer: Self-pay

## 2024-01-01 NOTE — Telephone Encounter (Signed)
 Faxed the TSH results and medication list by Epic.  Copied from CRM #[id number]. Topic: General - Other >> Jan 01, 2024  2:27 PM Victoria B wrote: Reason for CRM: Dawn from [facility] called needs copy of tsht  34 labs ad copy of med ist faxed. Their fax is [phone]

## 2024-01-12 ENCOUNTER — Other Ambulatory Visit

## 2024-01-18 ENCOUNTER — Other Ambulatory Visit: Payer: Self-pay

## 2024-01-18 DIAGNOSIS — E1169 Type 2 diabetes mellitus with other specified complication: Secondary | ICD-10-CM

## 2024-01-18 DIAGNOSIS — R5383 Other fatigue: Secondary | ICD-10-CM

## 2024-01-19 ENCOUNTER — Other Ambulatory Visit: Payer: Medicare (Managed Care)

## 2024-01-19 DIAGNOSIS — E1169 Type 2 diabetes mellitus with other specified complication: Secondary | ICD-10-CM

## 2024-01-19 DIAGNOSIS — R5383 Other fatigue: Secondary | ICD-10-CM

## 2024-01-19 LAB — T4, FREE: Free T4: 1.56 ng/dL (ref 0.82–1.77)

## 2024-01-19 LAB — TSH: TSH: 0.012 u[IU]/mL — ABNORMAL LOW (ref 0.450–4.500)

## 2024-01-21 ENCOUNTER — Ambulatory Visit: Payer: Self-pay | Admitting: Family Medicine

## 2024-01-21 DIAGNOSIS — E041 Nontoxic single thyroid nodule: Secondary | ICD-10-CM

## 2024-01-21 DIAGNOSIS — E0590 Thyrotoxicosis, unspecified without thyrotoxic crisis or storm: Secondary | ICD-10-CM

## 2024-01-29 ENCOUNTER — Encounter: Payer: Self-pay | Admitting: Family Medicine

## 2024-01-29 ENCOUNTER — Ambulatory Visit: Payer: Medicare (Managed Care) | Admitting: Family Medicine

## 2024-01-29 VITALS — BP 128/78 | HR 68 | Temp 97.8°F | Ht 66.0 in | Wt 194.0 lb

## 2024-01-29 DIAGNOSIS — N3000 Acute cystitis without hematuria: Secondary | ICD-10-CM | POA: Insufficient documentation

## 2024-01-29 DIAGNOSIS — R350 Frequency of micturition: Secondary | ICD-10-CM | POA: Insufficient documentation

## 2024-01-29 LAB — POCT URINALYSIS DIP (CLINITEK)
Bilirubin, UA: NEGATIVE
Blood, UA: NEGATIVE
Glucose, UA: NEGATIVE mg/dL
Ketones, POC UA: NEGATIVE mg/dL
Nitrite, UA: NEGATIVE
POC PROTEIN,UA: NEGATIVE
Spec Grav, UA: 1.015
Urobilinogen, UA: NEGATIVE U/dL — AB
pH, UA: 6

## 2024-01-29 MED ORDER — CIPROFLOXACIN HCL 500 MG PO TABS: 500.0000 mg | ORAL_TABLET | Freq: Two times a day (BID) | ORAL | 0 refills | Status: AC

## 2024-01-29 MED ORDER — FLUCONAZOLE 150 MG PO TABS: 150.0000 mg | ORAL_TABLET | Freq: Every day | ORAL | 0 refills | Status: AC

## 2024-01-29 NOTE — Progress Notes (Signed)
 "  Acute Office Visit  Subjective:    Patient ID: Anna Greene, female    DOB: Oct 27, 1955, 69 y.o.   MRN: [id number]  Chief Complaint  Patient presents with   Bladder issues    Discussed the use of AI scribe software for clinical note transcription with the patient, who gave verbal consent to proceed.  History of Present Illness   Anna Greene is a 69 year old female who presents with urinary incontinence and symptoms of a urinary tract infection.  Urinary incontinence - Intermittent urinary incontinence for the past four weeks - Significant incontinence yesterday, with three episodes of thoroughly wetting herself - Incontinence occurs when standing up after sitting, with a sudden rush of urine  Lower urinary tract symptoms - Intermittent sensation of developing a urinary tract infection, with symptoms resolving the next day - Urinary frequency with only small amounts of urine passed - Urinary urgency with pressure - No dysuria (no pain or burning at the beginning or end of urination) - No suprapubic pain - No back or flank pain - No fever  Endocrine evaluation - Awaiting further evaluation for a hot thyroid  nodule     Past Medical History:  Diagnosis Date   Allergy    Benign fasciculation-cramp syndrome    Depression    Diabetes mellitus without complication (HCC)    GERD (gastroesophageal reflux disease)    Hyperlipidemia    Hypertension    Nephrolithiasis    Osteoarthritis    Bilateral knees, degenerative disc disease.  Patient has had epidural steroid injections x2   Plantar fasciitis    Thyroid  disease     Past Surgical History:  Procedure Laterality Date   BREAST REDUCTION SURGERY Bilateral 1996   CESAREAN SECTION  1976   CESAREAN SECTION  1979   EYE SURGERY     JOINT REPLACEMENT     KNEE ARTHROSCOPY W/ MENISCAL REPAIR Left 1995   Per patient muscle was repaired   KNEE ARTHROSCOPY W/ MENISCAL REPAIR Right 2003   knee surgery Left 10/2020   meniscal repair  via arthroscopy   PANNICULECTOMY     REDUCTION MAMMAPLASTY  1996   REPLACEMENT TOTAL KNEE Right 2019   TONSILLECTOMY  1960   TUBAL LIGATION      Family History  Problem Relation Age of Onset   Stroke Mother    Heart disease Mother    Cancer Father    Diabetes Sister    Anxiety disorder Sister    Depression Sister    Diabetes Sister    Diabetes Sister    Arthritis Sister    Stroke Brother    Breast cancer Neg Hx     Social History   Socioeconomic History   Marital status: Married    Spouse name: Ronnie   Number of children: 2   Years of education: Not on file   Highest education level: 12th grade  Occupational History   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  Has 2 adult children that live in [place]  (where she is from)   Social Drivers of  Health   Tobacco Use: Low Risk (01/29/2024)   Patient History    Smoking Tobacco Use: Never    Smokeless Tobacco Use: Never    Passive Exposure: Not on file  Financial Resource Strain: Low Risk (11/28/2023)   Overall Financial Resource Strain (CARDIA)    Difficulty of Paying Living Expenses: Not hard at all  Food Insecurity: No Food Insecurity (12/30/2023)   Epic    Worried About Programme Researcher, Broadcasting/film/video in the Last Year: Never true    Ran Out of Food in the Last Year: Never true  Transportation Needs: No Transportation Needs (12/30/2023)   Epic    Lack of Transportation (Medical): No    Lack of Transportation (Non-Medical): No  Physical Activity: Inactive (12/30/2023)   Exercise Vital Sign    Days of Exercise per Week: 0 days    Minutes of Exercise per Session: 30  min  Stress: No Stress Concern Present (12/30/2023)   [employer] of Occupational Health - Occupational Stress Questionnaire    Feeling of Stress: Not at all  Social Connections: Moderately Isolated (12/30/2023)   Social Connection and Isolation Panel    Frequency of Communication with Friends and Family: Twice a week    Frequency of Social Gatherings with Friends and Family: Once a week    Attends Religious Services: Never    Database Administrator or Organizations: No    Attends Banker Meetings: Never    Marital Status: Married  Catering Manager Violence: Not At Risk (12/30/2023)   Epic    Fear of Current or Ex-Partner: No    Emotionally Abused: No    Physically Abused: No    Sexually Abused: No  Depression (PHQ2-9): Low Risk (12/30/2023)   Depression (PHQ2-9)    PHQ-2 Score: 0  Alcohol Screen: Low Risk (11/28/2023)   Alcohol Screen    Last Alcohol Screening Score (AUDIT): 1  Housing: Low Risk (12/30/2023)   Epic    Unable to Pay for Housing in the Last Year: No    Number of Times Moved in the Last Year: 0    Homeless in the Last Year: No  Utilities: Not At Risk (12/30/2023)   Epic    Threatened with loss of utilities: No  Health Literacy: Adequate Health Literacy (12/30/2023)   B1300 Health Literacy    Frequency of need for help with medical instructions: Never    Outpatient Medications Prior to Visit  Medication Sig Dispense Refill   atenolol -chlorthalidone  (TENORETIC ) 100-25 MG tablet Take 1 tablet by mouth daily. 90 tablet 1   baclofen  (LIORESAL ) 10 MG tablet Take 1 tablet (10 mg total) by mouth 3 (three) times daily. 270 tablet 1   celecoxib  (CELEBREX ) 200 MG capsule Take 1 capsule (200 mg total) by mouth 2 (two) times daily. 180 capsule 1   cetirizine  (ZYRTEC ) 10 MG tablet Take 1 tablet (10 mg total) by mouth daily. 90 tablet 3   clonazePAM  (KLONOPIN ) 1 MG tablet TAKE 1 TABLET BY MOUTH AT BEDTIME AS NEEDED FOR ANXIETY 90 tablet 1   DULoxetine   (CYMBALTA ) 60 MG capsule Take 1 capsule (60 mg total) by mouth daily. 60 capsule 3   fenofibrate  160 MG tablet Take 1 tablet (160 mg total) by mouth daily. 90 tablet 1   gabapentin  (NEURONTIN ) 300 MG capsule Take 1 capsule (300 mg total) by mouth 3 (three) times daily. 270 capsule 3   losartan  (COZAAR ) 50 MG tablet Take 1 tablet (50 mg total) by mouth daily.  90 tablet 1   omeprazole  (PRILOSEC) 20 MG capsule Take 1 capsule (20 mg total) by mouth every morning. 90 capsule 1   potassium chloride  (KLOR-CON ) 10 MEQ tablet Take 1 tablet (10 mEq total) by mouth every morning. 90 tablet 1   rosuvastatin  (CRESTOR ) 5 MG tablet Take 1 tablet (5 mg total) by mouth daily. 90 tablet 1   No facility-administered medications prior to visit.    Allergies[1]  Review of Systems  Constitutional:  Negative for appetite change, fatigue and fever.  HENT:  Negative for congestion, ear pain, sinus pressure and sore throat.   Respiratory:  Negative for cough, chest tightness, shortness of breath and wheezing.   Cardiovascular:  Negative for chest pain and palpitations.  Gastrointestinal:  Negative for abdominal pain, constipation, diarrhea, nausea and vomiting.  Genitourinary:  Positive for frequency and urgency (pressure). Negative for dysuria, flank pain and hematuria.       Urinary incontinence  Musculoskeletal:  Negative for arthralgias, back pain, joint swelling and myalgias.  Skin:  Negative for rash.  Neurological:  Negative for dizziness, weakness and headaches.  Psychiatric/Behavioral:  Negative for dysphoric mood. The patient is not nervous/anxious.        Objective:        01/29/2024    3:38 PM 12/30/2023    2:31 PM 12/01/2023    8:03 AM  Vitals with BMI  Height 5' 6 5' 6 5' 6  Weight 194 lbs 191 lbs 196 lbs 13 oz  BMI 31.33 30.84 31.78  Systolic 128 102 887  Diastolic 78 70 72  Pulse 68 73 65    Orthostatic VS for the past 72 hrs (Last 3 readings):  Patient Position BP Location   01/29/24 1538 Sitting Left Arm     Physical Exam Vitals reviewed.  Constitutional:      General: She is not in acute distress.    Appearance: Normal appearance.  Eyes:     Conjunctiva/sclera: Conjunctivae normal.  Cardiovascular:     Rate and Rhythm: Normal rate and regular rhythm.     Heart sounds: Normal heart sounds. No murmur heard. Pulmonary:     Effort: Pulmonary effort is normal. No respiratory distress.     Breath sounds: Normal breath sounds. No wheezing.  Abdominal:     Palpations: Abdomen is soft.  Musculoskeletal:        General: Normal range of motion.     Cervical back: Normal range of motion.  Skin:    General: Skin is warm.  Neurological:     Mental Status: She is alert and oriented to person, place, and time. Mental status is at baseline.  Psychiatric:        Mood and Affect: Mood normal.        Behavior: Behavior normal.     Health Maintenance Due  Topic Date Due   Hepatitis C Screening  Never done   Colonoscopy  05/09/2024    There are no preventive care reminders to display for this patient.   Lab Results  Component Value Date   TSH 0.012 (L) 01/19/2024   Lab Results  Component Value Date   WBC 5.7 11/17/2023   HGB 12.5 11/17/2023   HCT 39.5 11/17/2023   MCV 89 11/17/2023   PLT 326 11/17/2023   Lab Results  Component Value Date   NA 141 11/17/2023   K 4.7 11/17/2023   CO2 25 11/17/2023   GLUCOSE 116 (H) 11/17/2023   BUN 33 (H) 11/17/2023  CREATININE 1.01 (H) 11/17/2023   BILITOT 0.4 11/17/2023   ALKPHOS 71 11/17/2023   AST 36 11/17/2023   ALT 30 11/17/2023   PROT 6.6 11/17/2023   ALBUMIN 4.0 11/17/2023   CALCIUM  10.1 11/17/2023   EGFR 60 11/17/2023   Lab Results  Component Value Date   CHOL 99 (L) 11/17/2023   Lab Results  Component Value Date   HDL 51 11/17/2023   Lab Results  Component Value Date   LDLCALC 35 11/17/2023   Lab Results  Component Value Date   TRIG 57 11/17/2023   Lab Results  Component Value  Date   CHOLHDL 1.9 11/17/2023   Lab Results  Component Value Date   HGBA1C 6.0 (H) 11/17/2023        Results for orders placed or performed in visit on 01/29/24  POCT URINALYSIS DIP (CLINITEK)   Collection Time: 01/29/24  3:46 PM  Result Value Ref Range   Color, UA     Clarity, UA     Glucose, UA negative negative mg/dL   Bilirubin, UA negative negative   Ketones, POC UA negative negative mg/dL   Spec Grav, UA 8.984 8.989 - 1.025   Blood, UA negative negative   pH, UA 6.0 5.0 - 8.0   POC PROTEIN,UA negative negative, trace   Urobilinogen, UA negative (A) 0.2 or 1.0 E.U./dL   Nitrite, UA Negative Negative   Leukocytes, UA Large (3+) (A) Negative     Assessment & Plan:   Assessment & Plan Acute cystitis without hematuria Urinary Tract Infection (UTI) Confirmed UTI via urinalysis. Ciprofloxacin  chosen. Diflucan  prescribed to prevent yeast infection. - Prescribe ciprofloxacin  500 mg twice daily for 10 days. - Send urine sample for culture. - Prescribe Diflucan  if yeast infection symptoms develop. Orders:   Urine Culture   ciprofloxacin  (CIPRO ) 500 MG tablet; Take 1 tablet (500 mg total) by mouth 2 (two) times daily for 10 days.   fluconazole  (DIFLUCAN ) 150 MG tablet; Take 1 tablet (150 mg total) by mouth daily for 1 dose.  Urinary frequency UTI Patient here with symptoms of incontinence started yesterday. UA in the office showed Lab Results  Component Value Date   COLORU yellow 05/14/2021   CLARITYU clear 05/14/2021   GLUCOSEUR negative 01/29/2024   BILIRUBINUR negative 01/29/2024   KETONESU NEGATIVE 10/01/2021   SPECGRAV 1.015 01/29/2024   RBCUR negative 01/29/2024   PHUR 6.0 01/29/2024   PROTEINUR Negative 10/01/2021   UROBILINOGEN negative (A) 01/29/2024   LEUKOCYTESUR Large (3+) (A) 01/29/2024    No suprapubic tenderness noted on exam.  She does not appear septic, has no flank tenderness.  Vitals reassuring.   Plan: Symptoms suggestive of urinary tract  infection. I have prescribed Cipro  500 mg by mouth TWICE A DAY for 10 days. Your symptoms should gradually improve. Call if the burning worsens, you develop a fever, back pain or pelvic pain or if your symptom do not resolve after completing the antibiotic. Drink plenty of fluids Complete the full course of antibiotics even if the symptoms resolve Remember to wipe from front to back and don't hold it in! If possible, empty your bladder every 4 hours. Advised to watch out for any fever/chills/back pain and to go to the emergency room if she does for evaluation for pyelonephritis and IV fluids and IV antibiotics.   Hot thyroid  nodule potentially affecting urinary symptoms. ENT evaluation scheduled. - Refer to ENT specialist for evaluation and management.Orders:   POCT URINALYSIS DIP (CLINITEK)   Orders Placed  This Encounter  Procedures   Urine Culture   POCT URINALYSIS DIP (CLINITEK)     Follow-up: Return if symptoms worsen or fail to improve.  An After Visit Summary was printed and given to the patient.   I,Lauren M Auman,acting as a scribe for Harrie CHRISTELLA Cedar, FNP.,have documented all relevant documentation on the behalf of Harrie CHRISTELLA Cedar, FNP,as directed by  Harrie CHRISTELLA Cedar, FNP while in the presence of Harrie CHRISTELLA Cedar, FNP.   I attest that I have reviewed this visit and agree with the plan scribed by my staff.  Harrie CHRISTELLA Cedar, FNP [facility] [phone]         [1]  Allergies Allergen Reactions   Metformin And Related     Diarrhea   Ramipril Other (See Comments)    Cough   Tuberculin, Ppd Other (See Comments)    False positive   "

## 2024-01-29 NOTE — Assessment & Plan Note (Addendum)
 UTI Patient here with symptoms of incontinence started yesterday. UA in the office showed Lab Results  Component Value Date   COLORU yellow 05/14/2021   CLARITYU clear 05/14/2021   GLUCOSEUR negative 01/29/2024   BILIRUBINUR negative 01/29/2024   KETONESU NEGATIVE 10/01/2021   SPECGRAV 1.015 01/29/2024   RBCUR negative 01/29/2024   PHUR 6.0 01/29/2024   PROTEINUR Negative 10/01/2021   UROBILINOGEN negative (A) 01/29/2024   LEUKOCYTESUR Large (3+) (A) 01/29/2024    No suprapubic tenderness noted on exam.  She does not appear septic, has no flank tenderness.  Vitals reassuring.   Plan: Symptoms suggestive of urinary tract infection. I have prescribed Cipro  500 mg by mouth TWICE A DAY for 10 days. Your symptoms should gradually improve. Call if the burning worsens, you develop a fever, back pain or pelvic pain or if your symptom do not resolve after completing the antibiotic. Drink plenty of fluids Complete the full course of antibiotics even if the symptoms resolve Remember to wipe from front to back and don't hold it in! If possible, empty your bladder every 4 hours. Advised to watch out for any fever/chills/back pain and to go to the emergency room if she does for evaluation for pyelonephritis and IV fluids and IV antibiotics.   Hot thyroid  nodule potentially affecting urinary symptoms. ENT evaluation scheduled. - Refer to ENT specialist for evaluation and management.Orders:   POCT URINALYSIS DIP (CLINITEK)

## 2024-01-29 NOTE — Telephone Encounter (Signed)
 Left message for patient to return call.

## 2024-01-29 NOTE — Telephone Encounter (Signed)
 Appt made

## 2024-01-29 NOTE — Assessment & Plan Note (Addendum)
 Urinary Tract Infection (UTI) Confirmed UTI via urinalysis. Ciprofloxacin  chosen. Diflucan  prescribed to prevent yeast infection. - Prescribe ciprofloxacin  500 mg twice daily for 10 days. - Send urine sample for culture. - Prescribe Diflucan  if yeast infection symptoms develop. Orders:   Urine Culture   ciprofloxacin  (CIPRO ) 500 MG tablet; Take 1 tablet (500 mg total) by mouth 2 (two) times daily for 10 days.   fluconazole  (DIFLUCAN ) 150 MG tablet; Take 1 tablet (150 mg total) by mouth daily for 1 dose.

## 2024-02-01 ENCOUNTER — Ambulatory Visit: Payer: Self-pay | Admitting: Family Medicine

## 2024-02-01 LAB — URINE CULTURE

## 2024-02-03 ENCOUNTER — Other Ambulatory Visit: Payer: Self-pay | Admitting: Family Medicine

## 2024-02-03 DIAGNOSIS — E782 Mixed hyperlipidemia: Secondary | ICD-10-CM

## 2024-03-08 ENCOUNTER — Institutional Professional Consult (permissible substitution) (INDEPENDENT_AMBULATORY_CARE_PROVIDER_SITE_OTHER): Payer: Medicare (Managed Care)

## 2024-03-08 ENCOUNTER — Ambulatory Visit: Admitting: Family Medicine
